# Patient Record
Sex: Female | Born: 1950
Health system: Southern US, Community
[De-identification: ages and names within clinical notes are randomized; demographics above are authoritative.]

## PROBLEM LIST (undated history)

## (undated) DIAGNOSIS — N952 Postmenopausal atrophic vaginitis: Secondary | ICD-10-CM

## (undated) DIAGNOSIS — F419 Anxiety disorder, unspecified: Secondary | ICD-10-CM

## (undated) DIAGNOSIS — C50411 Malignant neoplasm of upper-outer quadrant of right female breast: Secondary | ICD-10-CM

## (undated) DIAGNOSIS — Z803 Family history of malignant neoplasm of breast: Secondary | ICD-10-CM

## (undated) DIAGNOSIS — E7800 Pure hypercholesterolemia, unspecified: Secondary | ICD-10-CM

## (undated) DIAGNOSIS — M8580 Other specified disorders of bone density and structure, unspecified site: Secondary | ICD-10-CM

## (undated) DIAGNOSIS — M1990 Unspecified osteoarthritis, unspecified site: Secondary | ICD-10-CM

## (undated) HISTORY — PX: OTHER SURGICAL HISTORY: SHX169

## (undated) HISTORY — DX: Pure hypercholesterolemia, unspecified: E78.00

## (undated) HISTORY — PX: KNEE SURGERY: SHX244

## (undated) HISTORY — DX: Other specified disorders of bone density and structure, unspecified site: M85.80

## (undated) HISTORY — DX: Postmenopausal atrophic vaginitis: N95.2

## (undated) HISTORY — DX: Family history of malignant neoplasm of breast: Z80.3

## (undated) HISTORY — PX: TUBAL LIGATION: SHX77

## (undated) HISTORY — DX: Unspecified osteoarthritis, unspecified site: M19.90

## (undated) HISTORY — PX: BREAST SURGERY: SHX581

## (undated) HISTORY — DX: Malignant neoplasm of upper-outer quadrant of right female breast: C50.411

---

## 1999-03-30 ENCOUNTER — Other Ambulatory Visit: Admission: RE | Admit: 1999-03-30 | Discharge: 1999-03-30 | Payer: Self-pay | Admitting: Obstetrics and Gynecology

## 2000-03-29 ENCOUNTER — Other Ambulatory Visit: Admission: RE | Admit: 2000-03-29 | Discharge: 2000-03-29 | Payer: Self-pay | Admitting: Obstetrics and Gynecology

## 2001-04-11 ENCOUNTER — Other Ambulatory Visit: Admission: RE | Admit: 2001-04-11 | Discharge: 2001-04-11 | Payer: Self-pay | Admitting: Obstetrics and Gynecology

## 2002-04-11 ENCOUNTER — Other Ambulatory Visit: Admission: RE | Admit: 2002-04-11 | Discharge: 2002-04-11 | Payer: Self-pay | Admitting: Obstetrics and Gynecology

## 2002-10-17 ENCOUNTER — Encounter: Payer: Self-pay | Admitting: Family Medicine

## 2002-10-17 ENCOUNTER — Encounter: Admission: RE | Admit: 2002-10-17 | Discharge: 2002-10-17 | Payer: Self-pay | Admitting: Family Medicine

## 2003-01-19 ENCOUNTER — Encounter: Admission: RE | Admit: 2003-01-19 | Discharge: 2003-01-19 | Payer: Self-pay | Admitting: Family Medicine

## 2003-01-19 ENCOUNTER — Encounter: Payer: Self-pay | Admitting: Family Medicine

## 2003-04-21 ENCOUNTER — Other Ambulatory Visit: Admission: RE | Admit: 2003-04-21 | Discharge: 2003-04-21 | Payer: Self-pay | Admitting: Obstetrics and Gynecology

## 2004-05-03 ENCOUNTER — Other Ambulatory Visit: Admission: RE | Admit: 2004-05-03 | Discharge: 2004-05-03 | Payer: Self-pay | Admitting: Obstetrics and Gynecology

## 2005-05-11 ENCOUNTER — Other Ambulatory Visit: Admission: RE | Admit: 2005-05-11 | Discharge: 2005-05-11 | Payer: Self-pay | Admitting: Obstetrics and Gynecology

## 2006-06-04 ENCOUNTER — Other Ambulatory Visit: Admission: RE | Admit: 2006-06-04 | Discharge: 2006-06-04 | Payer: Self-pay | Admitting: Obstetrics and Gynecology

## 2007-06-06 ENCOUNTER — Other Ambulatory Visit: Admission: RE | Admit: 2007-06-06 | Discharge: 2007-06-06 | Payer: Self-pay | Admitting: Obstetrics and Gynecology

## 2008-06-23 ENCOUNTER — Ambulatory Visit: Payer: Self-pay | Admitting: Obstetrics and Gynecology

## 2008-06-23 ENCOUNTER — Encounter: Payer: Self-pay | Admitting: Obstetrics and Gynecology

## 2008-06-23 ENCOUNTER — Other Ambulatory Visit: Admission: RE | Admit: 2008-06-23 | Discharge: 2008-06-23 | Payer: Self-pay | Admitting: Obstetrics and Gynecology

## 2009-06-24 ENCOUNTER — Other Ambulatory Visit: Admission: RE | Admit: 2009-06-24 | Discharge: 2009-06-24 | Payer: Self-pay | Admitting: Obstetrics and Gynecology

## 2009-06-24 ENCOUNTER — Ambulatory Visit: Payer: Self-pay | Admitting: Obstetrics and Gynecology

## 2010-01-27 ENCOUNTER — Ambulatory Visit: Payer: Self-pay | Admitting: Obstetrics and Gynecology

## 2010-03-14 ENCOUNTER — Other Ambulatory Visit: Admission: RE | Admit: 2010-03-14 | Discharge: 2010-03-14 | Payer: Self-pay | Admitting: Obstetrics and Gynecology

## 2010-03-14 ENCOUNTER — Ambulatory Visit: Payer: Self-pay | Admitting: Obstetrics and Gynecology

## 2011-05-03 ENCOUNTER — Other Ambulatory Visit: Payer: Self-pay | Admitting: *Deleted

## 2011-05-03 NOTE — Telephone Encounter (Signed)
Patient called requesting a refill on two meds she had been on in 2009.  The meds are Lexapro 10mg  and Ativan 0.5mg .  Was taking both daily and had gotten off of them.  Now is very stressed at work, lots of anxiety, new boss, not sleeping.  Has upcoming Annual scheduled for November.  Would like to go back on these please.  She does have an appointment with a counselor in a couple weeks too. Please advise

## 2011-05-04 MED ORDER — LORAZEPAM 0.5 MG PO TABS: 0.5000 mg | ORAL_TABLET | Freq: Three times a day (TID) | ORAL | Status: AC | PRN

## 2011-05-04 MED ORDER — ESCITALOPRAM OXALATE 10 MG PO TABS: 10.0000 mg | ORAL_TABLET | Freq: Every day | ORAL | Status: DC

## 2011-05-04 NOTE — Telephone Encounter (Signed)
It is okay to give her both.

## 2011-05-04 NOTE — Telephone Encounter (Signed)
rx's called in patient informed.

## 2011-06-06 DIAGNOSIS — N952 Postmenopausal atrophic vaginitis: Secondary | ICD-10-CM | POA: Insufficient documentation

## 2011-06-06 DIAGNOSIS — M8580 Other specified disorders of bone density and structure, unspecified site: Secondary | ICD-10-CM | POA: Insufficient documentation

## 2011-06-14 ENCOUNTER — Ambulatory Visit (INDEPENDENT_AMBULATORY_CARE_PROVIDER_SITE_OTHER): Payer: BC Managed Care – PPO | Admitting: Obstetrics and Gynecology

## 2011-06-14 ENCOUNTER — Encounter: Payer: Self-pay | Admitting: Obstetrics and Gynecology

## 2011-06-14 VITALS — BP 120/74 | Ht 65.0 in | Wt 136.0 lb

## 2011-06-14 DIAGNOSIS — Z01419 Encounter for gynecological examination (general) (routine) without abnormal findings: Secondary | ICD-10-CM

## 2011-06-14 MED ORDER — ESCITALOPRAM OXALATE 10 MG PO TABS: 10.0000 mg | ORAL_TABLET | Freq: Every day | ORAL | Status: DC

## 2011-06-14 NOTE — Progress Notes (Signed)
Patient came to see me today for her annual GYN exam. She does have atrophic vaginitis but is fine without intervention. She is osteopenia without an elevated FRAX risk. She takes calcium and vitamin D. She exercises. She's had no fractures. She's been under a lot of stress both at work and with family health issues. As a result she has lost some weight. We have started her on Lexapro and she's been on a month now. She thinks to helping. She's been able to taper her Ativan. She is not having vaginal bleeding or pelvic pain. She is fine without HRT. She is scheduled for yearly mammogram.  Physical examination:  Tina Nielsen present. HEENT within normal limits. Neck: Thyroid not large. No masses. Supraclavicular nodes: not enlarged. Breasts: Examined in both sitting midline position. No skin changes and no masses. Abdomen: Soft no guarding rebound or masses or hernia. Pelvic: External: Within normal limits. BUS: Within normal limits. Vaginal:within normal limits. poor estrogen effect. No evidence of cystocele rectocele or enterocele. Cervix: clean. Uterus: Normal size and shape. Adnexa: No masses. Rectovaginal exam: Confirmatory and negative. Extremities: Within normal limits.  Assessment: #1. Atrophic vaginitis #2. Osteopenia  Plan: Continue calcium and vitamin D. Followup bone density next year. Continue yearly mammograms.

## 2011-07-13 ENCOUNTER — Encounter: Payer: Self-pay | Admitting: Obstetrics and Gynecology

## 2011-07-22 ENCOUNTER — Other Ambulatory Visit: Payer: Self-pay | Admitting: Obstetrics and Gynecology

## 2011-07-24 NOTE — Telephone Encounter (Signed)
rx called in

## 2011-09-04 ENCOUNTER — Other Ambulatory Visit: Payer: Self-pay | Admitting: Women's Health

## 2011-09-04 NOTE — Telephone Encounter (Signed)
rx called in

## 2011-10-04 ENCOUNTER — Other Ambulatory Visit: Payer: Self-pay | Admitting: Women's Health

## 2011-10-05 NOTE — Telephone Encounter (Signed)
Phoned in to pharmacy. 

## 2011-11-06 ENCOUNTER — Other Ambulatory Visit: Payer: Self-pay | Admitting: Women's Health

## 2011-11-06 NOTE — Telephone Encounter (Signed)
Last fill date 10/05/11 and original RX Nov 2012, Dr Reece Agar

## 2011-12-04 ENCOUNTER — Ambulatory Visit (INDEPENDENT_AMBULATORY_CARE_PROVIDER_SITE_OTHER): Payer: BC Managed Care – PPO | Admitting: Obstetrics and Gynecology

## 2011-12-04 DIAGNOSIS — N63 Unspecified lump in unspecified breast: Secondary | ICD-10-CM

## 2011-12-04 NOTE — Progress Notes (Signed)
Patient came to see me today with a lump in her left breast. For 2 months now she feels something when she rolls over on her left side. Self-exam however failed to reveal masses. However on Sunday she was aware of something on breast exam.  Exam: Tina Batten Nielsen  Present. Both breasts examined in the sitting and lying position. Her left breast is clearly more lumpy than the right especially under her nipple. I do not however feel the dominant lesion.  Assessment: Possible left breast mass  Plan: Diagnostic mammal an ultrasound.

## 2011-12-05 ENCOUNTER — Telehealth: Payer: Self-pay | Admitting: *Deleted

## 2011-12-05 DIAGNOSIS — N6320 Unspecified lump in the left breast, unspecified quadrant: Secondary | ICD-10-CM

## 2011-12-05 NOTE — Telephone Encounter (Signed)
Message copied by Mckinley Jewel Bernadette Armijo L on Tue Dec 05, 2011 12:48 PM ------      Message from: Trellis Paganini      Created: Mon Dec 04, 2011  4:35 PM       Patient has a lump in her left breast below the nipple. Schedule her for a diagnostic mammos and ultrasound at [facility]. She needs a late afternoon time.

## 2011-12-05 NOTE — Telephone Encounter (Signed)
Lm for patient with information

## 2011-12-05 NOTE — Telephone Encounter (Signed)
Lm for patient to call.  Appt set up at [facility] for 12/11/11 @ 3pm. Order faxed.

## 2011-12-06 ENCOUNTER — Other Ambulatory Visit: Payer: Self-pay | Admitting: Obstetrics and Gynecology

## 2011-12-07 NOTE — Telephone Encounter (Signed)
rx called in

## 2011-12-12 ENCOUNTER — Other Ambulatory Visit: Payer: Self-pay | Admitting: Obstetrics and Gynecology

## 2011-12-12 DIAGNOSIS — N6320 Unspecified lump in the left breast, unspecified quadrant: Secondary | ICD-10-CM

## 2012-03-18 ENCOUNTER — Other Ambulatory Visit: Payer: Self-pay | Admitting: Dermatology

## 2012-06-04 ENCOUNTER — Ambulatory Visit: Payer: Self-pay | Admitting: Sports Medicine

## 2012-06-04 ENCOUNTER — Ambulatory Visit (INDEPENDENT_AMBULATORY_CARE_PROVIDER_SITE_OTHER): Payer: BC Managed Care – PPO | Admitting: Sports Medicine

## 2012-06-04 ENCOUNTER — Encounter: Payer: Self-pay | Admitting: Sports Medicine

## 2012-06-04 VITALS — BP 124/79 | HR 66 | Ht 65.0 in | Wt 135.0 lb

## 2012-06-04 DIAGNOSIS — M7660 Achilles tendinitis, unspecified leg: Secondary | ICD-10-CM

## 2012-06-04 MED ORDER — NITROGLYCERIN 0.2 MG/HR TD PT24: MEDICATED_PATCH | TRANSDERMAL | Status: DC

## 2012-06-04 NOTE — Progress Notes (Signed)
  Subjective:    Patient ID: Tina Nielsen, female    DOB: 01-Dec-1950, 61 y.o.   MRN: [id number]  HPI 61 YO female presents for evaluation of bilateral heel pain.  R heel since May and L heel since Aug.  Now L > R.  Describes a "stiff ache" near her achilles.  Worse first thing in am and after activity.  Has tried ice and NSAIDs with little relief so far.  Given Voltaren gel by Dr. Cleophas Dunker (ortho) recently.   No recent change in activity level or shoe wear.  Came to todays appointment at the recommendation of a friend. She was also given a steroid Dosepak in May that alleviated some of the pain but only for short period of time  The Voltaren gel she received a few days ago did help with her pain  PMH: Hyperlipidema Arthritis Roseca NO Diabetes, Renal or Thyroid dz  PSH: No surgery over either foot  Social:   35 pack years.  Quit. Occasional ETOH HS teacher.   Review of Systems Gen: No F/C MSK: Per HPI Neuro: No paresthesias.     Objective:   Physical Exam Gen: Alert. NAD. MSK: Noted nodularity at the mid-substance of the achilles tendon bilaterally.  Mild TTP of achilles tendon.  Slight crepitance with motion.  Near full ROM.  5/5 strength.   Neurovascular: sensation intact to light touch.  2/4 DP and PT pulses.  US findings:  The right Achilles tendon has a nodule measuring 0.9 starting at 6 cm above the calcaneus. This nodule shows marked calcific change. There are multiple neo-vessels running throughout the nodule. On transverse scans there are hypoechoic changes suggesting partial intrasubstance tears  The left Achilles tendon shows a nodule measuring 1.2 cm starting 7 cm above the calcaneus There is marked neovascular activity. There are calcific changes noted. On transverse scan there is a large midsubstance tear and other hypoechoic changes.  Note both Achilles tendons look normal at the insertion  Assessment & Plan:  1.  Bilateral Achilles Tendonopathy  Recommended NTG patch.  R/B/A discussed including HAs.  Provided compression sleeve and small heel lift.  Given eccentric exercise program.  F/u in 1 month.      Riki Rusk L. Katherine Roan, DO PGY 3 FM Resident

## 2012-06-04 NOTE — Assessment & Plan Note (Signed)
This is severe and suggests that she is at higher risk of Achilles tendon rupture  Am not sure of the etiologic factor but I have some concern that maybe her use of atorvastatin contributed  Try compression sleeve on one ankle and see if this helps. If so add to the second ankle.  Heel lifts. Nitroglycerin protocol.  Gentle exercise program but do it twice daily.  Recheck in one month

## 2012-06-04 NOTE — Patient Instructions (Addendum)
Nitroglycerin Protocol   Apply 1/4 nitroglycerin patch to each achilles tendon daily.  Change position of patch within the affected area every 24 hours.  You may experience a headache during the first 1-2 weeks of using the patch, these should subside.  If you experience headaches after beginning nitroglycerin patch treatment, you may take your preferred over the counter pain reliever.  Another side effect of the nitroglycerin patch is skin irritation or rash related to patch adhesive.  Please notify our office if you develop more severe headaches or rash, and stop the patch.  Tendon healing with nitroglycerin patch may require 12 to 24 weeks depending on the extent of injury.  Men should not use if taking Viagra, Cialis, or Levitra.   Do not use if you have migraines or rosacea.   Please do suggested exercises daily  Please follow up in 1 month  Thank you for seeing [place] today!

## 2012-06-14 ENCOUNTER — Other Ambulatory Visit: Payer: Self-pay | Admitting: Obstetrics and Gynecology

## 2012-06-20 ENCOUNTER — Encounter: Payer: Self-pay | Admitting: Obstetrics and Gynecology

## 2012-06-20 ENCOUNTER — Ambulatory Visit (INDEPENDENT_AMBULATORY_CARE_PROVIDER_SITE_OTHER): Payer: BC Managed Care – PPO | Admitting: Obstetrics and Gynecology

## 2012-06-20 ENCOUNTER — Other Ambulatory Visit (HOSPITAL_COMMUNITY)
Admission: RE | Admit: 2012-06-20 | Discharge: 2012-06-20 | Disposition: A | Discharge status: Home / Self Care | Payer: BC Managed Care – PPO | Source: Ambulatory Visit | Attending: Obstetrics and Gynecology | Admitting: Obstetrics and Gynecology

## 2012-06-20 VITALS — BP 106/70 | Ht 65.0 in | Wt 136.0 lb

## 2012-06-20 DIAGNOSIS — Z01419 Encounter for gynecological examination (general) (routine) without abnormal findings: Secondary | ICD-10-CM

## 2012-06-20 NOTE — Patient Instructions (Addendum)
Call cancer center and inqjuire about BRAC-1 and BRAc-2 testing for mother. Get 3-D mammogram

## 2012-06-20 NOTE — Progress Notes (Signed)
Patient came to see me today for her annual GYN exam. She is doing well without HRT. She does have some anxiety but feels it is work related. She does her lab through her internist. She is due for followup bone density. She has osteopenia without  elevated FRAX risk. She has had no fractures.She is having no vaginal bleeding. She is having no pelvic pain. She does have vaginal dryness but is currently not sexually active and does not need treatment. She is due for her mammogram in December. She does have dense breast tissue. Her mother had breast cancer at age 26. She is still alive and older than 40. There is no other family history of breast or ovarian cancer.  Physical examination:Tina Nielsen present. HEENT within normal limits. Neck: Thyroid not large. No masses. Supraclavicular nodes: not enlarged. Breasts: Examined in both sitting and lying  position. No skin changes and no masses. Abdomen: Soft no guarding rebound or masses or hernia. Pelvic: External: Within normal limits. BUS: Within normal limits. Vaginal:within normal limits. Poor  estrogen effect. No evidence of cystocele  or enterocele. First-degree rectocele.Cervix: clean. Uterus: Normal size and shape. Adnexa: No masses. Rectovaginal exam: Confirmatory and negative. Extremities: Within normal limits.  Assessment: #1. Atrophic vaginitis #2. Small asymptomatic rectocele #3. Dense breast tissue. #4. Osteopenia  Plan: Mammogram-3D. Bone density. Patient to inquire at cancer Center regarding mother been tested for BRCA1 and BRCA2. Pap done.The new Pap smear guidelines were discussed with the patient.

## 2012-06-21 LAB — URINALYSIS W MICROSCOPIC + REFLEX CULTURE
Casts: NONE SEEN
Crystals: NONE SEEN
Glucose, UA: NEGATIVE mg/dL
Leukocytes, UA: NEGATIVE
Squamous Epithelial / LPF: NONE SEEN
pH: 6 (ref 5.0–8.0)

## 2012-07-03 ENCOUNTER — Ambulatory Visit (INDEPENDENT_AMBULATORY_CARE_PROVIDER_SITE_OTHER): Payer: BC Managed Care – PPO | Admitting: Sports Medicine

## 2012-07-03 VITALS — BP 106/64 | Ht 65.0 in | Wt 140.0 lb

## 2012-07-03 DIAGNOSIS — M7660 Achilles tendinitis, unspecified leg: Secondary | ICD-10-CM

## 2012-07-03 NOTE — Patient Instructions (Signed)
Suggested exercises 1. Heel walking back and forth for 12-15 steps 5x at least twice a day 2. Stair heel raises with leg straight and then bent 15 reps and 3 sets. Do not do anything that causes you pain.   Continue Nitroglycerin Protocol   Apply 1/4 nitroglycerin patch to each achilles tendon daily.  Change position of patch within the affected area every 24 hours.  You may experience a headache during the first 1-2 weeks of using the patch, these should subside.  If you experience headaches after beginning nitroglycerin patch treatment, you may take your preferred over the counter pain reliever.  Another side effect of the nitroglycerin patch is skin irritation or rash related to patch adhesive.  Please notify our office if you develop more severe headaches or rash, and stop the patch.  Tendon healing with nitroglycerin patch may require 12 to 24 weeks depending on the extent of injury.  Men should not use if taking Viagra, Cialis, or Levitra.   Do not use if you have migraines or rosacea.   Please follow up in 2 months or sooner if pain were to worsen or not continue to improve.   Thank you for seeing Tina Nielsen today!

## 2012-07-03 NOTE — Progress Notes (Signed)
  Subjective:    Patient ID: Tina Nielsen, female    DOB: October 26, 1950, 61 y.o.   MRN: [id number]  HPI 61 YO female presents for follow up of bilateral heel pain.  Patient diagnosed with achilles tendonopathy with partial tears on bilateral achilles tendons. Patient seen 4 weeks ago and started on NTG protocol with eccentric exercises. Could not tolerate NTG due to HA and reverted back to Voltaren which did not help pain. 5 days ago patient started NTG again in AM and states that she has only had some nausea but no HA and has been able to tolerate. In the last week since starting NTG, pain has improved by 75%. Patient excited about results. She also does intermittent aleve as well as ice baths occasionally. Patient admits only intermittently compliant with exercises.   PMH: 7 pack years.  Quit. Review of Systems-see HPI     Objective:   Physical Exam Gen: Alert. NAD. MSK: Noted nodularity at the mid-substance of the achilles tendon bilaterally.  Mild TTP of achilles tendon bilatearlly.  Slight crepitance with motion.  Full ROM.  5/5 strength.   Neurovascular: sensation intact to light touch.  2/4 DP and PT pulses.  US findings:  The right Achilles tendon has a nodule measuring 0.76 improved from 0.9 at last reading starting at 6 cm above the calcaneus. This nodule shows marked calcific change. Neo-vessels still present but minimal in comparison to last imaging.  On transverse scans there are hypoechoic changes suggesting partial intrasubstance tears-appear to be smaller/healing  The left Achilles tendon shows a nodule measuring 1.09 cm down from 1.2 cm starting 7 cm above the calcaneus. Continues to have neovascular activity but less in comparison to old study. There are calcific changes noted. On transverse scan there is a large midsubstance tear and other hypoechoic changes which appears smaller than last visit/healing.   Please note both Achilles tendons look normal at the insertion    Assessment & Plan:  1.  Bilateral Achilles Tendonopathy Healing with NTG therapy. Will continue therapy and eccentric exercises (heel lifts including intoeing and toe walking) . Patient to continue ankle sleeve. Gave patient 2 extra pair of heel lifts as these are helping. Patient will plan to follow up in 2 months as long as she continues to improve and does not worsen.

## 2012-07-25 ENCOUNTER — Encounter: Payer: Self-pay | Admitting: Obstetrics and Gynecology

## 2012-09-03 ENCOUNTER — Ambulatory Visit (INDEPENDENT_AMBULATORY_CARE_PROVIDER_SITE_OTHER): Payer: 59 | Admitting: Sports Medicine

## 2012-09-03 VITALS — BP 110/70 | Ht 65.0 in | Wt 140.0 lb

## 2012-09-03 DIAGNOSIS — M7660 Achilles tendinitis, unspecified leg: Secondary | ICD-10-CM

## 2012-09-03 NOTE — Progress Notes (Signed)
Patient ID: Tina Nielsen, female   DOB: 1950/11/11, 62 y.o.   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 She is able to do the exercises mostly on one leg without any significant pain She has not had side effects from the nitroglycerin  Physical examination Right Achilles tendon still shows a nodule but is much smaller than before and seems relatively nontender There is good motion of the ankle dorsi and plantar flexion  Left Achilles tendon reveals a substantial nodule starting about 3 cm above the calcaneus and extending to about 7 cm This is no longer painful to touch I do not see any reduction in size   Musculoskeletal ultrasound The right Achilles tendon continues to show progress in the nodule has now reduced to about 0.69 cm thick There are no tears seen on longitudinal or transverse scan There is only very mild increase in Doppler activity  The left Achilles tendon shows a very thick nodule This still measures 1.3 cm at its greatest thickness There is a marked increase in Doppler activity throughout the nodule On transverse and longitudinal scans there are areas of splitting within the tendon and some intratendinous tears

## 2012-09-03 NOTE — Assessment & Plan Note (Signed)
Clinically she continues to make very nice progress she has a long way to go to heal the left Achilles  I suggested continuing the exercises faithfully  Working to get these to her she is very comfortable on 1 leg  Continue on the nitroglycerin and I suspect she will need to use this for 90 months to-year-old left Achilles  We will recheck in 3 months and by that time may be able to stop this on the right Achilles  Return sooner if any problems

## 2012-10-07 ENCOUNTER — Other Ambulatory Visit: Payer: Self-pay | Admitting: *Deleted

## 2012-10-07 MED ORDER — NITROGLYCERIN 0.2 MG/HR TD PT24: MEDICATED_PATCH | TRANSDERMAL | Status: DC

## 2012-12-03 ENCOUNTER — Ambulatory Visit (INDEPENDENT_AMBULATORY_CARE_PROVIDER_SITE_OTHER): Payer: 59 | Admitting: Sports Medicine

## 2012-12-03 ENCOUNTER — Encounter: Payer: Self-pay | Admitting: Sports Medicine

## 2012-12-03 VITALS — BP 138/85 | HR 63 | Ht 65.0 in | Wt 140.0 lb

## 2012-12-03 DIAGNOSIS — M7660 Achilles tendinitis, unspecified leg: Secondary | ICD-10-CM

## 2012-12-03 NOTE — Assessment & Plan Note (Signed)
She seems to be gradually healing both Achilles tendons  The right Achilles she can probably stop nitroglycerin within another month  The left Achilles tendon she can increase to one half patch I suspect the left Achilles tendon will take a year or more to resolve  Keep up the exercises at least once a day  Recheck in 2 months and probable ultrasound at that time

## 2012-12-03 NOTE — Progress Notes (Signed)
  Subjective:    Patient ID: Maryann Conners, female    DOB: Jun 30, 1951, 62 y.o.   MRN: [id number]  HPI  Pt presents to clinic for f/u of bilat achilles tendinitis. Rt feels resolved, left much improved. Continues to use 1/4 NTG patch on both side. Does home exercises consistently. Only has slight pain with lt AT now.    Review of Systems     Objective:   Physical Exam No acute distress, thin white female  Lt AT  nodule slightly tender to squeeze 3-7 cm above calcaneus - thickened nodular AT- not red or warm, does not move Still about 1 cm thick Motion at ankle good Slight swelling around distal soleus  Rt AT Nodule down to almost normal- start 4 cm above calcaneus, moves easily Motion at ankle good  Able to do toe and heel walking without pain      Assessment & Plan:

## 2012-12-03 NOTE — Patient Instructions (Addendum)
Increase to 1/2 patch on left achilles nodule  Continue 1/4 patch on rt achilles   Continue home exercises   Please follow up in 2 months  Thank you for seeing [place] today!

## 2013-01-23 ENCOUNTER — Other Ambulatory Visit: Payer: Self-pay | Admitting: *Deleted

## 2013-01-23 MED ORDER — NITROGLYCERIN 0.2 MG/HR TD PT24: MEDICATED_PATCH | TRANSDERMAL | Status: DC

## 2013-04-01 ENCOUNTER — Ambulatory Visit (INDEPENDENT_AMBULATORY_CARE_PROVIDER_SITE_OTHER): Payer: 59 | Admitting: Sports Medicine

## 2013-04-01 ENCOUNTER — Encounter: Payer: Self-pay | Admitting: Sports Medicine

## 2013-04-01 VITALS — BP 105/67 | HR 61 | Ht 65.0 in | Wt 140.0 lb

## 2013-04-01 DIAGNOSIS — M7660 Achilles tendinitis, unspecified leg: Secondary | ICD-10-CM

## 2013-04-01 MED ORDER — NITROGLYCERIN 0.2 MG/HR TD PT24: MEDICATED_PATCH | TRANSDERMAL | Status: DC

## 2013-04-01 NOTE — Assessment & Plan Note (Signed)
Pt continues to make progress, almost nml appearing achilles tendon on the R side.  She can stop the nitro patch on this side.  On the left side, minimal TTP and has been making improvement as US shows 0.97 at thickest point which is a decrease from 1.3 on last scan.  Will continue the 1/2 patch of NTG and recheck in three months.  Continue with eccentric exercises and mild heel lift to unload the achilles.  The L side may well take over a yr to heal.

## 2013-04-01 NOTE — Progress Notes (Signed)
Tina Nielsen is a 62 y.o. female who presents today for f/u of her B/L achilles calcific tendonopathy.  Pt was started on the NTG protocol about 7-8 months ago and her last f/u was on 12/03/12.  She was continued on 1/2 patch B/L at that time to each achilles and at this point states that her pain is very minimal.  She has intermittent pain at the left achilles, close to the insertion, and intermittent pain with walking when pushing off.  She denies dull achy pain to the area, swelling, erythema, and she has been compliant with her achilles eccentric stretching exercises and the nitro patches.   ROS: Per HPI.  All other systems reviewed and are negative.   Physical Exam Filed Vitals:   04/01/13 1610  BP: 105/67  Pulse: 61    Physical Examination:  No acute distress  Lt AT  Mild nodule slightly above calcaneal insertion 3-7 cm, no erythematous, minimal TTP at this region, ROM nml, MS 5/5, Thompson test neg  Rt AT  Minimal nodule around 4 cm above calcaneus, moves easily. No TTP, ROM nml, MS 5/5, Thompson test neg  Able to do toe and heel walking without pain. Neurovascular intact    MSK [place]  Rt achilles Long - Calcifications seen throughout tendon, w/o hypoechoic areas noted, no discrete tears. 0.49 cm thick around insertion.  0.70 cm at thickest point Transverse - Calcifications as above There is only very mild increase in Doppler activity   Lt achilles Long - Large calcified area 2 cm proximal to insertion w/ other areas of calcification noted.  No discrete tears.  0.97 cm thickest point Transverse -  there are areas of splitting within the tendon and some intratendinous tears along with fusiform appearance, minimal increase in doppler activity

## 2013-05-01 ENCOUNTER — Encounter: Payer: Self-pay | Admitting: Internal Medicine

## 2013-05-20 ENCOUNTER — Encounter: Payer: Self-pay | Admitting: Internal Medicine

## 2013-07-02 ENCOUNTER — Encounter (INDEPENDENT_AMBULATORY_CARE_PROVIDER_SITE_OTHER): Payer: Self-pay

## 2013-07-02 ENCOUNTER — Ambulatory Visit (INDEPENDENT_AMBULATORY_CARE_PROVIDER_SITE_OTHER): Payer: 59 | Admitting: Sports Medicine

## 2013-07-02 ENCOUNTER — Encounter: Payer: Self-pay | Admitting: Sports Medicine

## 2013-07-02 VITALS — BP 111/75 | HR 65 | Ht 65.0 in | Wt 140.0 lb

## 2013-07-02 DIAGNOSIS — M7660 Achilles tendinitis, unspecified leg: Secondary | ICD-10-CM

## 2013-07-02 NOTE — Assessment & Plan Note (Signed)
Continued improvement with tendon issues now only on left See plan

## 2013-07-02 NOTE — Progress Notes (Signed)
Patient ID: Tina Nielsen, female   DOB: 11-20-1950, 62 y.o.   MRN: [id number]  SUBJECTIVE: Patient is a 62 yo female who presents today for follow up bilateral L>R achilles tendinitis What has been treating this condition with Nitroglycerin patch on both side for several months 9 month on the Right achilles and currently 13 months on the left. 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 She continue to wear a heel lift and has been continuing her home exercise program daily  The right-sided nodule had improved in appearance The left-sided nodule was still large but not as TTP She has not had side effects from the nitroglycerin   ROS: negative except what is present in HPI  Physical examination: BP 111/75, HR 65, wt  Neuro: A&O x3.  CN 2-12 grossly intact.  Sensation to light touch lower extremities equal and intact bilaterally.  Right foot dominate Normal gait Foot/Ankle Exam:  observation - no ecchymosis, no edema, or hematoma present over the anterior, lateral or posterior soft tissue surrounding the ankle and midfoot Palpation: No tenderness to palpation over the ATF, CF, or PTF ligaments.  No tenderness to palpation over the deltoid ligaments both anterior lateral and posterior portions.  No tenderness to palpation over the lateral or medial epicondyles.  No tenderness over the syndesmosis.  Right Achilles tendon still shows a nodule but is much smaller than before and seems relatively non-tender.  Left Achilles tendon reveals a thick nodule starting about 3 cm above the calcaneus  This is no longer painful to touch There is good motion of the ankle dorsi and plantar flexion. Strength 5/5 on plantar flexion, dorsi flexion inversion and eversion, bilateral  Musculoskeletal ultrasound The left Achilles tendon shows a very thick nodule Reduction from 0.97 to 0.76 cm at its greatest thickness There is less increase in  Doppler activity throughout the nodule Continue calcific changes within the tendon but less tear of the tendon fibers On transverse and longitudinal scans there are areas of splitting within the tendon and some intratendinous tears  ASSESSMENT/PLAN: 1. Bilateral Left great than Right Achilles tendinitis with left sided calcific changes seen on [place]  Continue Nitroglycerin protocol on the left achilles f/u in 3-4 months goal is for the thickness of the left tendon to reduce down to a normal upper range of 0.6 cm

## 2013-07-14 ENCOUNTER — Ambulatory Visit (AMBULATORY_SURGERY_CENTER): Payer: 59 | Admitting: *Deleted

## 2013-07-14 VITALS — Ht 65.0 in | Wt 144.0 lb

## 2013-07-14 DIAGNOSIS — Z1211 Encounter for screening for malignant neoplasm of colon: Secondary | ICD-10-CM

## 2013-07-14 MED ORDER — MOVIPREP 100 G PO SOLR: ORAL | Status: DC

## 2013-07-14 NOTE — Progress Notes (Signed)
Patient denies any allergies to eggs or soy. Patient denies any problems with anesthesia.  

## 2013-07-16 ENCOUNTER — Encounter: Payer: Self-pay | Admitting: Internal Medicine

## 2013-07-28 ENCOUNTER — Ambulatory Visit (AMBULATORY_SURGERY_CENTER): Payer: 59 | Admitting: Internal Medicine

## 2013-07-28 ENCOUNTER — Encounter: Payer: Self-pay | Admitting: Internal Medicine

## 2013-07-28 VITALS — BP 133/60 | HR 52 | Temp 98.0°F | Resp 22 | Ht 65.0 in | Wt 144.0 lb

## 2013-07-28 DIAGNOSIS — Z1211 Encounter for screening for malignant neoplasm of colon: Secondary | ICD-10-CM

## 2013-07-28 MED ORDER — SODIUM CHLORIDE 0.9 % IV SOLN: 500.0000 mL | INTRAVENOUS | Status: DC

## 2013-07-28 NOTE — Progress Notes (Signed)
No complaints noted in the recovery room. Maw   

## 2013-07-28 NOTE — Progress Notes (Signed)
Procedure ends, to recovery, report given and VSS. 

## 2013-07-28 NOTE — Patient Instructions (Signed)
YOU HAD AN ENDOSCOPIC PROCEDURE TODAY AT THE [facility]: Refer to the procedure report that was given to you for any specific questions about what was found during the examination.  If the procedure report does not answer your questions, please call your gastroenterologist to clarify.  If you requested that your care partner not be given the details of your procedure findings, then the procedure report has been included in a sealed envelope for you to review at your convenience later.  YOU SHOULD EXPECT: Some feelings of bloating in the abdomen. Passage of more gas than usual.  Walking can help get rid of the air that was put into your GI tract during the procedure and reduce the bloating. If you had a lower endoscopy (such as a colonoscopy or flexible sigmoidoscopy) you may notice spotting of blood in your stool or on the toilet paper. If you underwent a bowel prep for your procedure, then you may not have a normal bowel movement for a few days.  DIET: Your first meal following the procedure should be a light meal and then it is ok to progress to your normal diet.  A half-sandwich or bowl of soup is an example of a good first meal.  Heavy or fried foods are harder to digest and may make you feel nauseous or bloated.  Likewise meals heavy in dairy and vegetables can cause extra gas to form and this can also increase the bloating.  Drink plenty of fluids but you should avoid alcoholic beverages for 24 hours.  ACTIVITY: Your care partner should take you home directly after the procedure.  You should plan to take it easy, moving slowly for the rest of the day.  You can resume normal activity the day after the procedure however you should NOT DRIVE or use heavy machinery for 24 hours (because of the sedation medicines used during the test).    SYMPTOMS TO REPORT IMMEDIATELY: A gastroenterologist can be reached at any hour.  During normal business hours, 8:30 AM to 5:00 PM Monday through Friday,  call [phone].  After hours and on weekends, please call the GI answering service at [phone] who will take a message and have the physician on call contact you.   Following lower endoscopy (colonoscopy or flexible sigmoidoscopy):  Excessive amounts of blood in the stool  Significant tenderness or worsening of abdominal pains  Swelling of the abdomen that is new, acute  Fever of 100F or higher    FOLLOW UP: If any biopsies were taken you will be contacted by phone or by letter within the next 1-3 weeks.  Call your gastroenterologist if you have not heard about the biopsies in 3 weeks.  Our staff will call the home number listed on your records the next business day following your procedure to check on you and address any questions or concerns that you may have at that time regarding the information given to you following your procedure. This is a courtesy call and so if there is no answer at the home number and we have not heard from you through the emergency physician on call, we will assume that you have returned to your regular daily activities without incident.  SIGNATURES/CONFIDENTIALITY: You and/or your care partner have signed paperwork which will be entered into your electronic medical record.  These signatures attest to the fact that that the information above on your After Visit Summary has been reviewed and is understood.  Full responsibility of the confidentiality   of this discharge information lies with you and/or your care-partner.   Normal colonoscopy, repeat in 10 years-2024  High fiber diet.

## 2013-07-28 NOTE — Op Note (Signed)
[facility] [address] [place], [address]   COLONOSCOPY PROCEDURE REPORT  PATIENT: Tina Nielsen, Tina Nielsen  MR#: [id number] BIRTHDATE: Sep 26, 1950 , 62  yrs. old GENDER: Female ENDOSCOPIST: Hart Carwin, MD REFERRED XB:JYNWGN Hyman Hopes, M.D. ,Dr Clyde Canterbury PROCEDURE DATE:  07/28/2013 PROCEDURE:   Colonoscopy, screening First Screening Colonoscopy - Avg.  risk and is 50 yrs.  old or older - No.  Prior Negative Screening - Now for repeat screening. 10 or more years since last screening  History of Adenoma - Now for follow-up colonoscopy & has been > or = to 3 yrs.  N/A  Polyps Removed Today? No.  Recommend repeat exam, <10 yrs? No. ASA CLASS:   Class II INDICATIONS:Average risk patient for colon cancer and last colonoscopy December 2004 was normal. MEDICATIONS: [name] sedation, administered by CRNA and Propofol (Diprivan) 270 mg IV  DESCRIPTION OF PROCEDURE:   After the risks benefits and alternatives of the procedure were thoroughly explained, informed consent was obtained.  A digital rectal exam revealed no abnormalities of the rectum.   The LB [id number]-H190 [id number]  endoscope was introduced through the anus and advanced to the cecum, which was identified by both the appendix and ileocecal valve. No adverse events experienced.   The quality of the prep was good, using MoviPrep  The instrument was then slowly withdrawn as the colon was fully examined.      COLON FINDINGS: A normal appearing cecum, ileocecal valve, and appendiceal orifice were identified.  The ascending, hepatic flexure, transverse, splenic flexure, descending, sigmoid colon and rectum appeared unremarkable.  No polyps or cancers were seen. Retroflexed views revealed no abnormalities. The time to cecum=9 minutes 41 seconds.  Withdrawal time=7 minutes 04 seconds.  The scope was withdrawn and the procedure completed. COMPLICATIONS: There were no complications.  ENDOSCOPIC IMPRESSION: Normal  colon  RECOMMENDATIONS: high-fiber diet Recall colonoscopy in 10 years   eSigned:  Hart Carwin, MD 07/28/2013 12:24 PM   cc:   PATIENT NAME:  Egan, Sahlin MR#: [id number]

## 2013-07-29 ENCOUNTER — Telehealth: Payer: Self-pay | Admitting: *Deleted

## 2013-07-29 NOTE — Telephone Encounter (Signed)
No answer, left message to call if questions or concerns. 

## 2013-11-25 ENCOUNTER — Ambulatory Visit (INDEPENDENT_AMBULATORY_CARE_PROVIDER_SITE_OTHER): Payer: 59 | Admitting: Sports Medicine

## 2013-11-25 ENCOUNTER — Encounter: Payer: Self-pay | Admitting: Sports Medicine

## 2013-11-25 VITALS — BP 119/81 | Ht 65.0 in | Wt 140.0 lb

## 2013-11-25 DIAGNOSIS — M7660 Achilles tendinitis, unspecified leg: Secondary | ICD-10-CM

## 2013-11-25 MED ORDER — NITROGLYCERIN 0.2 MG/HR TD PT24: MEDICATED_PATCH | TRANSDERMAL | Status: DC

## 2013-11-25 NOTE — Assessment & Plan Note (Signed)
B. chronic Achilles problems are probably improved as much as they will with the current therapy  She is given a protocol to wean the nitroglycerin over the next 3 months  Since she has not done much rehabilitation we will try gentle walking uphill about 10 repeats on a daily basis  After 3 months we should rescan this to see how much residual scar tissue she may have in the left Achilles

## 2013-11-25 NOTE — Patient Instructions (Signed)
Start NTG wean over next 3 months  1/4 patch M to Sunday wks 1/2 Then M to Friday wks 3 and 4 Then M/ W / Th Sat wks 5 and 6 Then M/W/F weeks 7 and 8 Then M/ Th weeks 9 and 10 Then only if sore on weeks 11 and 12 Then Stop  For Home exercise Find a gentle slope and walk up 10 times at least 3 to 4 days per week  Recheck 3 months

## 2013-11-25 NOTE — Progress Notes (Signed)
Patient ID: Tina Nielsen, female   DOB: September 03, 1950, 63 y.o.   MRN: [id number]  Chronic Achilles tedinopathy past 18 mos This has improved on a nitroglycerin protocol She has had trouble doing the exercises because they hurt her forefoot and she has osteopenia  Fast walking Toe walking These both still trigger pain  Since last visit she's had no real painful days She still feels some thickness particularly on the left Achilles However, this is no longer tender to squeeze or when she gets to the end of the day  Physical examination Thin and in no acute distress BP 119/81  Ht 5\' 5"  (1.651 m)  Wt 140 lb (63.504 kg)  BMI 23.30 kg/m2  Examination reveals a moderately thick nodule 3-6 cm above the calcaneal insertion of the left Achilles No swelling No tenderness to squeeze or palpation  Ankle motion is normal bilaterally  Right Achilles shows a smaller nodular area 3-6 cm above the calcaneus that is nontender

## 2014-02-10 ENCOUNTER — Encounter: Payer: Self-pay | Admitting: Women's Health

## 2014-02-10 ENCOUNTER — Ambulatory Visit: Payer: 59 | Admitting: Sports Medicine

## 2014-02-10 ENCOUNTER — Other Ambulatory Visit (HOSPITAL_COMMUNITY)
Admission: RE | Admit: 2014-02-10 | Discharge: 2014-02-10 | Disposition: A | Discharge status: Home / Self Care | Payer: 59 | Source: Ambulatory Visit | Attending: Gynecology | Admitting: Gynecology

## 2014-02-10 ENCOUNTER — Ambulatory Visit (INDEPENDENT_AMBULATORY_CARE_PROVIDER_SITE_OTHER): Payer: 59 | Admitting: Women's Health

## 2014-02-10 VITALS — BP 120/70 | Ht 66.0 in | Wt 140.0 lb

## 2014-02-10 DIAGNOSIS — Z01419 Encounter for gynecological examination (general) (routine) without abnormal findings: Secondary | ICD-10-CM

## 2014-02-10 LAB — URINALYSIS W MICROSCOPIC + REFLEX CULTURE
BILIRUBIN URINE: NEGATIVE
Bacteria, UA: NONE SEEN
CASTS: NONE SEEN
Crystals: NONE SEEN
GLUCOSE, UA: NEGATIVE mg/dL
Hgb urine dipstick: NEGATIVE
KETONES UR: NEGATIVE mg/dL
Leukocytes, UA: NEGATIVE
Nitrite: NEGATIVE
PH: 5.5 (ref 5.0–8.0)
PROTEIN: NEGATIVE mg/dL
Specific Gravity, Urine: 1.022 (ref 1.005–1.030)
Squamous Epithelial / LPF: NONE SEEN
Urobilinogen, UA: 0.2 mg/dL (ref 0.0–1.0)

## 2014-02-10 NOTE — Progress Notes (Signed)
CATALINA SALASAR 03/03/1951 [id number]    History:    Presents for annual exam.  Postmenopausal/no HRT/no bleeding. Osteopenic 2011 DEXA T score -1.8 at the hip no elevated FRAX, normal dexa at health screening at work 07/2013. Labs at primary care, up-to-date on immunizations. Not sexually active. Normal mammogram and pap history. Mother breast cancer age 64 has had twice living age 22. Normal colonoscopy 07/2013.  Past medical history, past surgical history, family history and social history were all reviewed and documented in the EPIC chart. 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 [place]  ROS:  A  12 point ROS was performed and pertinent positives and negatives are included.  Exam:  Filed Vitals:   02/10/14 0836  BP: 120/70    General appearance:  Normal Thyroid:  Symmetrical, normal in size, without palpable masses or nodularity. Respiratory  Auscultation:  Clear without wheezing or rhonchi Cardiovascular  Auscultation:  Regular rate, without rubs, murmurs or gallops  Edema/varicosities:  Not grossly evident Abdominal  Soft,nontender, without masses, guarding or rebound.  Liver/spleen:  No organomegaly noted  Hernia:  None appreciated  Skin  Inspection:  Grossly normal   Breasts: Examined lying and sitting.     Right: Without masses, retractions, discharge or axillary adenopathy.     Left: Without masses, retractions, discharge or axillary adenopathy. Gentitourinary   Inguinal/mons:  Normal without inguinal adenopathy  External genitalia:  Normal  BUS/Urethra/Skene's glands:  Normal  Vagina:  +1 asymptomatic cystocele  Cervix:  Normal  Uterus:   normal in size, shape and contour.  Midline and mobile  Adnexa/parametria:     Rt: Without masses or tenderness.   Lt: Without masses or tenderness.  Anus and perineum: Normal  Digital rectal exam: Normal sphincter tone without palpated masses or tenderness  Assessment/Plan:  63 y.o.DWF G2P2  for annual exam  with no complaints.  Postmenopausal/no HRT/no bleeding Osteopenia with followup normal DEXA 2014 Labs-primary care  Plan: SBE's, annual mammogram, 3D tomography reviewed, encouraged history of dense breast. Continue regular exercise, active lifestyle, calcium rich diet, vitamin D 2000 daily. Reports normal vitamin D at primary care. Pap, Pap normal 2013, he screening guidelines reviewed. Home safety, fall prevention and importance of regular exercise discussed.   Note: This dictation was prepared with Dragon/digital dictation.  Any transcriptional errors that result are unintentional. [facility], 9:11 AM 02/10/2014

## 2014-02-10 NOTE — Patient Instructions (Signed)
Health Recommendations for Postmenopausal Women Respected and ongoing research has looked at the most common causes of death, disability, and poor quality of life in postmenopausal women. The causes include heart disease, diseases of blood vessels, diabetes, depression, cancer, and bone loss (osteoporosis). Many things can be done to help lower the chances of developing these and other common problems: CARDIOVASCULAR DISEASE Heart Disease: A heart attack is a medical emergency. Know the signs and symptoms of a heart attack. Below are things women can do to reduce their risk for heart disease.   Do not smoke. If you smoke, quit.  Aim for a healthy weight. Being overweight causes many preventable deaths. Eat a healthy and balanced diet and drink an adequate amount of liquids.  Get moving. Make a commitment to be more physically active. Aim for 30 minutes of activity on most, if not all days of the week.  Eat for heart health. Choose a diet that is low in saturated fat and cholesterol and eliminate trans fat. Include whole grains, vegetables, and fruits. Read and understand the labels on food containers before buying.  Know your numbers. Ask your caregiver to check your blood pressure, cholesterol (total, HDL, LDL, triglycerides) and blood glucose. Work with your caregiver on improving your entire clinical picture.  High blood pressure. Limit or stop your table salt intake (try salt substitute and food seasonings). Avoid salty foods and drinks. Read labels on food containers before buying. Eating well and exercising can help control high blood pressure. STROKE  Stroke is a medical emergency. Stroke may be the result of a blood clot in a blood vessel in the brain or by a brain hemorrhage (bleeding). Know the signs and symptoms of a stroke. To lower the risk of developing a stroke:  Avoid fatty foods.  Quit smoking.  Control your diabetes, blood pressure, and irregular heart rate. THROMBOPHLEBITIS  (BLOOD CLOT) OF THE LEG  Becoming overweight and leading a stationary lifestyle may also contribute to developing blood clots. Controlling your diet and exercising will help lower the risk of developing blood clots. CANCER SCREENING  Breast Cancer: Take steps to reduce your risk of breast cancer.  You should practice "breast self-awareness." This means understanding the normal appearance and feel of your breasts and should include breast self-examination. Any changes detected, no matter how small, should be reported to your caregiver.  After age 40, you should have a clinical breast exam (CBE) every year.  Starting at age 40, you should consider having a mammogram (breast X-ray) every year.  If you have a family history of breast cancer, talk to your caregiver about genetic screening.  If you are at high risk for breast cancer, talk to your caregiver about having an MRI and a mammogram every year.  Intestinal or Stomach Cancer: Tests to consider are a rectal exam, fecal occult blood, sigmoidoscopy, and colonoscopy. Women who are high risk may need to be screened at an earlier age and more often.  Cervical Cancer:  Beginning at age 30, you should have a Pap test every 3 years as long as the past 3 Pap tests have been normal.  If you have had past treatment for cervical cancer or a condition that could lead to cancer, you need Pap tests and screening for cancer for at least 20 years after your treatment.  If you had a hysterectomy for a problem that was not cancer or a condition that could lead to cancer, then you no longer need Pap tests.    If you are between ages 65 and 70, and you have had normal Pap tests going back 10 years, you no longer need Pap tests.  If Pap tests have been discontinued, risk factors (such as a new sexual partner) need to be reassessed to determine if screening should be resumed.  Some medical problems can increase the chance of getting cervical cancer. In these  cases, your caregiver may recommend more frequent screening and Pap tests.  Uterine Cancer: If you have vaginal bleeding after reaching menopause, you should notify your caregiver.  Ovarian cancer: Other than yearly pelvic exams, there are no reliable tests available to screen for ovarian cancer at this time except for yearly pelvic exams.  Lung Cancer: Yearly chest X-rays can detect lung cancer and should be done on high risk women, such as cigarette smokers and women with chronic lung disease (emphysema).  Skin Cancer: A complete body skin exam should be done at your yearly examination. Avoid overexposure to the sun and ultraviolet light lamps. Use a strong sun block cream when in the sun. All of these things are important in lowering the risk of skin cancer. MENOPAUSE Menopause Symptoms: Hormone therapy products are effective for treating symptoms associated with menopause:  Moderate to severe hot flashes.  Night sweats.  Mood swings.  Headaches.  Tiredness.  Loss of sex drive.  Insomnia.  Other symptoms. Hormone replacement carries certain risks, especially in older women. Women who use or are thinking about using estrogen or estrogen with progestin treatments should discuss that with their caregiver. Your caregiver will help you understand the benefits and risks. The ideal dose of hormone replacement therapy is not known. The Food and Drug Administration (FDA) has concluded that hormone therapy should be used only at the lowest doses and for the shortest amount of time to reach treatment goals.  OSTEOPOROSIS Protecting Against Bone Loss and Preventing Fracture: If you use hormone therapy for prevention of bone loss (osteoporosis), the risks for bone loss must outweigh the risk of the therapy. Ask your caregiver about other medications known to be safe and effective for preventing bone loss and fractures. To guard against bone loss or fractures, the following is recommended:  If  you are less than age 50, take 1000 mg of calcium and at least 600 mg of Vitamin D per day.  If you are greater than age 50 but less than age 70, take 1200 mg of calcium and at least 600 mg of Vitamin D per day.  If you are greater than age 70, take 1200 mg of calcium and at least 800 mg of Vitamin D per day. Smoking and excessive alcohol intake increases the risk of osteoporosis. Eat foods rich in calcium and vitamin D and do weight bearing exercises several times a week as your caregiver suggests. DIABETES Diabetes Melitus: If you have Type I or Type 2 diabetes, you should keep your blood sugar under control with diet, exercise and recommended medication. Avoid too many sweets, starchy and fatty foods. Being overweight can make control more difficult. COGNITION AND MEMORY Cognition and Memory: Menopausal hormone therapy is not recommended for the prevention of cognitive disorders such as Alzheimer's disease or memory loss.  DEPRESSION  Depression may occur at any age, but is common in elderly women. The reasons may be because of physical, medical, social (loneliness), or financial problems and needs. If you are experiencing depression because of medical problems and control of symptoms, talk to your caregiver about this. Physical activity and   exercise may help with mood and sleep. Community and volunteer involvement may help your sense of value and worth. If you have depression and you feel that the problem is getting worse or becoming severe, talk to your caregiver about treatment options that are best for you. ACCIDENTS  Accidents are common and can be serious in the elderly woman. Prepare your house to prevent accidents. Eliminate throw rugs, place hand bars in the bath, shower and toilet areas. Avoid wearing high heeled shoes or walking on wet, snowy, and icy areas. Limit or stop driving if you have vision or hearing problems, or you feel you are unsteady with you movements and  reflexes. HEPATITIS C Hepatitis C is a type of viral infection affecting the liver. It is spread mainly through contact with blood from an infected person. It can be treated, but if left untreated, it can lead to severe liver damage over years. Many people who are infected do not know that the virus is in their blood. If you are a "baby-boomer", it is recommended that you have one screening test for Hepatitis C. IMMUNIZATIONS  Several immunizations are important to consider having during your senior years, including:   Tetanus, diptheria, and pertussis booster shot.  Influenza every year before the flu season begins.  Pneumonia vaccine.  Shingles vaccine.  Others as indicated based on your specific needs. Talk to your caregiver about these. Document Released: 09/15/2005 Document Revised: 07/10/2012 Document Reviewed: 05/11/2008 [facility] Patient Information 2015 [place], [place]. This information is not intended to replace advice given to you by your health care provider. Make sure you discuss any questions you have with your health care provider.

## 2014-02-11 LAB — CYTOLOGY - PAP

## 2014-03-04 ENCOUNTER — Ambulatory Visit: Payer: 59 | Admitting: Sports Medicine

## 2014-03-24 ENCOUNTER — Encounter: Payer: Self-pay | Admitting: Sports Medicine

## 2014-03-24 ENCOUNTER — Ambulatory Visit (INDEPENDENT_AMBULATORY_CARE_PROVIDER_SITE_OTHER): Payer: 59 | Admitting: Sports Medicine

## 2014-03-24 VITALS — BP 135/69 | HR 61 | Ht 65.0 in | Wt 140.0 lb

## 2014-03-24 DIAGNOSIS — M7662 Achilles tendinitis, left leg: Secondary | ICD-10-CM

## 2014-03-24 DIAGNOSIS — M7660 Achilles tendinitis, unspecified leg: Secondary | ICD-10-CM

## 2014-03-24 NOTE — Assessment & Plan Note (Signed)
Patient clinically improved from her Achilles tendinitis from nitroglycerin protocol. Advised patient to continue exercising and stretching routine with eccentric heel raises on a regular three-day a week basis. Patient can repeat nitroglycerin protocol for proximally 3 weeks if pain and symptoms returned but to notify us if she does not notice any clinical improvement over that 3 week period. Patient will followup when necessary. She can also obtain over-the-counter insole with padding over the forefoot to help with her fat pad atrophy.

## 2014-03-24 NOTE — Progress Notes (Signed)
Patient ID: Tina Nielsen, female   DOB: 12-15-1950, 63 y.o.   MRN: [id number]  Tina Nielsen - 63 y.o. female MRN [id number]  Date of birth: 07-14-1951  SUBJECTIVE:  Including CC & ROS.  -Chronic Achilles tedinopathy, improved -This has improved on a nitroglycerin protocol was weaned off over the summer stopping in mid july -She has had trouble doing the exercises because they hurt her forefoot from thinning of the forefoot fat pad -Since discontinuing nitroglycerin patient reports resolution of her pain. She has not been stretching routinely as possible because of forefoot discomfort. She's been wearing comfortable shoes with any activity. On but asked about wearing an insulin help with her forefoot discomfort -She still feels some thickness particularly on the left Achilles -However, this is no longer tender to squeeze or when she gets to the end of the day  ROS: Review of systems otherwise negative except for information present in HPI  HISTORY: Past Medical, Surgical, Social, and Family History Reviewed & Updated per EMR. Pertinent Historical Findings include: osteopenia  DATA REVIEWED: Previous MSK [place] with calcific tendinitis changes throughout the left Achilles tendon  PHYSICAL EXAM:  VS: BP:135/69 mmHg  HR:61bpm  TEMP: ( )  RESP:   HT:5\' 5"  (165.1 cm)   WT:140 lb (63.504 kg)  BMI:23.3 PHYSICAL EXAM: FOOT/ ANKLE EXAM:  General: well nourished Skin of LE: warm; dry, no rashes, lesions, ecchymosis or erythema. Vascular: radial pulses 2+ bilaterally Neurologically: Sensation to light touch lower extremities equal and intact bilaterally.  Foot/Ankle Exam:  Observation - no ecchymosis, no edema, or hematoma present  Palpation: persistent moderately thick nodule 3-6 cm above the calcaneal insertion of the left Achilles No tenderness over the Achilles tendon insertion. No tenderness along the mid substance of the Achilles or gastroc or soleus. No tenderness over the plantar  aspect of the calcareous at the plantar fascia inserts ROM:  Normal ankle motion in plantar flexion, dorsi flexion, inversion and eversion rotation.  Muscle strength: Normal strength in dorsiflexion, plantar flexion, inversion, eversion and phalanx extension/flexion 5/5 strength bilaterally.   Normal weight bearing arch with no collapse  MSK [place]: Left Achilles tendon thickening with nodule remained at 0.76 cm X 2.02cm with small and thinner calcifications. Right Achilles tendon at +6 cm from calcaneus was approximately 0.59 cm x 1.63 cm with very small calcifications throughout.  ASSESSMENT & PLAN: See problem based charting & AVS for pt instructions.

## 2014-06-08 ENCOUNTER — Encounter: Payer: Self-pay | Admitting: Sports Medicine

## 2014-07-21 ENCOUNTER — Encounter: Payer: Self-pay | Admitting: Women's Health

## 2014-10-23 ENCOUNTER — Other Ambulatory Visit: Payer: Self-pay | Admitting: Dermatology

## 2015-09-01 ENCOUNTER — Other Ambulatory Visit: Payer: Self-pay | Admitting: Radiology

## 2015-09-02 ENCOUNTER — Other Ambulatory Visit: Payer: Self-pay | Admitting: Radiology

## 2015-09-02 DIAGNOSIS — C801 Malignant (primary) neoplasm, unspecified: Secondary | ICD-10-CM

## 2015-09-03 ENCOUNTER — Encounter: Payer: Self-pay | Admitting: *Deleted

## 2015-09-03 ENCOUNTER — Telehealth: Payer: Self-pay | Admitting: *Deleted

## 2015-09-03 DIAGNOSIS — Z170 Estrogen receptor positive status [ER+]: Secondary | ICD-10-CM

## 2015-09-03 DIAGNOSIS — C50411 Malignant neoplasm of upper-outer quadrant of right female breast: Secondary | ICD-10-CM

## 2015-09-03 HISTORY — DX: Malignant neoplasm of upper-outer quadrant of right female breast: C50.411

## 2015-09-03 NOTE — Telephone Encounter (Signed)
Confirmed [facility] for 09/08/15 at 1230 .  Instructions and contact information given.

## 2015-09-06 ENCOUNTER — Encounter: Payer: Self-pay | Admitting: Women's Health

## 2015-09-06 ENCOUNTER — Ambulatory Visit
Admission: RE | Admit: 2015-09-06 | Discharge: 2015-09-06 | Disposition: A | Discharge status: Home / Self Care | Payer: PRIVATE HEALTH INSURANCE | Source: Ambulatory Visit | Attending: Radiology | Admitting: Radiology

## 2015-09-06 DIAGNOSIS — C801 Malignant (primary) neoplasm, unspecified: Secondary | ICD-10-CM

## 2015-09-06 MED ORDER — GADOBENATE DIMEGLUMINE 529 MG/ML IV SOLN
13.0000 mL | Freq: Once | INTRAVENOUS | Status: AC | PRN
  Administered 2015-09-06: 13 mL via INTRAVENOUS

## 2015-09-08 ENCOUNTER — Ambulatory Visit
Admission: RE | Admit: 2015-09-08 | Payer: PRIVATE HEALTH INSURANCE | Source: Ambulatory Visit | Admitting: Radiation Oncology

## 2015-09-08 ENCOUNTER — Ambulatory Visit: Payer: Self-pay | Admitting: Hematology and Oncology

## 2015-09-08 ENCOUNTER — Other Ambulatory Visit: Payer: Self-pay

## 2015-09-08 ENCOUNTER — Ambulatory Visit: Payer: PRIVATE HEALTH INSURANCE | Admitting: Physical Therapy

## 2015-09-10 ENCOUNTER — Telehealth: Payer: Self-pay | Admitting: Oncology

## 2015-09-10 NOTE — Telephone Encounter (Signed)
Lt mess regarding new pt breast referral

## 2015-09-13 ENCOUNTER — Telehealth: Payer: Self-pay | Admitting: Oncology

## 2015-09-13 NOTE — Telephone Encounter (Signed)
Lt mess for pt regarding new pt referral  °

## 2015-09-13 NOTE — Progress Notes (Signed)
Location of Breast Cancer:Breast Cancer Upper Outer Quadrant Right Breast  Histology per Pathology Report: 09-01-15 Diagnosis Breast, right, needle core biopsy - INVASIVE DUCTAL CARCINOMA. - DUCTAL CARCINOMA IN SITU. - LOBULAR NEOPLASIA (ATYPICAL LOBULAR HYPERPLASIA) WITH CALCIFICATIONS. - VASCULAR CALCIFICATIONS. - SEE COMMENT. Microscopic Comment Receptor Status: ER(100%), PR (90%), Her2-neu (neg), Ki-67(10%)  Tina Nielsen presened with a abnormal mammography screening 09-08-15.  Past/Anticipated interventions by surgeon, if NOM:VEHMCNO not scheduled yet  Past/Anticipated interventions by medical oncology, if any: 09-01-15 Biopsy Right Breast  Lymphedema issues, if any:  No  Pain issues, if any:  0/10  Takes Extra  Strength Tylenol for pain control  SAFETY ISSUES:  Prior radiation? No  Pacemaker/ICD? No  Possible current pregnancy?No  Is the patient on methotrexate? No  Current Complaints / other details:  Here today for discussion of the radiation treatment plan.   Menarche age 46, G46, P2, Menopause age50, BC 10-15 years, HRT None,   BP 117/56 mmHg  Pulse 67  Temp(Src) 98.2 F (36.8 C)  Ht 5' 5" (1.651 m)  Wt 141 lb 3.2 oz (64.048 kg)  BMI 23.50 kg/m2  SpO2 100% Tina Spurling, RN 09/13/2015,5:09 PM

## 2015-09-13 NOTE — Telephone Encounter (Signed)
PT RETURN CALL CONFIRMED APPT FOR 09/16/15.

## 2015-09-15 ENCOUNTER — Encounter: Payer: Self-pay | Admitting: Radiation Oncology

## 2015-09-15 ENCOUNTER — Ambulatory Visit
Admission: RE | Admit: 2015-09-15 | Discharge: 2015-09-15 | Disposition: A | Discharge status: Home / Self Care | Payer: Managed Care, Other (non HMO) | Source: Ambulatory Visit | Attending: Radiation Oncology | Admitting: Radiation Oncology

## 2015-09-15 VITALS — BP 117/56 | HR 67 | Temp 98.2°F | Ht 65.0 in | Wt 141.2 lb

## 2015-09-15 DIAGNOSIS — Z170 Estrogen receptor positive status [ER+]: Secondary | ICD-10-CM | POA: Insufficient documentation

## 2015-09-15 DIAGNOSIS — Z510 Encounter for antineoplastic radiation therapy: Secondary | ICD-10-CM | POA: Diagnosis not present

## 2015-09-15 DIAGNOSIS — C50411 Malignant neoplasm of upper-outer quadrant of right female breast: Secondary | ICD-10-CM | POA: Diagnosis present

## 2015-09-16 ENCOUNTER — Other Ambulatory Visit (HOSPITAL_BASED_OUTPATIENT_CLINIC_OR_DEPARTMENT_OTHER): Payer: PRIVATE HEALTH INSURANCE

## 2015-09-16 ENCOUNTER — Ambulatory Visit (HOSPITAL_BASED_OUTPATIENT_CLINIC_OR_DEPARTMENT_OTHER): Payer: PRIVATE HEALTH INSURANCE | Admitting: Oncology

## 2015-09-16 VITALS — BP 130/64 | HR 71 | Temp 97.3°F | Resp 18 | Ht 65.0 in | Wt 140.0 lb

## 2015-09-16 DIAGNOSIS — C50411 Malignant neoplasm of upper-outer quadrant of right female breast: Secondary | ICD-10-CM

## 2015-09-16 DIAGNOSIS — Z170 Estrogen receptor positive status [ER+]: Secondary | ICD-10-CM | POA: Diagnosis not present

## 2015-09-16 LAB — CBC WITH DIFFERENTIAL/PLATELET
BASO%: 0.7 % (ref 0.0–2.0)
BASOS ABS: 0 10*3/uL (ref 0.0–0.1)
EOS%: 1.7 % (ref 0.0–7.0)
Eosinophils Absolute: 0.1 10*3/uL (ref 0.0–0.5)
HCT: 36 % (ref 34.8–46.6)
HEMOGLOBIN: 11.5 g/dL — AB (ref 11.6–15.9)
LYMPH#: 2 10*3/uL (ref 0.9–3.3)
LYMPH%: 32.6 % (ref 14.0–49.7)
MCH: 28.8 pg (ref 25.1–34.0)
MCHC: 32 g/dL (ref 31.5–36.0)
MCV: 90 fL (ref 79.5–101.0)
MONO#: 0.4 10*3/uL (ref 0.1–0.9)
MONO%: 6.5 % (ref 0.0–14.0)
NEUT%: 58.5 % (ref 38.4–76.8)
NEUTROS ABS: 3.5 10*3/uL (ref 1.5–6.5)
Platelets: 218 10*3/uL (ref 145–400)
RBC: 4 10*6/uL (ref 3.70–5.45)
RDW: 14.7 % — ABNORMAL HIGH (ref 11.2–14.5)
WBC: 6 10*3/uL (ref 3.9–10.3)

## 2015-09-16 LAB — COMPREHENSIVE METABOLIC PANEL
ALBUMIN: 3.8 g/dL (ref 3.5–5.0)
ALK PHOS: 65 U/L (ref 40–150)
ALT: 14 U/L (ref 0–55)
AST: 22 U/L (ref 5–34)
Anion Gap: 9 mEq/L (ref 3–11)
BILIRUBIN TOTAL: 0.77 mg/dL (ref 0.20–1.20)
BUN: 16.1 mg/dL (ref 7.0–26.0)
CO2: 29 mEq/L (ref 22–29)
Calcium: 9.1 mg/dL (ref 8.4–10.4)
Chloride: 103 mEq/L (ref 98–109)
Creatinine: 0.9 mg/dL (ref 0.6–1.1)
EGFR: 71 mL/min/{1.73_m2} — AB (ref >90)
GLUCOSE: 103 mg/dL (ref 70–140)
Potassium: 3.8 mEq/L (ref 3.5–5.1)
SODIUM: 141 meq/L (ref 136–145)
TOTAL PROTEIN: 6.9 g/dL (ref 6.4–8.3)

## 2015-09-16 NOTE — Progress Notes (Signed)
  Radiation Oncology         [phone]) [phone] ________________________________  Initial outpatient Consultation - Date: 09/15/2015   Name: Tina Nielsen MRN: [id number]   DOB: [birth year]  REFERRING PHYSICIAN: Erroll Luna, MD  DIAGNOSIS AND STAGE: T1 N0 Grade 1 Invasive Ductal Carcinoma of the Right Breast.    HISTORY OF PRESENT ILLNESS::Tina Nielsen is a 65 y.o. female with T1 N0 Grade 1 invasive ductal carcinoma of the right breast. She palpated a left breast mass and underwent a screening mammogram. Her left breast was negative for any worrisome findings. Her right breast showed a 4 mm area of calcifications in the upper-outer quadrant. She had a stereotactic biopsy of these calcifications that showed a grade 1 invasive ductal carcinoma with DCIS and ALH, ER positive, PR positive, and HER2 negative. The KI67 was 10 %. MRI showed a 5 cm hematoma in the area of her biopsy. No worrisome adenopathy was noted. She has met with Dr. Donne Hazel and is interested in breast conservation. Her surgery is tentatively scheduled for 2/23. Her mother was treated by Dr.  Dr. Valere Dross and is currently doing well. She is accompanied by her husband.   PREVIOUS RADIATION THERAPY: No  Past medical, social and family history were reviewed in the electronic chart. Review of symptoms was reviewed in the electronic chart. Medications were reviewed in the electronic chart.   PHYSICAL EXAM:  Filed Vitals:   09/15/15 1529  BP: 117/56  Pulse: 67  Temp: 98.2 F (36.8 C)  .141 lb 3.2 oz (64.048 kg). Pleasant female. No distress. Hematoma over the upper outer quadrant of the right breast with bruising. No palpable abnormalities of the left breast. No palpable cervical, supraclavicular or axillary adenopathy.   IMPRESSION: Tina Nielsen is a 65 y.o female with T1 N0 grade 1 invasive ductal carcinoma of the right breast  PLAN: I spoke to the patient today regarding her diagnosis and options for treatment. We  discussed the equivalence in terms of survival and local failure between mastectomy and breast conservation. We discussed the role of radiation in decreasing local failures in patients who undergo lumpectomy. We discussed the process of simulation and the placement tattoos. We discussed 4 weeks of treatment as an outpatient. We discussed the possibility of asymptomatic lung damage. We discussed the low likelihood of secondary malignancies. We discussed the possible side effects including but not limited to skin redness, fatigue, permanent skin darkening, and breast swelling.   I will see her back after her surgery (and oncotype if ordered).    I spent 40 minutes  face to face with the patient and more than 50% of that time was spent in counseling and/or coordination of care.   ------------------------------------------------  Thea Silversmith, MD  This document serves as a record of services personally performed by Thea Silversmith, MD. It was created on her behalf by Jenell Milliner, a trained medical scribe. The creation of this record is based on the scribe's personal observations and the provider's statements to them. This document has been checked and approved by the attending provider.

## 2015-09-16 NOTE — Progress Notes (Signed)
[facility]  [phone] [phone]) [phone]     ID: [name] DOB: 1951-02-28  MR#: [id number]  GGE#:[id number]  Patient Care Team: Maurice Small, MD as PCP - General (Family Medicine) Chauncey Cruel, MD as Consulting Physician (Oncology) Chauncey Cruel, MD as Consulting Physician (Oncology) Rolm Bookbinder, MD as Consulting Physician (General Surgery) Thea Silversmith, MD as Consulting Physician (Radiation Oncology) PCP: Jonathon Bellows, MD GYN: OTHER MD:  CHIEF COMPLAINT: Estrogen receptor positive breast cancer  CURRENT TREATMENT: Awaiting definitive surgery   BREAST CANCER HISTORY: "Tina Nielsen" noted a possible lump in her left breast and was referred for diagnostic mammography with tomosynthesis at [facility] 08/17/2015. The breast composition was category D. There was no finding in the left breast to correlate with the patient's lower inner quadrant concern. Ultrasound was obtained the same day and was likewise unremarkable.   However in the right breast upper outer quadrant posteriorly there was a 4 mm area of grouped calcifications. This was felt to warrant biopsy, which was performed stereotactically 09/01/2015. The final pathology ([id number] [id number]) showed an invasive ductal carcinoma, grade 1, estrogen receptor 100% positive, progesterone receptor 90% positive, both with strong staining intensity, with an MIB-1 of 10%, and no HER-2 amplification, the signals ratio being 1.17 and the number per cell 2.35.  Bilateral breast MRI was obtained 09/06/2015. This describes the breast density as category C. In the upper outer quadrant of the right breast there was a clip. There was no suspicious surrounding enhancement. More laterally there was a 5.1 cm fluid collection compatible with a hematoma. The left breast was normal and there was no abnormal appearing adenopathy.   Her subsequent history is as detailed below.  INTERVAL HISTORY: Tina Nielsen was evaluated in the  breast clinic 09/16/2015 accompanied by her husband Tina Nielsen.Her case was also presented in the multidisciplinary breast cancer conference 09/08/2015. At that time a preliminary plan was proposed: Breast conserving surgery with sentinel lymph node sampling; adjuvant radiation; antiestrogen therapy; referral to genetics  REVIEW OF SYSTEMS: There were no specific symptoms leading to the original mammogram, which was routinely scheduled. The patient denies unusual headaches, visual changes, nausea, vomiting, stiff neck, dizziness, or gait imbalance. There has been no cough, phlegm production, or pleurisy, no chest pain or pressure, and no change in bowel or bladder habits. The patient denies fever, rash, bleeding, unexplained fatigue or unexplained weight loss. She admits to mild arthritis symptoms which are not more intense or persistent than before. She has had some anxiety associated with the recent diagnosis and there has been a mild change in her stool habits related to this. She bruises easily. A detailed review of systems was otherwise entirely negative.   PAST MEDICAL HISTORY: Past Medical History  Diagnosis Date  . Osteopenia   . Atrophic vaginitis   . High cholesterol   . Arthritis     knees  . Breast cancer of upper-outer quadrant of right female breast ([place]) 09/03/2015    PAST SURGICAL HISTORY: Past Surgical History  Procedure Laterality Date  . Breast surgery      BIOPSY  . Tubal ligation    . Knee surgery      FAMILY HISTORY Family History  Problem Relation Age of Onset  . Hypertension Mother   . Breast cancer Mother 24  . Heart disease Mother   . Aneurysm Father     AORTIC  . Heart disease Maternal Grandfather   . Colon cancer Neg Hx    the patient's  father died from a ruptured aortic aneurysm the age of 82. The patient's mother was diagnosed with breast cancer at age 77. She had a second breast cancer diagnosed at age 65. She is currently 66 years old. The patient had one  brother, no sisters. As far as she is aware that there are no other breast or ovarian cancer instances in the family  GYNECOLOGIC HISTORY:  No LMP recorded. Patient is postmenopausal. Menarche age 84, first live birth age 70, which the patient knows increases the risk of breast cancer. She is GX P2. She stopped having periods around age 22. She did not take hormone replacement. 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 Her husband Tina Nielsen.history as well as coached basketball at the same school. He is now retired but is very active in the [employer]. He knows [name] well and has quite a few stories to tell regarding [employer] basketball. Their son Tina Nielsen lives in [place] and works in Designer, television/film set as a Environmental manager. Son Tina Nielsen lives in [place] working as a Designer, jewellery. The patient has no grandchildren.    ADVANCED DIRECTIVES: In place   HEALTH MAINTENANCE: Social History  Substance Use Topics  . Smoking status: Former Smoker    Quit date: 08/07/1976  . Smokeless tobacco: Never Used  . Alcohol Use: 4.2 oz/week    7 Glasses of wine per week     Colonoscopy: 2015/[facility]  PAP:  Bone density: October 2015, osteopenia  Lipid panel:  Allergies  Allergen Reactions  . Celebrex [Celecoxib]     Irritated stomach- caused burning  . Ibuprofen     "UPSET STOMACH"- burning    Current Outpatient Prescriptions  Medication Sig Dispense Refill  . acetaminophen (TYLENOL) 500 MG tablet Take 500 mg by mouth every 6 (six) hours as needed.    . Ascorbic Acid (VITAMIN C PO) Take by mouth.      . Calcium Carbonate-Vitamin D (CALCIUM + D PO) Take by mouth.      . escitalopram (LEXAPRO) 10 MG tablet take 1 tablet by mouth once daily 30 tablet 12  . Glucosamine-Chondroitin (GLUCOSAMINE CHONDR COMPLEX PO) Take by mouth.      . Multiple Vitamin  (MULTIVITAMIN) capsule Take 1 capsule by mouth daily.      . pravastatin (PRAVACHOL) 40 MG tablet   0  . VITAMIN E PO Take by mouth.       No current facility-administered medications for this visit.    OBJECTIVE: Middle-aged white woman in no acute distress Filed Vitals:   09/16/15 1625  BP: 130/64  Pulse: 71  Temp: 97.3 F (36.3 C)  Resp: 18     Body mass index is 23.3 kg/(m^2).    ECOG FS:0 - Asymptomatic  Ocular: Sclerae unicteric, pupils equal, round and reactive to light Ear-nose-throat: Oropharynx clear and moist Lymphatic: No cervical or supraclavicular adenopathy Lungs no rales or rhonchi, good excursion bilaterally Heart regular rate and rhythm, no murmur appreciated Abd soft, nontender, positive bowel sounds MSK no focal spinal tenderness, no joint edema Neuro: non-focal, well-oriented, appropriate affect Breasts: The right breast is status post recent biopsy. There is a palpable mass superiorly and laterally, associated with a significant ecchymosis. The patient has a known hematoma at that site. The right axilla is benign. The left breast is unremarkable. Note that both nipples are minimally inverted chronically   LAB  RESULTS:  CMP     Component Value Date/Time   NA 141 09/16/2015 1612   K 3.8 09/16/2015 1612   CO2 29 09/16/2015 1612   GLUCOSE 103 09/16/2015 1612   BUN 16.1 09/16/2015 1612   CREATININE 0.9 09/16/2015 1612   CALCIUM 9.1 09/16/2015 1612   PROT 6.9 09/16/2015 1612   ALBUMIN 3.8 09/16/2015 1612   AST 22 09/16/2015 1612   ALT 14 09/16/2015 1612   ALKPHOS 65 09/16/2015 1612   BILITOT 0.77 09/16/2015 1612    INo results found for: SPEP, UPEP  Lab Results  Component Value Date   WBC 6.0 09/16/2015   NEUTROABS 3.5 09/16/2015   HGB 11.5* 09/16/2015   HCT 36.0 09/16/2015   MCV 90.0 09/16/2015   PLT 218 09/16/2015      Chemistry      Component Value Date/Time   NA 141 09/16/2015 1612   K 3.8 09/16/2015 1612   CO2 29 09/16/2015 1612    BUN 16.1 09/16/2015 1612   CREATININE 0.9 09/16/2015 1612      Component Value Date/Time   CALCIUM 9.1 09/16/2015 1612   ALKPHOS 65 09/16/2015 1612   AST 22 09/16/2015 1612   ALT 14 09/16/2015 1612   BILITOT 0.77 09/16/2015 1612       No results found for: LABCA2  No components found for: LABCA125  No results for input(s): INR in the last 168 hours.  Urinalysis    Component Value Date/Time   COLORURINE YELLOW 02/10/2014 0846   APPEARANCEUR TURBID* 02/10/2014 0846   LABSPEC 1.022 02/10/2014 0846   PHURINE 5.5 02/10/2014 0846   GLUCOSEU NEG 02/10/2014 0846   HGBUR NEG 02/10/2014 0846   BILIRUBINUR NEG 02/10/2014 0846   KETONESUR NEG 02/10/2014 0846   PROTEINUR NEG 02/10/2014 0846   UROBILINOGEN 0.2 02/10/2014 0846   NITRITE NEG 02/10/2014 0846   LEUKOCYTESUR NEG 02/10/2014 0846      ELIGIBLE FOR AVAILABLE RESEARCH PROTOCOL: Alliance 11502 (ASA/placebo)   STUDIES: Mr Breast Bilateral W Wo Contrast  09/06/2015  CLINICAL DATA:  Patient with recent diagnosis right breast invasive ductal carcinoma and ductal carcinoma in situ. LABS:  Creatinine was obtained on site at [facility] at [address] Results: Creatinine 0.8 mg/dL.  GFR: 72 EXAM: BILATERAL BREAST MRI WITH AND WITHOUT CONTRAST TECHNIQUE: Multiplanar, multisequence MR images of both breasts were obtained prior to and following the intravenous administration of 13 ml of MultiHance. THREE-DIMENSIONAL MR IMAGE RENDERING ON INDEPENDENT WORKSTATION: Three-dimensional MR images were rendered by post-processing of the original MR data on an independent workstation. The three-dimensional MR images were interpreted, and findings are reported in the following complete MRI report for this study. Three dimensional images were evaluated at the independent DynaCad workstation COMPARISON:  Previous exam(s). FINDINGS: Breast composition: c.  Heterogeneous fibroglandular tissue. Background parenchymal enhancement: Moderate.  Right breast: Within the upper-outer right breast posterior depth there is susceptibility artifact compatible with biopsy marking clip. No suspicious surrounding enhancement is identified. Within the lateral right breast there is a 2.2 x 2.2 x 5.1 cm non-enhancing fluid collection most compatible with post biopsy hematoma/seroma. Left breast: No mass or abnormal enhancement. Lymph nodes: No abnormal appearing lymph nodes. Ancillary findings:  None. IMPRESSION: Susceptibility artifact within the upper-outer right breast compatible with biopsy marking clip. No significant surrounding enhancement is identified at the site of recently biopsied right breast malignancy. There is a 5 cm nonenhancing collection within the lateral right breast most compatible with post biopsy hematoma/seroma. RECOMMENDATION:  Treatment plan for known right breast malignancy. BI-RADS CATEGORY  6: Known biopsy-proven malignancy. Electronically Signed   By: Lovey Newcomer M.D.   On: 09/06/2015 15:58    ASSESSMENT: 65 y.o. [place] woman status post right breast upper outer quadrant biopsy 09/01/2015 for a clinical T1a N0, stage IA invasive ductal carcinoma, grade 1, estrogen and progesterone receptor positive, HER-2 not amplified, with an MIB-1 of 10%  (1) genetics testing pending  (2) breast conserving surgery with sentinel lymph node sampling pending  (3) adjuvant radiation to follow surgery  (4) anti-estrogens to follow completion of local treatment  PLAN: We spent the better part of today's hour-long appointment discussing the biology of breast cancer in general, and the specifics of the patient's tumor in particular. Tina Nielsen has a good understanding of the difference between local and systemic therapy for breast cancer. She understands there is no survival difference to mastectomy as compared to lumpectomy and radiation. This is true even if she carries a deleterious mutation.  We discussed the genetics issue at length. Because  the risk of another breast cancer developing in either breast can be between 40 and 80% in patients carrying for example a BRCA mutation many patients in that situation opted for bilateral mastectomies. Since Tina Nielsen has very dense breasts, that would also argue in favor of bilateral mastectomies. However it is still true that there is no survival advantage to bilateral mastectomies even in this case. What we do in patients with deleterious mutations to wish to keep her breasts is to intensify screening by adding yearly MRI to yearly mammography and breast exam. This is an entirely safe option.  At this time Tina Nielsen feels even if she carried a BRCA mutation she would still opt for lumpectomy and radiation followed by intensified screening. Accordingly there is no need to postpone her upcoming surgery, which is scheduled for later this month  We then discussed systemic treatment. She understands she is not a candidate for anti-HER-2 treatment. Chemotherapy can be useful in aggressive rapidly growing tumors but she has a very unaggressive slow-growing tumor. In fact her tumor is most consistent with a luminal A subtype. Those cancers generally do well and generally get practically no benefit from chemotherapy. In addition we do not use chemotherapy in tumors that are 5 mm or less as her appears to be  Accordingly at this point there is no plan for chemotherapy and she will proceed to antiestrogen therapy with either anastrozole or more likely tamoxifen when she completes her local treatment.  I am placing a referral for genetics testing and plan to see her in about 6-8 weeks, by which time we should have those results and she also may be completing her radiation therapy.  Tina Nielsen has a good understanding of the overall plan. She agrees with it. She knows the goal of treatment in her case is cure. She will call with any problems that may develop before her next visit here.  Chauncey Cruel, MD   09/16/2015 5:38  PM Medical Oncology and Hematology [facility] [address] [place], [place] [address] Tel. [phone]    Fax. [phone]

## 2015-09-20 ENCOUNTER — Telehealth: Payer: Self-pay | Admitting: Oncology

## 2015-09-20 ENCOUNTER — Other Ambulatory Visit: Payer: Self-pay | Admitting: General Surgery

## 2015-09-20 DIAGNOSIS — C50411 Malignant neoplasm of upper-outer quadrant of right female breast: Secondary | ICD-10-CM

## 2015-09-20 NOTE — Telephone Encounter (Signed)
Left message for patient in regards to Feb/April appointments date/time. Asked patient to call office if there is a conflict with these appointments

## 2015-09-21 ENCOUNTER — Telehealth: Payer: Self-pay | Admitting: Genetic Counselor

## 2015-09-21 NOTE — Telephone Encounter (Signed)
Returned pt call regarding making adjustment in her schedule. Lt mess regarding new time and same date. (3pm)

## 2015-09-22 ENCOUNTER — Encounter (HOSPITAL_BASED_OUTPATIENT_CLINIC_OR_DEPARTMENT_OTHER): Payer: Self-pay | Admitting: *Deleted

## 2015-09-27 ENCOUNTER — Encounter (HOSPITAL_BASED_OUTPATIENT_CLINIC_OR_DEPARTMENT_OTHER)
Admission: RE | Admit: 2015-09-27 | Discharge: 2015-09-27 | Disposition: A | Discharge status: Home / Self Care | Payer: PRIVATE HEALTH INSURANCE | Source: Ambulatory Visit | Attending: General Surgery | Admitting: General Surgery

## 2015-09-27 DIAGNOSIS — E7800 Pure hypercholesterolemia, unspecified: Secondary | ICD-10-CM | POA: Diagnosis not present

## 2015-09-27 DIAGNOSIS — C50911 Malignant neoplasm of unspecified site of right female breast: Secondary | ICD-10-CM | POA: Diagnosis present

## 2015-09-27 DIAGNOSIS — C50411 Malignant neoplasm of upper-outer quadrant of right female breast: Secondary | ICD-10-CM | POA: Diagnosis not present

## 2015-09-27 DIAGNOSIS — Z87891 Personal history of nicotine dependence: Secondary | ICD-10-CM | POA: Diagnosis not present

## 2015-09-27 LAB — BASIC METABOLIC PANEL
ANION GAP: 9 (ref 5–15)
BUN: 9 mg/dL (ref 6–20)
CALCIUM: 9.5 mg/dL (ref 8.9–10.3)
CO2: 29 mmol/L (ref 22–32)
CREATININE: 0.85 mg/dL (ref 0.44–1.00)
Chloride: 106 mmol/L (ref 101–111)
Glucose, Bld: 85 mg/dL (ref 65–99)
Potassium: 3.3 mmol/L — ABNORMAL LOW (ref 3.5–5.1)
SODIUM: 144 mmol/L (ref 135–145)

## 2015-09-27 LAB — CBC WITH DIFFERENTIAL/PLATELET
BASOS ABS: 0 10*3/uL (ref 0.0–0.1)
BASOS PCT: 1 %
EOS ABS: 0.1 10*3/uL (ref 0.0–0.7)
Eosinophils Relative: 1 %
HCT: 38.2 % (ref 36.0–46.0)
HEMOGLOBIN: 12.3 g/dL (ref 12.0–15.0)
Lymphocytes Relative: 17 %
Lymphs Abs: 1.3 10*3/uL (ref 0.7–4.0)
MCH: 29.6 pg (ref 26.0–34.0)
MCHC: 32.2 g/dL (ref 30.0–36.0)
MCV: 91.8 fL (ref 78.0–100.0)
Monocytes Absolute: 0.4 10*3/uL (ref 0.1–1.0)
Monocytes Relative: 6 %
NEUTROS ABS: 5.9 10*3/uL (ref 1.7–7.7)
NEUTROS PCT: 75 %
Platelets: 205 10*3/uL (ref 150–400)
RBC: 4.16 MIL/uL (ref 3.87–5.11)
RDW: 14.2 % (ref 11.5–15.5)
WBC: 7.8 10*3/uL (ref 4.0–10.5)

## 2015-09-28 ENCOUNTER — Encounter: Payer: Self-pay | Admitting: Oncology

## 2015-09-29 ENCOUNTER — Other Ambulatory Visit: Payer: PRIVATE HEALTH INSURANCE

## 2015-09-29 ENCOUNTER — Ambulatory Visit (HOSPITAL_BASED_OUTPATIENT_CLINIC_OR_DEPARTMENT_OTHER): Payer: PRIVATE HEALTH INSURANCE | Admitting: Genetic Counselor

## 2015-09-29 ENCOUNTER — Encounter: Payer: Self-pay | Admitting: Genetic Counselor

## 2015-09-29 DIAGNOSIS — Z315 Encounter for genetic counseling: Secondary | ICD-10-CM

## 2015-09-29 DIAGNOSIS — C50411 Malignant neoplasm of upper-outer quadrant of right female breast: Secondary | ICD-10-CM

## 2015-09-29 DIAGNOSIS — Z803 Family history of malignant neoplasm of breast: Secondary | ICD-10-CM

## 2015-09-29 NOTE — Progress Notes (Signed)
REFERRING PROVIDER: Carol Webb, MD [address] [place], [place] [address]   Gustav Magrinat, MD  PRIMARY PROVIDER:  WEBB, CAROL D, MD  PRIMARY REASON FOR VISIT:  1. Breast cancer of upper-outer quadrant of right female breast (HCC)   2. Family history of breast cancer      HISTORY OF PRESENT ILLNESS:   Tina Nielsen, a 65 y.o. female, was seen for a Deputy cancer genetics consultation at the request of Dr. Webb due to a personal and family history of cancer.  [name] presents to clinic today to discuss the possibility of a hereditary predisposition to cancer, genetic testing, and to further clarify her future cancer risks, as well as potential cancer risks for family members.   In 2017, at the age of 65, [name] was diagnosed with invasive ductal carcinoma of the right breast. This was treated with lumpectomy and radiation.    CANCER HISTORY:   No history exists.     HORMONAL RISK FACTORS:  Menarche was at age 12.  First live birth at age 29.  OCP use for approximately 10-15 years.  Ovaries intact: yes.  Hysterectomy: no.  Menopausal status: postmenopausal.  HRT use: 0 years. Colonoscopy: no; not examined. Mammogram within the last year: yes. Number of breast biopsies: 2. Up to date with pelvic exams:  yes. Any excessive radiation exposure in the past:  n/a  Past Medical History  Diagnosis Date  . Osteopenia   . Atrophic vaginitis   . High cholesterol   . Arthritis     knees  . Breast cancer of upper-outer quadrant of right female breast (HCC) 09/03/2015  . Anxiety   . Family history of breast cancer   . Breast cancer (HCC)     Past Surgical History  Procedure Laterality Date  . Breast surgery      BIOPSY  . Tubal ligation    . Knee surgery      Social History   Social History  . Marital Status: Married    Spouse Name: Walter  . Number of Children: 2  . Years of Education: N/A   Social History Main Topics  . Smoking  status: Former Smoker    Quit date: 08/07/1976  . Smokeless tobacco: Never Used  . Alcohol Use: 4.2 oz/week    7 Glasses of wine per week  . Drug Use: No  . Sexual Activity: No   Other Topics Concern  . None   Social History Narrative     FAMILY HISTORY:  We obtained a detailed, 4-generation family history.  Significant diagnoses are listed below: Family History  Problem Relation Age of Onset  . Hypertension Mother   . Breast cancer Mother 45    second cancer at 84  . Heart disease Mother   . Aneurysm Father     AORTIC  . Heart disease Maternal Grandfather   . Colon cancer Neg Hx   . Kidney cancer Maternal Aunt 40  . Prostate cancer Paternal Uncle 72  . Stroke Maternal Grandmother   . Breast cancer Other     MGMs sister  . Breast cancer Other     PGMs sister  . Breast cancer Other     PGF sister    The patient has two sons who are healthy.  She has one brother who is cancer free.  Her mother is alive at 94.  She was diagnosed with breast cancer at 45 and again at 88.  Her mother   had one brother and two sisters, one sister had kidney cancer and died at 63.  The patient's maternal grandparents died of a stroke and heart disease.  The patient's maternal grandmothers sister had breast cancer.  TEh patient's father died of an aortic aneurysm. He had two brothers and two sisters.  One brother died as a child and the other died from prostate cancer at 58.  Her paternal grandparents died natural causes.  Both paternal grandparents ahd one sister each who had breast cancer.  Patient's maternal ancestors are of [place] descent, and paternal ancestors are of [place] descent. There is no reported Ashkenazi Jewish ancestry. There is no known consanguinity.  GENETIC COUNSELING ASSESSMENT: MEDA DUDZINSKI is a 65 y.o. female with a personal and family history of cancer which is somewhat suggestive of a hereditary cancer syndrome and predisposition to cancer. We, therefore, discussed and  recommended the following at today's visit.   DISCUSSION: We discussed that about 5-10% of breast cancer is hereditary with most being caused by BRCA mutations.  Other hereditary genes that can involve mutations more commonly include CHEK2, PALB2 and ATM mutations.  We reviewed the characteristics, features and inheritance patterns of hereditary cancer syndromes. We also discussed genetic testing, including the appropriate family members to test, the process of testing, insurance coverage and turn-around-time for results. We discussed the implications of a negative, positive and/or variant of uncertain significant result. We recommended [name] pursue genetic testing for the Breast/Ovarian cancer gene panel. The Breast/Ovarian gene panel offered by [facility] includes sequencing and rearrangement analysis for the following 20 genes:  ATM, BARD1, BRCA1, BRCA2, BRIP1, CDH1, CHEK2, EPCAM, FANCC, MLH1, MSH2, MSH6, NBN, PALB2, PMS2, PTEN, RAD51C, RAD51D, TP53, and XRCC2.     Based on [name]'s personal and family history of cancer, she meets medical criteria for genetic testing. Despite that she meets criteria, she may still have an out of pocket cost. We discussed that if her out of pocket cost for testing is over $100, the laboratory will call and confirm whether she wants to proceed with testing.  If the out of pocket cost of testing is less than $100 she will be billed by the genetic testing laboratory.   PLAN: After considering the risks, benefits, and limitations, [name]  provided informed consent to pursue genetic testing and the blood sample was sent to [facility] for analysis of the Breast/Ovarian cancer panel. Results should be available within approximately 2-3 weeks' time, at which point they will be disclosed by telephone to [name], as will any additional recommendations warranted by these results. [name] will receive a summary of her genetic counseling visit and a copy of her  results once available. This information will also be available in Epic. We encouraged [name] to remain in contact with cancer genetics annually so that we can continuously update the family history and inform her of any changes in cancer genetics and testing that may be of benefit for her family. Ms. Salamone questions were answered to her satisfaction today. Our contact information was provided should additional questions or concerns arise.  Lastly, we encouraged Ms. Hamrick to remain in contact with cancer genetics annually so that we can continuously update the family history and inform her of any changes in cancer genetics and testing that may be of benefit for this family.   Ms.  Lordi questions were answered to her satisfaction today. Our contact information was provided should additional questions or concerns arise. Thank you  for the referral and allowing [place] to share in the care of your patient.   Diesel Lina P. Florene Glen, [place], [facility] Certified Genetic Counselor [email or web address] phone: [phone]  The patient was seen for a total of 60 minutes in face-to-face genetic counseling.  This patient was discussed with Drs. Magrinat, Lindi Adie and/or Burr Medico who agrees with the above.    _______________________________________________________________________ For Office Staff:  Number of people involved in session: 2 Was an Intern/ student involved with case: yes

## 2015-09-30 ENCOUNTER — Encounter (HOSPITAL_BASED_OUTPATIENT_CLINIC_OR_DEPARTMENT_OTHER): Payer: Self-pay | Admitting: Anesthesiology

## 2015-09-30 ENCOUNTER — Encounter (HOSPITAL_BASED_OUTPATIENT_CLINIC_OR_DEPARTMENT_OTHER)
Admission: RE | Disposition: A | Discharge status: Home / Self Care | Payer: Self-pay | Source: Ambulatory Visit | Attending: General Surgery

## 2015-09-30 ENCOUNTER — Ambulatory Visit (HOSPITAL_BASED_OUTPATIENT_CLINIC_OR_DEPARTMENT_OTHER): Payer: PRIVATE HEALTH INSURANCE | Admitting: Anesthesiology

## 2015-09-30 ENCOUNTER — Encounter (HOSPITAL_COMMUNITY): Payer: PRIVATE HEALTH INSURANCE

## 2015-09-30 ENCOUNTER — Ambulatory Visit (HOSPITAL_COMMUNITY)
Admission: RE | Admit: 2015-09-30 | Discharge: 2015-09-30 | Disposition: A | Discharge status: Home / Self Care | Payer: PRIVATE HEALTH INSURANCE | Source: Ambulatory Visit | Attending: General Surgery | Admitting: General Surgery

## 2015-09-30 ENCOUNTER — Ambulatory Visit (HOSPITAL_BASED_OUTPATIENT_CLINIC_OR_DEPARTMENT_OTHER)
Admission: RE | Admit: 2015-09-30 | Discharge: 2015-09-30 | Disposition: A | Discharge status: Home / Self Care | Payer: PRIVATE HEALTH INSURANCE | Source: Ambulatory Visit | Attending: General Surgery | Admitting: General Surgery

## 2015-09-30 DIAGNOSIS — C50411 Malignant neoplasm of upper-outer quadrant of right female breast: Secondary | ICD-10-CM

## 2015-09-30 DIAGNOSIS — Z87891 Personal history of nicotine dependence: Secondary | ICD-10-CM | POA: Insufficient documentation

## 2015-09-30 DIAGNOSIS — E7800 Pure hypercholesterolemia, unspecified: Secondary | ICD-10-CM | POA: Insufficient documentation

## 2015-09-30 HISTORY — PX: BREAST LUMPECTOMY WITH NEEDLE LOCALIZATION AND AXILLARY SENTINEL LYMPH NODE BX: SHX5760

## 2015-09-30 HISTORY — DX: Anxiety disorder, unspecified: F41.9

## 2015-09-30 SURGERY — BREAST LUMPECTOMY WITH NEEDLE LOCALIZATION AND AXILLARY SENTINEL LYMPH NODE BX
Anesthesia: General | Site: Breast | Laterality: Right

## 2015-09-30 MED ORDER — MIDAZOLAM HCL 2 MG/2ML IJ SOLN
1.0000 mg | INTRAMUSCULAR | Status: DC | PRN
  Administered 2015-09-30: 1 mg via INTRAVENOUS

## 2015-09-30 MED ORDER — SCOPOLAMINE 1 MG/3DAYS TD PT72: 1.0000 | MEDICATED_PATCH | Freq: Once | TRANSDERMAL | Status: DC | PRN

## 2015-09-30 MED ORDER — TECHNETIUM TC 99M SULFUR COLLOID FILTERED
1.0000 | Freq: Once | INTRAVENOUS | Status: AC | PRN
  Administered 2015-09-30: 1 via INTRADERMAL

## 2015-09-30 MED ORDER — ONDANSETRON HCL 4 MG/2ML IJ SOLN
INTRAMUSCULAR | Status: DC | PRN
  Administered 2015-09-30: 4 mg via INTRAVENOUS

## 2015-09-30 MED ORDER — FENTANYL CITRATE (PF) 100 MCG/2ML IJ SOLN
INTRAMUSCULAR | Status: AC
  Filled 2015-09-30: qty 2

## 2015-09-30 MED ORDER — MEPERIDINE HCL 25 MG/ML IJ SOLN: 6.2500 mg | INTRAMUSCULAR | Status: DC | PRN

## 2015-09-30 MED ORDER — PROMETHAZINE HCL 25 MG/ML IJ SOLN: 6.2500 mg | INTRAMUSCULAR | Status: DC | PRN

## 2015-09-30 MED ORDER — CEFAZOLIN SODIUM-DEXTROSE 2-3 GM-% IV SOLR
2.0000 g | INTRAVENOUS | Status: AC
  Administered 2015-09-30: 2 g via INTRAVENOUS

## 2015-09-30 MED ORDER — FENTANYL CITRATE (PF) 100 MCG/2ML IJ SOLN
50.0000 ug | INTRAMUSCULAR | Status: AC | PRN
  Administered 2015-09-30 (×3): 50 ug via INTRAVENOUS

## 2015-09-30 MED ORDER — HYDROCODONE-ACETAMINOPHEN 10-325 MG PO TABS: 1.0000 | ORAL_TABLET | Freq: Four times a day (QID) | ORAL | Status: DC | PRN

## 2015-09-30 MED ORDER — MIDAZOLAM HCL 2 MG/2ML IJ SOLN
INTRAMUSCULAR | Status: AC
  Filled 2015-09-30: qty 2

## 2015-09-30 MED ORDER — PROPOFOL 500 MG/50ML IV EMUL
INTRAVENOUS | Status: AC
  Filled 2015-09-30: qty 50

## 2015-09-30 MED ORDER — BUPIVACAINE HCL (PF) 0.25 % IJ SOLN
INTRAMUSCULAR | Status: DC | PRN
  Administered 2015-09-30: 6 mL

## 2015-09-30 MED ORDER — HYDROMORPHONE HCL 1 MG/ML IJ SOLN
0.2500 mg | INTRAMUSCULAR | Status: DC | PRN
  Administered 2015-09-30: 0.5 mg via INTRAVENOUS

## 2015-09-30 MED ORDER — GLYCOPYRROLATE 0.2 MG/ML IJ SOLN: 0.2000 mg | Freq: Once | INTRAMUSCULAR | Status: DC | PRN

## 2015-09-30 MED ORDER — LACTATED RINGERS IV SOLN: INTRAVENOUS | Status: DC

## 2015-09-30 MED ORDER — DEXAMETHASONE SODIUM PHOSPHATE 4 MG/ML IJ SOLN
INTRAMUSCULAR | Status: DC | PRN
  Administered 2015-09-30: 10 mg via INTRAVENOUS

## 2015-09-30 MED ORDER — DEXAMETHASONE SODIUM PHOSPHATE 10 MG/ML IJ SOLN
INTRAMUSCULAR | Status: AC
  Filled 2015-09-30: qty 1

## 2015-09-30 MED ORDER — CEFAZOLIN SODIUM-DEXTROSE 2-3 GM-% IV SOLR
INTRAVENOUS | Status: AC
  Filled 2015-09-30: qty 50

## 2015-09-30 MED ORDER — ONDANSETRON HCL 4 MG/2ML IJ SOLN
INTRAMUSCULAR | Status: AC
  Filled 2015-09-30: qty 2

## 2015-09-30 MED ORDER — LACTATED RINGERS IV SOLN
INTRAVENOUS | Status: DC
  Administered 2015-09-30: 08:00:00 via INTRAVENOUS

## 2015-09-30 MED ORDER — LIDOCAINE HCL (CARDIAC) 20 MG/ML IV SOLN
INTRAVENOUS | Status: DC | PRN
  Administered 2015-09-30: 80 mg via INTRAVENOUS

## 2015-09-30 MED ORDER — HYDROMORPHONE HCL 1 MG/ML IJ SOLN
INTRAMUSCULAR | Status: AC
  Filled 2015-09-30: qty 1

## 2015-09-30 MED ORDER — PROPOFOL 10 MG/ML IV BOLUS
INTRAVENOUS | Status: DC | PRN
  Administered 2015-09-30: 200 mg via INTRAVENOUS

## 2015-09-30 MED ORDER — BUPIVACAINE-EPINEPHRINE (PF) 0.5% -1:200000 IJ SOLN
INTRAMUSCULAR | Status: DC | PRN
  Administered 2015-09-30: 30 mL

## 2015-09-30 SURGICAL SUPPLY — 46 items
APPLIER CLIP 9.375 MED OPEN (MISCELLANEOUS) ×3
APR CLP MED 9.3 20 MLT OPN (MISCELLANEOUS) ×1
BINDER BREAST MEDIUM (GAUZE/BANDAGES/DRESSINGS) ×2 IMPLANT
BLADE SURG 15 STRL LF DISP TIS (BLADE) ×1 IMPLANT
BLADE SURG 15 STRL SS (BLADE) ×3
CANISTER SUCT 1200ML W/VALVE (MISCELLANEOUS) ×3 IMPLANT
CHLORAPREP W/TINT 26ML (MISCELLANEOUS) ×3 IMPLANT
CLIP APPLIE 9.375 MED OPEN (MISCELLANEOUS) ×1 IMPLANT
CLOSURE WOUND 1/2 X4 (GAUZE/BANDAGES/DRESSINGS) ×1
COVER BACK TABLE 60X90IN (DRAPES) ×3 IMPLANT
COVER MAYO STAND STRL (DRAPES) ×3 IMPLANT
COVER PROBE W GEL 5X96 (DRAPES) ×3 IMPLANT
DEVICE DUBIN W/COMP PLATE 8390 (MISCELLANEOUS) ×2 IMPLANT
DRAPE LAPAROSCOPIC ABDOMINAL (DRAPES) ×2 IMPLANT
ELECT COATED BLADE 2.86 ST (ELECTRODE) ×5 IMPLANT
ELECT REM PT RETURN 9FT ADLT (ELECTROSURGICAL) ×3
ELECTRODE REM PT RTRN 9FT ADLT (ELECTROSURGICAL) ×1 IMPLANT
GLOVE BIO SURGEON STRL SZ7 (GLOVE) ×5 IMPLANT
GLOVE BIOGEL M 7.0 STRL (GLOVE) ×2 IMPLANT
GLOVE BIOGEL PI IND STRL 7.5 (GLOVE) ×1 IMPLANT
GLOVE BIOGEL PI INDICATOR 7.5 (GLOVE) ×8
GLOVE SURG SS PI 7.0 STRL IVOR (GLOVE) ×4 IMPLANT
GOWN STRL REUS W/ TWL LRG LVL3 (GOWN DISPOSABLE) ×3 IMPLANT
GOWN STRL REUS W/TWL LRG LVL3 (GOWN DISPOSABLE) ×12
HEMOSTAT ARISTA ABSORB 3G PWDR (MISCELLANEOUS) IMPLANT
ILLUMINATOR WAVEGUIDE N/F (MISCELLANEOUS) ×2 IMPLANT
KIT MARKER MARGIN INK (KITS) ×3 IMPLANT
LIQUID BAND (GAUZE/BANDAGES/DRESSINGS) ×2 IMPLANT
NDL HYPO 25X1 1.5 SAFETY (NEEDLE) ×1 IMPLANT
NEEDLE HYPO 25X1 1.5 SAFETY (NEEDLE) ×3 IMPLANT
PACK BASIN DAY SURGERY FS (CUSTOM PROCEDURE TRAY) ×3 IMPLANT
PENCIL BUTTON HOLSTER BLD 10FT (ELECTRODE) ×5 IMPLANT
SLEEVE SCD COMPRESS KNEE MED (MISCELLANEOUS) ×3 IMPLANT
SPONGE LAP 4X18 X RAY DECT (DISPOSABLE) ×3 IMPLANT
STRIP CLOSURE SKIN 1/2X4 (GAUZE/BANDAGES/DRESSINGS) ×1 IMPLANT
SUT MNCRL AB 4-0 PS2 18 (SUTURE) ×3 IMPLANT
SUT VIC AB 2-0 SH 27 (SUTURE) ×6
SUT VIC AB 2-0 SH 27XBRD (SUTURE) ×1 IMPLANT
SUT VIC AB 3-0 SH 27 (SUTURE) ×6
SUT VIC AB 3-0 SH 27X BRD (SUTURE) ×1 IMPLANT
SYR CONTROL 10ML LL (SYRINGE) ×3 IMPLANT
TOWEL OR 17X24 6PK STRL BLUE (TOWEL DISPOSABLE) ×3 IMPLANT
TOWEL OR NON WOVEN STRL DISP B (DISPOSABLE) ×3 IMPLANT
TUBE CONNECTING 20'X1/4 (TUBING) ×1
TUBE CONNECTING 20X1/4 (TUBING) ×2 IMPLANT
YANKAUER SUCT BULB TIP NO VENT (SUCTIONS) ×3 IMPLANT

## 2015-09-30 NOTE — Anesthesia Preprocedure Evaluation (Addendum)
Anesthesia Evaluation  Patient identified by MRN, date of birth, ID band Patient awake    Reviewed: Allergy & Precautions, NPO status , Patient's Chart, lab work & pertinent test results  Airway Mallampati: III  TM Distance: >3 FB Neck ROM: Full    Dental  (+) Teeth Intact   Pulmonary former smoker,    breath sounds clear to auscultation       Cardiovascular negative cardio ROS   Rhythm:Regular Rate:Normal     Neuro/Psych PSYCHIATRIC DISORDERS Anxiety    GI/Hepatic negative GI ROS, Neg liver ROS,   Endo/Other  negative endocrine ROS  Renal/GU negative Renal ROS  negative genitourinary   Musculoskeletal  (+) Arthritis ,   Abdominal   Peds negative pediatric ROS (+)  Hematology negative hematology ROS (+)   Anesthesia Other Findings   Reproductive/Obstetrics negative OB ROS                            Lab Results  Component Value Date   WBC 7.8 09/27/2015   HGB 12.3 09/27/2015   HCT 38.2 09/27/2015   MCV 91.8 09/27/2015   PLT 205 09/27/2015   Lab Results  Component Value Date   CREATININE 0.85 09/27/2015   BUN 9 09/27/2015   NA 144 09/27/2015   K 3.3* 09/27/2015   CL 106 09/27/2015   CO2 29 09/27/2015   No results found for: INR, PROTIME   Anesthesia Physical Anesthesia Plan  ASA: II  Anesthesia Plan: General   Post-op Pain Management: GA combined w/ Regional for post-op pain   Induction: Intravenous  Airway Management Planned: LMA  Additional Equipment:   Intra-op Plan:   Post-operative Plan: Extubation in OR  Informed Consent: I have reviewed the patients History and Physical, chart, labs and discussed the procedure including the risks, benefits and alternatives for the proposed anesthesia with the patient or authorized representative who has indicated his/her understanding and acceptance.   Dental advisory given  Plan Discussed with: CRNA  Anesthesia Plan  Comments:         Anesthesia Quick Evaluation

## 2015-09-30 NOTE — H&P (Signed)
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 her density is D. she has family history in her mother. she has prior history of open right biopsy many years ago. her mm showed 4 mm area of calcs in ruoq. Left side ends up being nothing. underwent stereo biopsy of right with clip placement. also has had mri that shows right breast with 5 cm hematoma but no other abnormality. the left breast was negative. nodes all normal. core shows grade I idc with dcis and alh, her 2 negative. er pos at 40, pr pos at 56, Ki is 10%. she has no complaints referable to either breast. she is here today to discuss options.   Other Problems Illene Regulus, CMA; 09/09/2015 9:27 AM) Arthritis Hypercholesterolemia Lump In Breast  Past Surgical History Lars Mage Spillers, CMA; 09/09/2015 9:27 AM) Breast Biopsy Right. Knee Surgery Right. Oral Surgery Tonsillectomy  Diagnostic Studies History Illene Regulus, CMA; 09/09/2015 9:27 AM) Colonoscopy 1-5 years ago Mammogram within last year Pap Smear 1-5 years ago  Allergies Illene Regulus, CMA; 09/09/2015 9:28 AM) CeleBREX *ANALGESICS - ANTI-INFLAMMATORY*  Medication History (Alisha Spillers, CMA; 09/09/2015 9:29 AM) Lexapro (5MG  Tablet, Oral) Active. Atorvastatin Calcium (10MG  Tablet, Oral) Active. Medications Reconciled  Social History Illene Regulus, CMA; 09/09/2015 9:27 AM) Alcohol use Moderate alcohol use. Caffeine use Coffee. No drug use Tobacco use Former smoker.  Family History Illene Regulus, [place]; 09/09/2015 9:27 AM) Arthritis Mother. Breast Cancer Mother. Heart Disease Mother.  Pregnancy / Birth History Illene Regulus, CMA; 09/09/2015 9:27 AM) Age at menarche 89 years. Age of menopause 51-55 Contraceptive History Oral contraceptives. Gravida 2 Maternal age 75-30 Para 2    Review of Systems Lars Mage  Spillers CMA; 09/09/2015 9:27 AM) General Not Present- Appetite Loss, Chills, Fatigue, Fever, Night Sweats, Weight Gain and Weight Loss. Skin Not Present- Change in Wart/Mole, Dryness, Hives, Jaundice, New Lesions, Non-Healing Wounds, Rash and Ulcer. HEENT Present- Hearing Loss and Seasonal Allergies. Not Present- Earache, Hoarseness, Nose Bleed, Oral Ulcers, Ringing in the Ears, Sinus Pain, Sore Throat, Visual Disturbances, Wears glasses/contact lenses and Yellow Eyes. Respiratory Not Present- Bloody sputum, Chronic Cough, Difficulty Breathing, Snoring and Wheezing. Breast Not Present- Breast Mass, Breast Pain, Nipple Discharge and Skin Changes. Cardiovascular Not Present- Chest Pain, Difficulty Breathing Lying Down, Leg Cramps, Palpitations, Rapid Heart Rate, Shortness of Breath and Swelling of Extremities. Gastrointestinal Present- Hemorrhoids. Not Present- Abdominal Pain, Bloating, Bloody Stool, Change in Bowel Habits, Chronic diarrhea, Constipation, Difficulty Swallowing, Excessive gas, Gets full quickly at meals, Indigestion, Nausea, Rectal Pain and Vomiting. Female Genitourinary Not Present- Frequency, Nocturia, Painful Urination, Pelvic Pain and Urgency. Musculoskeletal Present- Joint Stiffness. Not Present- Back Pain, Joint Pain, Muscle Pain, Muscle Weakness and Swelling of Extremities. Neurological Not Present- Decreased Memory, Fainting, Headaches, Numbness, Seizures, Tingling, Tremor, Trouble walking and Weakness. Psychiatric Not Present- Anxiety, Bipolar, Change in Sleep Pattern, Depression, Fearful and Frequent crying. Endocrine Not Present- Cold Intolerance, Excessive Hunger, Hair Changes, Heat Intolerance, Hot flashes and New Diabetes. Hematology Not Present- Easy Bruising, Excessive bleeding, Gland problems, HIV and Persistent Infections.  Vitals (Alisha Spillers CMA; 09/09/2015 9:28 AM) 09/09/2015 9:27 AM Weight: 137.6 lb Height: 65.5in Body Surface Area: 1.7 m Body Mass Index:  22.55 kg/m  Pulse: 68 (Regular)  BP: 116/78 (Sitting, Left Arm, Standard)       Physical Exam Rolm Bookbinder MD; 09/09/2015 10:33 AM) General Mental Status-Alert. Orientation-Oriented X3.  Chest and Lung  Exam Chest and lung exam reveals -on auscultation, normal breath sounds, no adventitious sounds and normal vocal resonance.  Breast Nipples-No Discharge. Breast Lump-No Palpable Breast Mass. Note: large right breast hematoma   Cardiovascular Cardiovascular examination reveals -normal heart sounds, regular rate and rhythm with no murmurs.  Lymphatic Head & Neck  General Head & Neck Lymphatics: Bilateral - Description - Normal. Axillary  General Axillary Region: Bilateral - Description - Normal. Note: no [place] adenopathy     Assessment & Plan Rolm Bookbinder MD; 09/09/2015 10:43 AM) BREAST CANCER OF UPPER-OUTER QUADRANT OF RIGHT FEMALE BREAST (C50.411) Story: Right breast wire guided lumpectomy, right axillary sn biopsy  We discussed the staging and pathophysiology of breast cancer. We discussed all of the different options for treatment for breast cancer including surgery, chemotherapy, radiation therapy, Herceptin, and antiestrogen therapy. We discussed oncotype. We discussed a sentinel lymph node biopsy as she does not appear to having lymph node involvement right now. We discussed the performance of that with injection of radioactive tracer. We discussed that she would have an incision underneath her axillary hairline. We discussed that there is a chance of having a positive node with a sentinel lymph node biopsy and we will await the permanent pathology to make any other first further decisions in terms of her treatment.We discussed up to a 5% risk lifetime of chronic shoulder pain as well as lymphedema associated with a sentinel lymph node biopsy. We discussed the options for treatment of the breast cancer which included lumpectomy versus a mastectomy.  We discussed the performance of the lumpectomy with radioactive seed placement. We discussed a 5-10% chance of a positive margin requiring reexcision in the operating room. We also discussed that she will need radiation therapy (this is usually 5-7 weeks) if she undergoes lumpectomy. We discussed the mastectomy (removal of whole breast) and the postoperative care for that as well. Mastectomy can be followed by reconstruction. This is a more extensive surgery and requires more recovery. The decision for lumpectomy vs mastectomy has no impact on decision for chemotherapy. Most mastectomy patients will not need radiation therapy. We discussed that there is no difference in her survival whether she undergoes lumpectomy with radiation therapy or antiestrogen therapy versus a mastectomy. There is also no real difference in local recurrence She understands further therapy based on results of surgery

## 2015-09-30 NOTE — Progress Notes (Signed)
Assisted Dr. Smith Robert with right, ultrasound guided, pectoralis block. Side rails up, monitors on throughout procedure. See vital signs in flow sheet. Tolerated Procedure well.

## 2015-09-30 NOTE — Anesthesia Procedure Notes (Addendum)
Anesthesia Regional Block:  Pectoralis block  Pre-Anesthetic Checklist: ,, timeout performed, Correct Patient, Correct Site, Correct Laterality, Correct Procedure, Correct Position, site marked, Risks and benefits discussed,  Surgical consent,  Pre-op evaluation,  At surgeon's request and post-op pain management  Laterality: Right  Prep: chloraprep       Needles:  Injection technique: Single-shot  Needle Type: Echogenic Needle     Needle Length: 9cm 9 cm Needle Gauge: 21 and 21 G    Additional Needles:  Procedures: ultrasound guided (picture in chart) Pectoralis block Narrative:  Start time: 09/30/2015 8:35 AM End time: 09/30/2015 8:37 AM Injection made incrementally with aspirations every 5 mL.  Performed by: Personally  Anesthesiologist: Suella Broad D  Additional Notes: No immediate complications noted.    Procedure Name: LMA Insertion Date/Time: 09/30/2015 9:07 AM Performed by: Lieutenant Diego Pre-anesthesia Checklist: Patient identified, Emergency Drugs available, Suction available and Patient being monitored Patient Re-evaluated:Patient Re-evaluated prior to inductionOxygen Delivery Method: Circle System Utilized Preoxygenation: Pre-oxygenation with 100% oxygen Intubation Type: IV induction Ventilation: Mask ventilation without difficulty LMA: LMA inserted LMA Size: 4.0 Number of attempts: 1 Airway Equipment and Method: Bite block Placement Confirmation: positive ETCO2 and breath sounds checked- equal and bilateral Tube secured with: Tape Dental Injury: Teeth and Oropharynx as per pre-operative assessment

## 2015-09-30 NOTE — Op Note (Signed)
Preoperative diagnosis: clinical stage I right breast cancer Postoperative diagnosis: same as above Procedure: 1. Right breast wire guided lumpectomy 2. Right axillary sn biopsy Surgeon Dr Serita Grammes Anes general with pectoral block EBL: minimal Comps none Specimen:  1. Right breast marked with paint 2. Sentinel nodes with highest count of 243 Sponge count correct at completion dispo to recovery stable  Indications: This is a 84 yof who has a clinical stage I right breast cancer and was seen in our multidisciplinary clinic.  Discussed proceeding with lumpectomy/sn biopsy. She had wire placed prior to beginning due to presence of large hematoma. I had these mm in the OR.   Procedure: After informed consent was obtained the patient was taken to the OR. She was injected with technetium in the standard periareolar fashion. She underwent a pectoral block. She was given ancef. SCDs were in place. She was prepped and draped in the standard sterile surgical fashion. A timeout was performed. I then made an axillary incision in the right breast.  I then removed the wire with an attempt to get a clear margin.  I did confirm removal of the wire with mammography.The posterior margin is the muscle and the anterior margin is the skin.  I placed clips in the cavity. I closed the deep tissue with 2-0 vicryl. I placed arista in the cavity.  I used the same incision for the sentinel node biopsy.  I went through the axillary fascia. I was able to locate what appeared to be several sentinel nodes with highest count listed above. There were no more palpable or radioactive nodes present. I obtained hemostasis.I did place arista in the wound. I then closed the fascia with 2-0 vicryl The skin was closed with 3-0 vicryl and 4-0 monocryl. Glue and steristrips were placed A breast binder was placed. She was extubated and transferred to recovery stable

## 2015-09-30 NOTE — Interval H&P Note (Signed)
History and Physical Interval Note:  09/30/2015 8:52 AM  Tina Nielsen  has presented today for surgery, with the diagnosis of RIGHT BREAST CANCER  The various methods of treatment have been discussed with the patient and family. After consideration of risks, benefits and other options for treatment, the patient has consented to  Procedure(s): RIGHT BREAST LUMPECTOMY WITH NEEDLE LOCALIZATION AND AXILLARY SENTINEL LYMPH NODE BX (Right) as a surgical intervention .  The patient's history has been reviewed, patient examined, no change in status, stable for surgery.  I have reviewed the patient's chart and labs.  Questions were answered to the patient's satisfaction.     Shayla Heming

## 2015-09-30 NOTE — Discharge Instructions (Signed)
[facility] °Office Phone Number [phone] ° °POST OP INSTRUCTIONS ° °Always review your discharge instruction sheet given to you by the facility where your surgery was performed. ° °IF YOU HAVE DISABILITY OR FAMILY LEAVE FORMS, YOU MUST BRING THEM TO THE OFFICE FOR PROCESSING.  DO NOT GIVE THEM TO YOUR DOCTOR. ° °1. A prescription for pain medication may be given to you upon discharge.  Take your pain medication as prescribed, if needed.  If narcotic pain medicine is not needed, then you may take acetaminophen (Tylenol), naprosyn (Alleve) or ibuprofen (Advil) as needed. °2. Take your usually prescribed medications unless otherwise directed °3. If you need a refill on your pain medication, please contact your pharmacy.  They will contact our office to request authorization.  Prescriptions will not be filled after 5pm or on week-ends. °4. You should eat very light the first 24 hours after surgery, such as soup, crackers, pudding, etc.  Resume your normal diet the day after surgery. °5. Most patients will experience some swelling and bruising in the breast.  Ice packs and a good support bra will help.  Wear the breast binder provided or a sports bra for 72 hours day and night.  After that wear a sports bra during the day until you return to the office. Swelling and bruising can take several days to resolve.  °6. It is common to experience some constipation if taking pain medication after surgery.  Increasing fluid intake and taking a stool softener will usually help or prevent this problem from occurring.  A mild laxative (Milk of Magnesia or Miralax) should be taken according to package directions if there are no bowel movements after 48 hours. °7. Unless discharge instructions indicate otherwise, you may remove your bandages 48 hours after surgery and you may shower at that time.  You may have steri-strips (small skin tapes) in place directly over the incision.  These strips should be left on the  skin for 7-10 days and will come off on their own.  If your surgeon used skin glue on the incision, you may shower in 24 hours.  The glue will flake off over the next 2-3 weeks.  Any sutures or staples will be removed at the office during your follow-up visit. °8. ACTIVITIES:  You may resume regular daily activities (gradually increasing) beginning the next day.  Wearing a good support bra or sports bra minimizes pain and swelling.  You may have sexual intercourse when it is comfortable. °a. You may drive when you no longer are taking prescription pain medication, you can comfortably wear a seatbelt, and you can safely maneuver your car and apply brakes. °b. RETURN TO WORK:  ______________________________________________________________________________________ °9. You should see your doctor in the office for a follow-up appointment approximately two weeks after your surgery.  Your doctor’s nurse will typically make your follow-up appointment when she calls you with your pathology report.  Expect your pathology report 3-4 business days after your surgery.  You may call to check if you do not hear from us after three days. °10. OTHER INSTRUCTIONS: _______________________________________________________________________________________________ _____________________________________________________________________________________________________________________________________ °_____________________________________________________________________________________________________________________________________ °_____________________________________________________________________________________________________________________________________ ° °WHEN TO CALL DR WAKEFIELD: °1. Fever over 101.0 °2. Nausea and/or vomiting. °3. Extreme swelling or bruising. °4. Continued bleeding from incision. °5. Increased pain, redness, or drainage from the incision. ° °The clinic staff is available to answer your questions during regular  business hours.  Please don’t hesitate to call and ask to speak to one of the nurses for clinical concerns.  If   you have a medical emergency, go to the nearest emergency room or call 911.  A surgeon from [facility] is always on call at the hospital. ° °For further questions, please visit [email or web address] ° ° ° °Post Anesthesia Home Care Instructions ° °Activity: °Get plenty of rest for the remainder of the day. A responsible adult should stay with you for 24 hours following the procedure.  °For the next 24 hours, DO NOT: °-Drive a car °-Operate machinery °-Drink alcoholic beverages °-Take any medication unless instructed by your physician °-Make any legal decisions or sign important papers. ° °Meals: °Start with liquid foods such as gelatin or soup. Progress to regular foods as tolerated. Avoid greasy, spicy, heavy foods. If nausea and/or vomiting occur, drink only clear liquids until the nausea and/or vomiting subsides. Call your physician if vomiting continues. ° °Special Instructions/Symptoms: °Your throat may feel dry or sore from the anesthesia or the breathing tube placed in your throat during surgery. If this causes discomfort, gargle with warm salt water. The discomfort should disappear within 24 hours. ° °If you had a scopolamine patch placed behind your ear for the management of post- operative nausea and/or vomiting: ° °1. The medication in the patch is effective for 72 hours, after which it should be removed.  Wrap patch in a tissue and discard in the trash. Wash hands thoroughly with soap and water. °2. You may remove the patch earlier than 72 hours if you experience unpleasant side effects which may include dry mouth, dizziness or visual disturbances. °3. Avoid touching the patch. Wash your hands with soap and water after contact with the patch. °  ° °

## 2015-09-30 NOTE — Progress Notes (Signed)
nuc med staff performed injection. Pt tol well. VSS. Additional sedation given. Will call husband to bedside and update/provide emotional support

## 2015-09-30 NOTE — Transfer of Care (Signed)
Immediate Anesthesia Transfer of Care Note  Patient: Tina Nielsen  Procedure(s) Performed: Procedure(s): RIGHT BREAST LUMPECTOMY WITH NEEDLE LOCALIZATION AND AXILLARY SENTINEL LYMPH NODE BX (Right)  Patient Location: PACU  Anesthesia Type:General  Level of Consciousness: sedated  Airway & Oxygen Therapy: Patient Spontanous Breathing and Patient connected to face mask oxygen  Post-op Assessment: Report given to RN and Post -op Vital signs reviewed and stable  Post vital signs: Reviewed and stable  Last Vitals:  Filed Vitals:   09/30/15 0840 09/30/15 0845  BP: 125/67 128/68  Pulse: 77 80  Temp:    Resp: 14 23    Complications: No apparent anesthesia complications

## 2015-10-01 ENCOUNTER — Encounter (HOSPITAL_BASED_OUTPATIENT_CLINIC_OR_DEPARTMENT_OTHER): Payer: Self-pay | Admitting: General Surgery

## 2015-10-01 NOTE — Anesthesia Postprocedure Evaluation (Signed)
Anesthesia Post Note  Patient: Tina Nielsen  Procedure(s) Performed: Procedure(s) (LRB): RIGHT BREAST LUMPECTOMY WITH NEEDLE LOCALIZATION AND AXILLARY SENTINEL LYMPH NODE BX (Right)  Patient location during evaluation: PACU Anesthesia Type: General Level of consciousness: awake and alert Pain management: pain level controlled Vital Signs Assessment: post-procedure vital signs reviewed and stable Respiratory status: spontaneous breathing, nonlabored ventilation, respiratory function stable and patient connected to nasal cannula oxygen Cardiovascular status: blood pressure returned to baseline and stable Postop Assessment: no signs of nausea or vomiting Anesthetic complications: no    Last Vitals:  Filed Vitals:   09/30/15 1145 09/30/15 1215  BP:  137/68  Pulse: 70 70  Temp: 36.9 C 36.9 C  Resp: 18 18    Last Pain:  Filed Vitals:   09/30/15 1221  PainSc: 2                  Effie Berkshire

## 2015-10-04 ENCOUNTER — Encounter: Payer: Self-pay | Admitting: Women's Health

## 2015-10-11 ENCOUNTER — Encounter: Payer: Self-pay | Admitting: General Surgery

## 2015-10-14 NOTE — Progress Notes (Signed)
Location of Breast Cancer:Breast cancer of upper-outer quadrant of right female breast   Histology per Pathology Report:  Diagnosis  09-30-15 1. Breast, lumpectomy, right - DUCTAL CARCINOMA IN SITU WITH CALCIFICATIONS, INTERMEDIATE GRADE. - NO DEFINITIVE RESIDUAL INVASIVE DUCTAL CARCINOMA. - BIOPSY SITE CHANGE AND HEMATOMA. - RESECTION MARGINS ARE NEGATIVE FOR CARCINOMA. - SEE ONCOLOGY TABLE. 2. Lymph node, sentinel, biopsy, right axillary - ONE OF ONE LYMPH NODES NEGATIVE FOR CARCINOMA (0/1). 3. Lymph node, sentinel, biopsy, right axillary - ONE OF ONE LYMPH NODES NEGATIVE FOR CARCINOMA (0/1). 4. Lymph node, sentinel, biopsy, right axillary - ONE OF ONE LYMPH NODES NEGATIVE FOR CARCINOMA (0/1). Microscopic Comment 1. Primary invasive tumor information is from biopsy [phone]. BREAST, INVASIVE TUMOR, WITH LYMPH NODES PRESENT Specimen, including laterality and lymph node sampling (sentinel, non-sentinel): Right Receptor Status: ER(100%), PR (90%), Her2-neu (-),Ki-67(10%)  [name] presenedt with a lump left breast in October 2016 had a abnormal mammogram.  Past/Anticipated interventions by surgeon, if any:Dr. Serita Grammes   1.Right breast wire guided lumpectomy                                                                                                                 2. Right axillary sn biopsy Past/Anticipated interventions by medical oncology, if any: adjuvant radiation to follow surgery,anti-estrogens to follow completion of local treatment Mammogram 09-30-15 [facility] Lymphedema issues, if any:  No  Pain issues, if any:  No Skin to right breast incision area healing without signs of infection, has tenderness and numbness to the right breast.  SAFETY ISSUES:  Prior radiation? No  Pacemaker/ICD? No  Possible current pregnancy?{:No  Is the patient on methotrexate? No  Current Complaints / other details: Here for post surgery and genetic testing discussion and any  questions from previous radiation discussion.  Menarche age 40, G20, P20, Menopause age 68, BC 10-15 years, HRT None  BP 127/63 mmHg  Pulse 65  Temp(Src) 97.8 F (36.6 C) (Oral)  Resp 18  Ht _0  (1.651 m)  Wt 141 lb 8 oz (64.184 kg)  BMI 23.55 kg/m2  SpO2 100% Georgena Spurling, RN 10/14/2015,8:11 AM

## 2015-10-20 ENCOUNTER — Ambulatory Visit: Payer: Self-pay | Admitting: Genetic Counselor

## 2015-10-20 ENCOUNTER — Encounter: Payer: Self-pay | Admitting: Genetic Counselor

## 2015-10-20 ENCOUNTER — Ambulatory Visit
Admission: RE | Admit: 2015-10-20 | Discharge: 2015-10-20 | Disposition: A | Discharge status: Home / Self Care | Payer: Managed Care, Other (non HMO) | Source: Ambulatory Visit | Attending: Radiation Oncology | Admitting: Radiation Oncology

## 2015-10-20 ENCOUNTER — Telehealth: Payer: Self-pay | Admitting: Genetic Counselor

## 2015-10-20 ENCOUNTER — Encounter: Payer: Self-pay | Admitting: Radiation Oncology

## 2015-10-20 VITALS — BP 127/63 | HR 65 | Temp 97.8°F | Resp 18 | Ht 65.0 in | Wt 141.5 lb

## 2015-10-20 DIAGNOSIS — Z1379 Encounter for other screening for genetic and chromosomal anomalies: Secondary | ICD-10-CM | POA: Insufficient documentation

## 2015-10-20 DIAGNOSIS — Z803 Family history of malignant neoplasm of breast: Secondary | ICD-10-CM

## 2015-10-20 DIAGNOSIS — C50411 Malignant neoplasm of upper-outer quadrant of right female breast: Secondary | ICD-10-CM

## 2015-10-20 DIAGNOSIS — C50919 Malignant neoplasm of unspecified site of unspecified female breast: Secondary | ICD-10-CM

## 2015-10-20 DIAGNOSIS — Z510 Encounter for antineoplastic radiation therapy: Secondary | ICD-10-CM | POA: Diagnosis not present

## 2015-10-20 NOTE — Progress Notes (Signed)
   Department of Radiation Oncology  Phone:  [phone] Fax:        [phone]   Name: Tina Nielsen MRN: [id number]  DOB: 18-Feb-1951  Date: 10/20/2015  Follow Up Visit Note  Diagnosis: Breast cancer of upper-outer quadrant of right female breast [facility])   Staging form: Breast, AJCC 7th Edition     Clinical: Stage IA (T1a, N0, M0) - Signed by Chauncey Cruel, MD on 09/16/2015  Interval History: Hope presents today for a follow up prior to radiation treatment. I initially met with her on 09/15/2015. She underwent a lumpectomy on 09/30/2015. This showed intermediate grade DCIS with negative margins. The DCIS spanned about 2 mm. Three sentinel lymph nodes were negative. She has recovered well from surgery. She met with Santiago Glad today to discuss genetic testing results.   Today, she reports that she was previously experiencing swelling and numbness in the right arm; however this has improved significantly. Skin to the right breast incision area is healing without signs of infection. She reports tenderness and numbness to the right breast.  Physical Exam:  Filed Vitals:   10/20/15 1633  BP: 127/63  Pulse: 65  Temp: 97.8 F (36.6 C)  TempSrc: Oral  Resp: 18  Height: '5\' 5"'$  (1.651 m)  Weight: 141 lb 8 oz (64.184 kg)  SpO2: 100%    Her incision is healing well with steristrips in place.  IMPRESSION: [name] is a 65 y.o. female T1 N0 right breast cancer.  PLAN: I spoke to the patient today regarding her diagnosis and options for treatment. We discussed the equivalence in terms of survival and local failure between mastectomy and breast conservation. We discussed the role of radiation in decreasing local failures in patients who undergo lumpectomy. We discussed the process of simulation and the placement tattoos. We discussed 16 treatments as an outpatient. We discussed the possibility of asymptomatic lung damage. We discussed the low likelihood of secondary malignancies. We  discussed the possible side effects including but not limited to skin redness, fatigue, permanent skin darkening, and breast swelling. CT simulation has been scheduled for 10/26/2015 at 1 pm. The patient has signed informed consent and is ready to proceed with radiation treatment. Due to Ms. Boback' work schedule, I have given permission for her radiation treatment start date to be delayed until 4:00 pm time slot is available.  ------------------------------------------------  Thea Silversmith, MD  This document serves as a record of services personally performed by Thea Silversmith, MD. It was created on her behalf by Jenell Milliner, a trained medical scribe. The creation of this record is based on the scribe's personal observations and the provider's statements to them. This document has been checked and approved by the attending provider.

## 2015-10-20 NOTE — Telephone Encounter (Signed)
LM on VM that results are back and to please call back to discuss.

## 2015-10-20 NOTE — Progress Notes (Signed)
HPI: Ms. Chandra was previously seen in the [place] clinic due to a personal and family history of breast cancer and concerns regarding a hereditary predisposition to cancer. Please refer to our prior cancer genetics clinic note for more information regarding Ms. Kosanke's medical, social and family histories, and our assessment and recommendations, at the time. Ms. Mcclane recent genetic test results were disclosed to her, as were recommendations warranted by these results. These results and recommendations are discussed in more detail below.  FAMILY HISTORY:  We obtained a detailed, 4-generation family history.  Significant diagnoses are listed below: Family History  Problem Relation Age of Onset  . Hypertension Mother   . Breast cancer Mother 32    second cancer at 23  . Heart disease Mother   . Aneurysm Father     AORTIC  . Heart disease Maternal Grandfather   . Colon cancer Neg Hx   . Kidney cancer Maternal Aunt 46  . Prostate cancer Paternal Uncle 23  . Stroke Maternal Grandmother   . Breast cancer Other     MGMs sister  . Breast cancer Other     PGMs sister  . Breast cancer Other     PGF sister    The patient has two sons who are healthy. She has one brother who is cancer free. Her mother is alive at 58. She was diagnosed with breast cancer at 79 and again at 54. Her mother had one brother and two sisters, one sister had kidney cancer and died at 58. The patient's maternal grandparents died of a stroke and heart disease. The patient's maternal grandmothers sister had breast cancer. TEh patient's father died of an aortic aneurysm. He had two brothers and two sisters. One brother died as a child and the other died from prostate cancer at 30. Her paternal grandparents died natural causes. Both paternal grandparents ahd one sister each who had breast cancer. Patient's maternal ancestors are of [place] descent, and paternal ancestors are of [place] descent.  There is no reported Ashkenazi Jewish ancestry. There is no known consanguinity.  GENETIC TEST RESULTS: At the time of Ms. Byland's visit, we recommended she pursue genetic testing of the Breast/Ovarian cancer gene panel. The Breast/Ovarian gene panel offered by [facility] includes sequencing and rearrangement analysis for the following 20 genes:  ATM, BARD1, BRCA1, BRCA2, BRIP1, CDH1, CHEK2, EPCAM, FANCC, MLH1, MSH2, MSH6, NBN, PALB2, PMS2, PTEN, RAD51C, RAD51D, TP53, and XRCC2.   The report date is October 19, 2015.  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 The test report has been scanned into EPIC and is located under the Molecular Pathology section of the Results Review tab.   Genetic testing also detected a Variant of Unknown Significance in the BRCA2 gene called [id number]. At this time, it is unknown if this variant is associated with increased cancer risk or if this is a normal finding, but most variants such as this get reclassified to being inconsequential. It should not be used to make medical management decisions. With time, we suspect the lab will determine the significance of this variant, if any. If we do learn more about it, we will try to contact Ms. Trentham to discuss it further. However, it is important to stay in touch with [place] periodically and keep the address and phone number up to date.   DISCUSSION: CHEK2 mutations have been found to be associated with an increased risk of breast and other cancers. The estimated cancer  risks vary widely and may be influenced by family history. Women with a CHEK2 deleterious mutation have approximately a 24% (no family history of breast cancer) to 48% (strong family history of breast cancer) lifetime risk of breast cancer and up to a 25% risk of a second breast cancer. Men may have an increased risk for female breast cancer of about 1%. Men and women may have an increased risk of colon cancer (~10% lifetime  risk). According to the NCCN guidelines, individuals with CHEK2 mutations should consider breast MRI's as a part of regular breast cancer screening, and depending on family history could consider a risk-reducing mastectomy.  Breast cancer screening should begin, for women, at age 67 or 39 years younger than the earliest age of onset.  Colon cancer screening should begin at age 42 and continue every 5 years or based on polyp number.    CANCER SCREENING: Below are the NCCN Practice guidelines for women and men.  However, because the breast cancer risks for women and prostate cancer risks for men may be similar, it is appropriate to consider these high risk management recommendations.  Breast Management Options We reviewed the NCCN practice guidelines (v1.2017) for breast management for women at an increased risk of breast cancer because of CHEK2 mutations:   1. Breast awareness (which may include periodic, consistent breast self exam) starting at age 94.  2. Breast screening:  . Starting at age 50, annual mammogram and breast MRI screening, or starting 10 years younger than the earliest age of onset.   Female Breast and Prostate Management Options We reviewed the NCCN practice guidelines (v.1.2017) for female breast and prostate management for men at high risk of female breast and prostate cancer because of BRCA1 or BRCA2 mutations:  1. Breast self-exam training and education starting at age 44.  2. Clinical breast exam, every 6-12 months, starting at age 75 years  63. Consider baseline screening mammogram at age 34 with annual mammograms if gynecomastia or parenchymal/glandular breast density on baseline study.  4. Consider prostate cancer screening starting at age 16 years.   Colon Cancer Management:  Men and women with a deleterious CHEK2 mutation may have up to a 10% lifetime risk for colon cancer. The following is recommended for individuals with a CHEK2 mutation:  Personal history of colon  cancer  Follow instructions provided by your physician based on your personal history.  Do not have a personal history of colon cancer but have a parent/sibling/child with colon cancer: Colonoscopy every 5 years starting at age 7 or 25 years younger than the earliest age of onset, whichever is younger.  Do not have a personal history of colon cancer but do not have a parent/sibling/child with colon cancer: Colonoscopy every 5 years starting at age 41.   FAMILY MEMBERS: It is important that all of Ms. Libman's relatives (both men and women) know of the presence of this gene mutation.  Women need to know that they may be at increased risk for breast and colon cancers.  Men are at slightly increased risk for breast, prostate and colon cancers.  Genetic testing can sort out who in your family is at risk and who is not.  We would be happy to help meet with and coordinate genetic testing for any relative that is interested.  Ms. Dona children and siblings are at 50% risk to have inherited the mutation found in her. We recommend they have genetic testing for this same mutation, as identifying the presence of  this mutation would allow them to also take advantage of risk-reducing measures.   Our knowledge of cancer risks related to CHEK2 mutations will continue to evolve. We recommended that Ms. Doubleday follow up with the genetics clinic annually so we can provide her with the most current information about CHEK2 and cancer risk, as well as with any changes to her family history (new cancer diagnoses, genetic test results).  SUPPORT AND RESOURCES:  We provided information about a support group for hereditary cancer syndrome information and support, Facing Our Risk (www.facingourrisk.com) which some people have found useful.  They provide opportunities to speak with other individuals from high-risk families.     FOLLOW-UP: Lastly, we discussed with Ms. Cerny that cancer genetics is a rapidly advancing  field and it is possible that new genetic tests will be appropriate for her and/or her family members in the future. We encouraged her to remain in contact with cancer genetics on an annual basis so we can update her personal and family histories and let her know of advances in cancer genetics that may benefit this family.   Our contact number was provided. Ms. Mccaughey questions were answered to her satisfaction, and she knows she is welcome to call us at anytime with additional questions or concerns.   Roma Kayser, MS, [facility] Certified Genetic Counselor [email or web address]

## 2015-10-21 NOTE — Addendum Note (Signed)
Encounter addended by: Malena Edman, RN on: 10/21/2015  3:11 PM<BR>     Documentation filed: Charges VN

## 2015-10-26 ENCOUNTER — Ambulatory Visit
Admission: RE | Admit: 2015-10-26 | Discharge: 2015-10-26 | Disposition: A | Discharge status: Home / Self Care | Payer: Managed Care, Other (non HMO) | Source: Ambulatory Visit | Attending: Radiation Oncology | Admitting: Radiation Oncology

## 2015-10-26 DIAGNOSIS — Z510 Encounter for antineoplastic radiation therapy: Secondary | ICD-10-CM | POA: Diagnosis not present

## 2015-10-26 DIAGNOSIS — C50411 Malignant neoplasm of upper-outer quadrant of right female breast: Secondary | ICD-10-CM

## 2015-10-26 NOTE — Progress Notes (Signed)
Name: Tina Nielsen   MRN: [id number]  Date:  10/26/2015  DOB: [birth year]  Status:outpatient    DIAGNOSIS: Breast cancer.  CONSENT VERIFIED: yes   SET UP: Patient is setup supine   IMMOBILIZATION:  The following immobilization was used:Custom Moldable Pillow, breast board.   NARRATIVE: [name] was brought to the [facility].  Identity was confirmed.  All relevant records and images related to the planned course of therapy were reviewed.  Then, the patient was positioned in a stable reproducible clinical set-up for radiation therapy.  Wires were placed to delineate the clinical extent of breast tissue. A wire was placed on the scar as well.  CT images were obtained.  An isocenter was placed. Skin markings were placed.  The CT images were loaded into the planning software where the target and avoidance structures were contoured.  The radiation prescription was entered and confirmed. The patient was discharged in stable condition and tolerated simulation well.    TREATMENT PLANNING NOTE:  Treatment planning then occurred. I have requested : MLC's, isodose plan, basic dose calculation  I personally designed and supervised the construction of 3 medically necessary complex treatment devices for the protection of critical normal structures including the lungs and contralateral breast as well as the immobilization device which is necessary for set up certainty.   3D simulation occurred. I requested and analyzed a dose volume histogram of the heart, lungs and lumpectomy cavity.

## 2015-10-26 NOTE — Progress Notes (Signed)
Radiation Oncology         [phone] ________________________________  Name: Tina Nielsen      MRN: [id number]          Date: 10/26/2015              DOB: May 24, 1951  Optical Surface Tracking Plan:  Since intensity modulated radiotherapy (IMRT) and 3D conformal radiation treatment methods are predicated on accurate and precise positioning for treatment, intrafraction motion monitoring is medically necessary to ensure accurate and safe treatment delivery.  The ability to quantify intrafraction motion without excessive ionizing radiation dose can only be performed with optical surface tracking. Accordingly, surface imaging offers the opportunity to obtain 3D measurements of patient position throughout IMRT and 3D treatments without excessive radiation exposure.  I am ordering optical surface tracking for this patient's upcoming course of radiotherapy. ________________________________ Signature   Reference:   Ursula Alert, J, et al. Surface imaging-based analysis of intrafraction motion for breast radiotherapy patients.Journal of [place], n. 6, nov. 2014. ISSN [id number].   Available at: <http://www.jacmp.org/index.php/jacmp/article/view/4957>.

## 2015-11-02 DIAGNOSIS — Z510 Encounter for antineoplastic radiation therapy: Secondary | ICD-10-CM | POA: Diagnosis not present

## 2015-11-03 ENCOUNTER — Ambulatory Visit
Admission: RE | Admit: 2015-11-03 | Discharge: 2015-11-03 | Disposition: A | Discharge status: Home / Self Care | Payer: Managed Care, Other (non HMO) | Source: Ambulatory Visit | Attending: Radiation Oncology | Admitting: Radiation Oncology

## 2015-11-03 DIAGNOSIS — Z510 Encounter for antineoplastic radiation therapy: Secondary | ICD-10-CM | POA: Diagnosis not present

## 2015-11-04 ENCOUNTER — Ambulatory Visit
Admission: RE | Admit: 2015-11-04 | Discharge: 2015-11-04 | Disposition: A | Discharge status: Home / Self Care | Payer: Managed Care, Other (non HMO) | Source: Ambulatory Visit | Attending: Radiation Oncology | Admitting: Radiation Oncology

## 2015-11-04 DIAGNOSIS — Z510 Encounter for antineoplastic radiation therapy: Secondary | ICD-10-CM | POA: Diagnosis not present

## 2015-11-05 ENCOUNTER — Ambulatory Visit
Admission: RE | Admit: 2015-11-05 | Discharge: 2015-11-05 | Disposition: A | Discharge status: Home / Self Care | Payer: Managed Care, Other (non HMO) | Source: Ambulatory Visit | Attending: Radiation Oncology | Admitting: Radiation Oncology

## 2015-11-05 DIAGNOSIS — Z510 Encounter for antineoplastic radiation therapy: Secondary | ICD-10-CM | POA: Diagnosis not present

## 2015-11-08 ENCOUNTER — Ambulatory Visit
Admission: RE | Admit: 2015-11-08 | Discharge: 2015-11-08 | Disposition: A | Discharge status: Home / Self Care | Payer: Managed Care, Other (non HMO) | Source: Ambulatory Visit | Attending: Radiation Oncology | Admitting: Radiation Oncology

## 2015-11-08 DIAGNOSIS — Z510 Encounter for antineoplastic radiation therapy: Secondary | ICD-10-CM | POA: Diagnosis not present

## 2015-11-09 ENCOUNTER — Encounter: Payer: Self-pay | Admitting: Radiation Oncology

## 2015-11-09 ENCOUNTER — Ambulatory Visit
Admission: RE | Admit: 2015-11-09 | Discharge: 2015-11-09 | Disposition: A | Discharge status: Home / Self Care | Payer: Managed Care, Other (non HMO) | Source: Ambulatory Visit | Attending: Radiation Oncology | Admitting: Radiation Oncology

## 2015-11-09 VITALS — BP 118/64 | HR 61 | Temp 98.1°F | Ht 65.0 in | Wt 138.8 lb

## 2015-11-09 DIAGNOSIS — Z923 Personal history of irradiation: Secondary | ICD-10-CM | POA: Insufficient documentation

## 2015-11-09 DIAGNOSIS — C50411 Malignant neoplasm of upper-outer quadrant of right female breast: Secondary | ICD-10-CM

## 2015-11-09 DIAGNOSIS — C50911 Malignant neoplasm of unspecified site of right female breast: Secondary | ICD-10-CM | POA: Diagnosis present

## 2015-11-09 DIAGNOSIS — Z510 Encounter for antineoplastic radiation therapy: Secondary | ICD-10-CM | POA: Diagnosis not present

## 2015-11-09 MED ORDER — ALRA NON-METALLIC DEODORANT (RAD-ONC)
1.0000 "application " | Freq: Once | TOPICAL | Status: AC
  Administered 2015-11-09: 1 via TOPICAL

## 2015-11-09 MED ORDER — RADIAPLEXRX EX GEL
Freq: Once | CUTANEOUS | Status: AC
  Administered 2015-11-09: 17:00:00 via TOPICAL

## 2015-11-09 NOTE — Progress Notes (Signed)
Weekly Management Note Current Dose: 13.35  Gy  Projected Dose: 42.72 Gy   Narrative:  The patient presents for routine under treatment assessment.  CBCT/MVCT images/Port film x-rays were reviewed.  The chart was checked. No problems. RN education performed.   Physical Findings: Weight: 138 lb 12.8 oz (62.959 kg). Unchanged  Impression:  The patient is tolerating radiation.  Plan:  Continue treatment as planned. Continue radiaplex.

## 2015-11-09 NOTE — Progress Notes (Signed)
Tina Nielsen has received 5 fractions to her right breast.  Skin to right breast with normal color pink.  Radiaplex gel and Alra given today with instructions.  Appetite is okay.  Having fatigue after treatment, usally fine after dinner.  Denies pain this afternoon. Pt here for patient teaching.  Pt given Radiation Therapy and You.. Pt reports they have  seen the Radiation Therapy Education video on 11-09-15.  Reviewed areas of pertinence such as fatigue, hair loss, skin changes, breast tenderness and breast swelling . Pt able to give teach back of to pat skin, use unscented/gentle soap, have Imodium on hand and drink plenty of water,apply Radiaplex bid, avoid applying anything to skin within 4 hours of treatment, avoid wearing an under wire bra and to use an electric razor if they must shave. Pt demonstrated understanding of information given and will contact nursing with any questions or concerns.   BP 118/64 mmHg  Pulse 61  Temp(Src) 98.1 F (36.7 C)  Ht 5\' 5"  (1.651 m)  Wt 138 lb 12.8 oz (62.959 kg)  BMI 23.10 kg/m2  SpO2 100%  Http://rtanswers.org/treatmentinformation/whattoexpect/index

## 2015-11-10 ENCOUNTER — Ambulatory Visit
Admission: RE | Admit: 2015-11-10 | Discharge: 2015-11-10 | Disposition: A | Discharge status: Home / Self Care | Payer: Managed Care, Other (non HMO) | Source: Ambulatory Visit | Attending: Radiation Oncology | Admitting: Radiation Oncology

## 2015-11-10 DIAGNOSIS — Z510 Encounter for antineoplastic radiation therapy: Secondary | ICD-10-CM | POA: Diagnosis not present

## 2015-11-11 ENCOUNTER — Telehealth: Payer: Self-pay | Admitting: *Deleted

## 2015-11-11 ENCOUNTER — Ambulatory Visit
Admission: RE | Admit: 2015-11-11 | Discharge: 2015-11-11 | Disposition: A | Discharge status: Home / Self Care | Payer: Managed Care, Other (non HMO) | Source: Ambulatory Visit | Attending: Radiation Oncology | Admitting: Radiation Oncology

## 2015-11-11 DIAGNOSIS — Z510 Encounter for antineoplastic radiation therapy: Secondary | ICD-10-CM | POA: Diagnosis not present

## 2015-11-11 NOTE — Telephone Encounter (Signed)
Left message for a return phone call to follow up after start of radiation.   

## 2015-11-12 ENCOUNTER — Ambulatory Visit
Admission: RE | Admit: 2015-11-12 | Discharge: 2015-11-12 | Disposition: A | Discharge status: Home / Self Care | Payer: Managed Care, Other (non HMO) | Source: Ambulatory Visit | Attending: Radiation Oncology | Admitting: Radiation Oncology

## 2015-11-12 DIAGNOSIS — Z510 Encounter for antineoplastic radiation therapy: Secondary | ICD-10-CM | POA: Diagnosis not present

## 2015-11-15 ENCOUNTER — Ambulatory Visit
Admission: RE | Admit: 2015-11-15 | Discharge: 2015-11-15 | Disposition: A | Discharge status: Home / Self Care | Payer: Managed Care, Other (non HMO) | Source: Ambulatory Visit | Attending: Radiation Oncology | Admitting: Radiation Oncology

## 2015-11-15 DIAGNOSIS — Z510 Encounter for antineoplastic radiation therapy: Secondary | ICD-10-CM | POA: Diagnosis not present

## 2015-11-16 ENCOUNTER — Ambulatory Visit
Admission: RE | Admit: 2015-11-16 | Discharge: 2015-11-16 | Disposition: A | Discharge status: Home / Self Care | Payer: Managed Care, Other (non HMO) | Source: Ambulatory Visit | Attending: Radiation Oncology | Admitting: Radiation Oncology

## 2015-11-16 ENCOUNTER — Encounter: Payer: Self-pay | Admitting: Radiation Oncology

## 2015-11-16 DIAGNOSIS — Z510 Encounter for antineoplastic radiation therapy: Secondary | ICD-10-CM | POA: Diagnosis not present

## 2015-11-16 NOTE — Progress Notes (Signed)
Tina Nielsen has received 10 fractions to her right breast.  Skin to right breast with slight tanning.  Using Radiaplex gel as instructed.  Appetite is fine.  Fatigue is getting better. BP 114/64 mmHg  Pulse 72  Temp(Src) 98.1 F (36.7 C)  Resp 16  Ht 5\' 5"  (1.651 m)  Wt 139 lb 3.2 oz (63.141 kg)  BMI 23.16 kg/m2  SpO2 100%

## 2015-11-17 ENCOUNTER — Ambulatory Visit
Admission: RE | Admit: 2015-11-17 | Discharge: 2015-11-17 | Disposition: A | Discharge status: Home / Self Care | Payer: Managed Care, Other (non HMO) | Source: Ambulatory Visit | Attending: Radiation Oncology | Admitting: Radiation Oncology

## 2015-11-17 DIAGNOSIS — Z510 Encounter for antineoplastic radiation therapy: Secondary | ICD-10-CM | POA: Diagnosis not present

## 2015-11-18 ENCOUNTER — Ambulatory Visit
Admission: RE | Admit: 2015-11-18 | Discharge: 2015-11-18 | Disposition: A | Discharge status: Home / Self Care | Payer: Managed Care, Other (non HMO) | Source: Ambulatory Visit | Attending: Radiation Oncology | Admitting: Radiation Oncology

## 2015-11-18 ENCOUNTER — Telehealth: Payer: Self-pay | Admitting: Oncology

## 2015-11-18 ENCOUNTER — Ambulatory Visit (HOSPITAL_BASED_OUTPATIENT_CLINIC_OR_DEPARTMENT_OTHER): Payer: Managed Care, Other (non HMO) | Admitting: Oncology

## 2015-11-18 VITALS — BP 137/67 | HR 65 | Temp 98.0°F | Resp 18 | Ht 65.0 in | Wt 137.1 lb

## 2015-11-18 DIAGNOSIS — C50411 Malignant neoplasm of upper-outer quadrant of right female breast: Secondary | ICD-10-CM | POA: Diagnosis not present

## 2015-11-18 DIAGNOSIS — D0511 Intraductal carcinoma in situ of right breast: Secondary | ICD-10-CM

## 2015-11-18 DIAGNOSIS — Z510 Encounter for antineoplastic radiation therapy: Secondary | ICD-10-CM | POA: Diagnosis not present

## 2015-11-18 MED ORDER — TAMOXIFEN CITRATE 20 MG PO TABS: 20.0000 mg | ORAL_TABLET | Freq: Every day | ORAL | Status: DC

## 2015-11-18 NOTE — Progress Notes (Signed)
[facility]  [phone] [phone]) [phone]     ID: Tina Nielsen DOB: 1951/01/14  MR#: [id number]  ZDG#:[id number]  Patient Care Team: Maurice Small, MD as PCP - General (Family Medicine) Chauncey Cruel, MD as Consulting Physician (Oncology) Rolm Bookbinder, MD as Consulting Physician (General Surgery) Thea Silversmith, MD as Consulting Physician (Radiation Oncology) PCP: Jonathon Bellows, MD GYN: OTHER MD:  CHIEF COMPLAINT: Estrogen receptor positive breast cancer  CURRENT TREATMENT: Awaiting definitive surgery   BREAST CANCER HISTORY: "Tina Nielsen" noted a possible lump in her left breast and was referred for diagnostic mammography with tomosynthesis at [facility] 08/17/2015. The breast composition was category D. There was no finding in the left breast to correlate with the patient's lower inner quadrant concern. Ultrasound was obtained the same day and was likewise unremarkable.   However in the right breast upper outer quadrant posteriorly there was a 4 mm area of grouped calcifications. This was felt to warrant biopsy, which was performed stereotactically 09/01/2015. The final pathology (SAA [id number]) showed an invasive ductal carcinoma, grade 1, estrogen receptor 100% positive, progesterone receptor 90% positive, both with strong staining intensity, with an MIB-1 of 10%, and no HER-2 amplification, the signals ratio being 1.17 and the number per cell 2.35.  Bilateral breast MRI was obtained 09/06/2015. This describes the breast density as category C. In the upper outer quadrant of the right breast there was a clip. There was no suspicious surrounding enhancement. More laterally there was a 5.1 cm fluid collection compatible with a hematoma. The left breast was normal and there was no abnormal appearing adenopathy.   Her subsequent history is as detailed below.  INTERVAL HISTORY: Tina Nielsen returns today for follow-up of her estrogen receptor positive breast cancer.  Since her last visit here she underwent right lumpectomy and sentinel lymph node sampling.This showed ductal carcinoma in situ, intermediate grade, with negative and ample margins. All 3 sentinel lymph nodes were clear. She then started adjuvant radiation which she will complete next week.  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  REVIEW OF SYSTEMS: Tina Nielsen is tolerating the radiation generally well. Her skin is itchy but not peeling. She is fatigued but still working full-time. A detailed review of systems today was otherwise noncontributory   PAST MEDICAL HISTORY: Past Medical History  Diagnosis Date  . Osteopenia   . Atrophic vaginitis   . High cholesterol   . Arthritis     knees  . Breast cancer of upper-outer quadrant of right female breast ([place]) 09/03/2015  . Anxiety   . Family history of breast cancer   . Breast cancer ([place])     PAST SURGICAL HISTORY: Past Surgical History  Procedure Laterality Date  . Breast surgery      BIOPSY  . Tubal ligation    . Knee surgery    . Breast lumpectomy with needle localization and axillary sentinel lymph node bx Right 09/30/2015    Procedure: RIGHT BREAST LUMPECTOMY WITH NEEDLE LOCALIZATION AND AXILLARY SENTINEL LYMPH NODE BX;  Surgeon: Rolm Bookbinder, MD;  Location: [facility];  Service: General;  Laterality: Right;    FAMILY HISTORY Family History  Problem Relation Age of Onset  . Hypertension Mother   . Breast cancer Mother 66    second cancer at 3  . Heart disease Mother   . Aneurysm Father     AORTIC  . Heart disease Maternal Grandfather   .  Colon cancer Neg Hx   . Kidney cancer Maternal Aunt 26  . Prostate cancer Paternal Uncle 9  . Stroke Maternal Grandmother   . Breast cancer Other     MGMs sister  . Breast cancer Other     PGMs sister  . Breast cancer Other     PGF sister   the patient's father died from a ruptured aortic  aneurysm the age of 86. The patient's mother was diagnosed with breast cancer at age 66. She had a second breast cancer diagnosed at age 91. She is currently 66 years old. The patient had one brother, no sisters. As far as she is aware that there are no other breast or ovarian cancer instances in the family  GYNECOLOGIC HISTORY:  No LMP recorded. Patient is postmenopausal. Menarche age 41, first live birth age 77, which the patient knows increases the risk of breast cancer. She is GX P2. She stopped having periods around age 60. She did not take hormone replacement. 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 Her husband [name].history as well as coached basketball at the same school. He is now retired but is very active in the [employer]. He knows Tina Nielsen well and has quite a few stories to tell regarding [facility] basketball. Their son Tina Nielsen lives in [place] and works in Designer, television/film set as a Environmental manager. Son Tina Nielsen lives in [place] working as a Designer, jewellery. The patient has no grandchildren.    ADVANCED DIRECTIVES: In place   HEALTH MAINTENANCE: Social History  Substance Use Topics  . Smoking status: Former Smoker    Quit date: 08/07/1976  . Smokeless tobacco: Never Used  . Alcohol Use: 4.2 oz/week    7 Glasses of wine per week     Colonoscopy: 2015/[facility]  PAP:  Bone density: October 2015, osteopenia  Lipid panel:  Allergies  Allergen Reactions  . Aleve [Naproxen] Other (See Comments)    "stomach irritation"  . Celebrex [Celecoxib]     Irritated stomach- caused burning  . Ibuprofen     "UPSET STOMACH"- burning    Current Outpatient Prescriptions  Medication Sig Dispense Refill  . acetaminophen (TYLENOL) 500 MG tablet Take 500 mg by mouth every 6 (six) hours as needed.    . Calcium Carbonate-Vitamin D (CALCIUM + D PO) Take by  mouth.      . escitalopram (LEXAPRO) 10 MG tablet take 1 tablet by mouth once daily 30 tablet 12  . Glucosamine-Chondroitin (GLUCOSAMINE CHONDR COMPLEX PO) Take by mouth.      . hyaluronate sodium (RADIAPLEXRX) GEL Apply 1 application topically 2 (two) times daily.    . Multiple Vitamin (MULTIVITAMIN) capsule Take 1 capsule by mouth daily.      . non-metallic deodorant Tina Nielsen) MISC Apply 1 application topically daily.     . pravastatin (PRAVACHOL) 40 MG tablet   0  . VITAMIN E PO Take by mouth.       No current facility-administered medications for this visit.    OBJECTIVE: Middle-aged white woman Who appears stated age 79 Vitals:   11/18/15 1408  BP: 137/67  Pulse: 65  Temp: 98 F (36.7 C)  Resp: 18     Body mass index is 22.81 kg/(m^2).    ECOG FS:1 - Symptomatic but completely ambulatory  Sclerae unicteric, pupils round and equal Oropharynx clear and moist-- no thrush or other lesions  No cervical or supraclavicular adenopathy Lungs no rales or rhonchi Heart regular rate and rhythm Abd soft, nontender, positive bowel sounds MSK no focal spinal tenderness, no upper extremity lymphedema Neuro: nonfocal, well oriented, appropriate affect Breasts: The right breast is status post lumpectomy and is currently undergoing radiation. There is no dehiscence, or swelling. Minimal erythema. There is no evidence of local recurrence. The right axilla is benign. The left breast is unremarkable.  LAB RESULTS:  CMP     Component Value Date/Time   NA 144 09/27/2015 0850   NA 141 09/16/2015 1612   K 3.3* 09/27/2015 0850   K 3.8 09/16/2015 1612   CL 106 09/27/2015 0850   CO2 29 09/27/2015 0850   CO2 29 09/16/2015 1612   GLUCOSE 85 09/27/2015 0850   GLUCOSE 103 09/16/2015 1612   BUN 9 09/27/2015 0850   BUN 16.1 09/16/2015 1612   CREATININE 0.85 09/27/2015 0850   CREATININE 0.9 09/16/2015 1612   CALCIUM 9.5 09/27/2015 0850   CALCIUM 9.1 09/16/2015 1612   PROT 6.9 09/16/2015 1612    ALBUMIN 3.8 09/16/2015 1612   AST 22 09/16/2015 1612   ALT 14 09/16/2015 1612   ALKPHOS 65 09/16/2015 1612   BILITOT 0.77 09/16/2015 1612   GFRNONAA >60 09/27/2015 0850   GFRAA >60 09/27/2015 0850    INo results found for: SPEP, UPEP  Lab Results  Component Value Date   WBC 7.8 09/27/2015   NEUTROABS 5.9 09/27/2015   HGB 12.3 09/27/2015   HCT 38.2 09/27/2015   MCV 91.8 09/27/2015   PLT 205 09/27/2015      Chemistry      Component Value Date/Time   NA 144 09/27/2015 0850   NA 141 09/16/2015 1612   K 3.3* 09/27/2015 0850   K 3.8 09/16/2015 1612   CL 106 09/27/2015 0850   CO2 29 09/27/2015 0850   CO2 29 09/16/2015 1612   BUN 9 09/27/2015 0850   BUN 16.1 09/16/2015 1612   CREATININE 0.85 09/27/2015 0850   CREATININE 0.9 09/16/2015 1612      Component Value Date/Time   CALCIUM 9.5 09/27/2015 0850   CALCIUM 9.1 09/16/2015 1612   ALKPHOS 65 09/16/2015 1612   AST 22 09/16/2015 1612   ALT 14 09/16/2015 1612   BILITOT 0.77 09/16/2015 1612       No results found for: LABCA2  No components found for: LABCA125  No results for input(s): INR in the last 168 hours.  Urinalysis    Component Value Date/Time   COLORURINE YELLOW 02/10/2014 0846   APPEARANCEUR TURBID* 02/10/2014 0846   LABSPEC 1.022 02/10/2014 0846   PHURINE 5.5 02/10/2014 0846   GLUCOSEU NEG 02/10/2014 0846   HGBUR NEG 02/10/2014 0846   BILIRUBINUR NEG 02/10/2014 0846   KETONESUR NEG 02/10/2014 0846   PROTEINUR NEG 02/10/2014 0846   UROBILINOGEN 0.2 02/10/2014 0846   NITRITE NEG 02/10/2014 0846   LEUKOCYTESUR NEG 02/10/2014 0846      ELIGIBLE FOR AVAILABLE RESEARCH PROTOCOL: no  STUDIES: No results found.  ASSESSMENT: 65 y.o. [place] woman status post right breast upper outer quadrant biopsy 09/01/2015 for a clinical T1a N0, stage IA invasive ductal carcinoma, grade 1, estrogen and progesterone receptor positive, HER-2 not amplified, with an MIB-1 of 10%  (1) genetics testing 10/19/2015  through the Breast/Ovarian gene panel offered by [facility] found no deleterious mutations in ATM, BARD1, BRCA1, BRCA2, BRIP1, CDH1, EPCAM, FANCC, MLH1, MSH2, MSH6, NBN, PALB2, PMS2, PTEN, RAD51C, RAD51D, TP53, and XRCC2.  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   (b) Genetic testing also detected a Variant of Unknown Significance in the BRCA2 gene called [id number].  (2) status post right lumpectomy with sentinel lymph node sampling 09/30/2015 for a pTis pN0, stage 0 ductal carcinoma in situ, grade 2, with negative margins  (3) adjuvant radiation in progress  (4) to start tamoxifen 01/06/2016  PLAN: Today we discussed the implications of Tina Nielsen's CHEK2 mutation. Given her family history of breast cancer, this significantly increases her risk of her developing another breast cancer in the future. We discussed bilateral mastectomies prophylactically versus a strategy of intensified screening with yearly mammography/tomography and yearly breast MRI.  She very much prefers to screening strategy and that is what we will implement.  We discussed the fact that the variant in the BRCA2 gene that was found marrow may not have any significance. There is no history of ovarian cancer in her family however we cannot screening for ovarian cancer. In addition bilateral salpingo-oophorectomy is a relatively straightforward procedure in a postmenopausal women. After much discussion she decided she is comfortable keeping her ovaries and that is a reasonable decision.  We then discussed anti-estrogens. These will decrease the already low risk of local recurrence but more importantly they will cut in almost half her risk of developing another breast cancer in the future. Given the possible toxicities, side effects and complications of tamoxifen as compared to anastrozole, she prefers to start tamoxifen.  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 This is totally reasonable.  Accordingly she will return to see me in September. She knows to call for any problems that may develop before then. At this point the plan is to continue tamoxifen for a total of 5 years.  Chauncey Cruel, MD   11/18/2015 2:22 PM Medical Oncology and Hematology [facility] [address] [place], [place] [address] Tel. [phone]    Fax. [phone]

## 2015-11-18 NOTE — Telephone Encounter (Signed)
lvm to inform patient of Sept appt date/time per GM 4/13 pof

## 2015-11-19 ENCOUNTER — Ambulatory Visit
Admission: RE | Admit: 2015-11-19 | Discharge: 2015-11-19 | Disposition: A | Discharge status: Home / Self Care | Payer: Managed Care, Other (non HMO) | Source: Ambulatory Visit | Attending: Radiation Oncology | Admitting: Radiation Oncology

## 2015-11-19 DIAGNOSIS — Z510 Encounter for antineoplastic radiation therapy: Secondary | ICD-10-CM | POA: Diagnosis not present

## 2015-11-22 ENCOUNTER — Ambulatory Visit
Admission: RE | Admit: 2015-11-22 | Discharge: 2015-11-22 | Disposition: A | Discharge status: Home / Self Care | Payer: Managed Care, Other (non HMO) | Source: Ambulatory Visit | Attending: Radiation Oncology | Admitting: Radiation Oncology

## 2015-11-22 DIAGNOSIS — Z510 Encounter for antineoplastic radiation therapy: Secondary | ICD-10-CM | POA: Diagnosis not present

## 2015-11-23 ENCOUNTER — Encounter: Payer: Self-pay | Admitting: Radiation Oncology

## 2015-11-23 ENCOUNTER — Ambulatory Visit
Admission: RE | Admit: 2015-11-23 | Discharge: 2015-11-23 | Disposition: A | Discharge status: Home / Self Care | Payer: Managed Care, Other (non HMO) | Source: Ambulatory Visit | Attending: Radiation Oncology | Admitting: Radiation Oncology

## 2015-11-23 VITALS — BP 124/58 | HR 59 | Temp 97.9°F | Ht 65.0 in | Wt 140.2 lb

## 2015-11-23 DIAGNOSIS — C50911 Malignant neoplasm of unspecified site of right female breast: Secondary | ICD-10-CM | POA: Diagnosis present

## 2015-11-23 DIAGNOSIS — D0511 Intraductal carcinoma in situ of right breast: Secondary | ICD-10-CM

## 2015-11-23 DIAGNOSIS — Z923 Personal history of irradiation: Secondary | ICD-10-CM | POA: Diagnosis not present

## 2015-11-23 DIAGNOSIS — Z510 Encounter for antineoplastic radiation therapy: Secondary | ICD-10-CM | POA: Diagnosis not present

## 2015-11-23 MED ORDER — SONAFINE EX EMUL
1.0000 "application " | Freq: Two times a day (BID) | CUTANEOUS | Status: DC
  Administered 2015-11-23: 1 via TOPICAL

## 2015-11-23 NOTE — Progress Notes (Signed)
Tina Nielsen has received 15 fractions to her right breast.  She reports rash with itching in the upper inner portion of her right breast.  Note rash.  Given Sonafine.  Skin intact.  She reports that her energy level is "a littite lower".

## 2015-11-23 NOTE — Progress Notes (Signed)
Weekly Management Note Current Dose: 40.5 Gy  Projected Dose: 42.72 Gy   Narrative:  The patient presents for routine under treatment assessment.  CBCT/MVCT images/Port film x-rays were reviewed.  The chart was checked.   Physical Findings: Weight: 140 lb 3.2 oz (63.594 kg). Unchanged  Impression:  The patient is tolerating radiation.  Plan:  Continue treatment as planned. Continue radiaplex. Discussed postRT skin care. Follow up in 1 month.

## 2015-11-24 ENCOUNTER — Encounter: Payer: Self-pay | Admitting: Radiation Oncology

## 2015-11-24 ENCOUNTER — Ambulatory Visit
Admission: RE | Admit: 2015-11-24 | Discharge: 2015-11-24 | Disposition: A | Discharge status: Home / Self Care | Payer: Managed Care, Other (non HMO) | Source: Ambulatory Visit | Attending: Radiation Oncology | Admitting: Radiation Oncology

## 2015-11-24 DIAGNOSIS — Z510 Encounter for antineoplastic radiation therapy: Secondary | ICD-10-CM | POA: Diagnosis not present

## 2015-11-25 ENCOUNTER — Telehealth: Payer: Self-pay | Admitting: *Deleted

## 2015-11-25 NOTE — Telephone Encounter (Signed)
Left message for a return phone call to follow up after radiation complete.

## 2015-12-03 ENCOUNTER — Telehealth: Payer: Self-pay | Admitting: *Deleted

## 2015-12-03 ENCOUNTER — Other Ambulatory Visit: Payer: Self-pay | Admitting: Adult Health

## 2015-12-03 ENCOUNTER — Telehealth: Payer: Self-pay | Admitting: Nurse Practitioner

## 2015-12-03 DIAGNOSIS — C50411 Malignant neoplasm of upper-outer quadrant of right female breast: Secondary | ICD-10-CM

## 2015-12-03 NOTE — Telephone Encounter (Signed)
Called patient to inform of fu appt. On 12-30-15, lvm for a return call

## 2015-12-03 NOTE — Telephone Encounter (Signed)
lvm for pt regarding to June appt... °

## 2015-12-07 ENCOUNTER — Encounter: Payer: Self-pay | Admitting: Women's Health

## 2015-12-07 ENCOUNTER — Ambulatory Visit (INDEPENDENT_AMBULATORY_CARE_PROVIDER_SITE_OTHER): Payer: Managed Care, Other (non HMO) | Admitting: Women's Health

## 2015-12-07 VITALS — BP 118/78 | Ht 65.0 in | Wt 140.0 lb

## 2015-12-07 DIAGNOSIS — Z8739 Personal history of other diseases of the musculoskeletal system and connective tissue: Secondary | ICD-10-CM | POA: Diagnosis not present

## 2015-12-07 DIAGNOSIS — Z01419 Encounter for gynecological examination (general) (routine) without abnormal findings: Secondary | ICD-10-CM

## 2015-12-07 DIAGNOSIS — Z13820 Encounter for screening for osteoporosis: Secondary | ICD-10-CM | POA: Diagnosis not present

## 2015-12-07 NOTE — Patient Instructions (Signed)
Basic Carbohydrate Counting for Diabetes Mellitus Carbohydrate counting is a method for keeping track of the amount of carbohydrates you eat. Eating carbohydrates naturally increases the level of sugar (glucose) in your blood, so it is important for you to know the amount that is okay for you to have in every meal. Carbohydrate counting helps keep the level of glucose in your blood within normal limits. The amount of carbohydrates allowed is different for every person. A dietitian can help you calculate the amount that is right for you. Once you know the amount of carbohydrates you can have, you can count the carbohydrates in the foods you want to eat. Carbohydrates are found in the following foods:  Grains, such as breads and cereals.  Dried beans and soy products.  Starchy vegetables, such as potatoes, peas, and corn.  Fruit and fruit juices.  Milk and yogurt.  Sweets and snack foods, such as cake, cookies, candy, chips, soft drinks, and fruit drinks. CARBOHYDRATE COUNTING There are two ways to count the carbohydrates in your food. You can use either of the methods or a combination of both. Reading the "Nutrition Facts" on [place] The "Nutrition Facts" is an area that is included on the labels of almost all packaged food and beverages in the [place]. It includes the serving size of that food or beverage and information about the nutrients in each serving of the food, including the grams (g) of carbohydrate per serving.  Decide the number of servings of this food or beverage that you will be able to eat or drink. Multiply that number of servings by the number of grams of carbohydrate that is listed on the label for that serving. The total will be the amount of carbohydrates you will be having when you eat or drink this food or beverage. Learning Standard Serving Sizes of Food When you eat food that is not packaged or does not include "Nutrition Facts" on the label, you need to  measure the servings in order to count the amount of carbohydrates.A serving of most carbohydrate-rich foods contains about 15 g of carbohydrates. The following list includes serving sizes of carbohydrate-rich foods that provide 15 g ofcarbohydrate per serving:   1 slice of bread (1 oz) or 1 six-inch tortilla.    of a hamburger bun or English muffin.  4-6 crackers.   cup unsweetened dry cereal.    cup hot cereal.   cup rice or pasta.    cup mashed potatoes or  of a large baked potato.  1 cup fresh fruit or one small piece of fruit.    cup canned or frozen fruit or fruit juice.  1 cup milk.   cup plain fat-free yogurt or yogurt sweetened with artificial sweeteners.   cup cooked dried beans or starchy vegetable, such as peas, corn, or potatoes.  Decide the number of standard-size servings that you will eat. Multiply that number of servings by 15 (the grams of carbohydrates in that serving). For example, if you eat 2 cups of strawberries, you will have eaten 2 servings and 30 g of carbohydrates (2 servings x 15 g = 30 g). For foods such as soups and casseroles, in which more than one food is mixed in, you will need to count the carbohydrates in each food that is included. EXAMPLE OF CARBOHYDRATE COUNTING Sample Dinner  3 oz chicken breast.   cup of brown rice.   cup of corn.  1 cup milk.   1 cup strawberries with  sugar-free whipped topping.  Carbohydrate Calculation Step 1: Identify the foods that contain carbohydrates:   Rice.   Corn.   Milk.   Strawberries. Step 2:Calculate the number of servings eaten of each:   2 servings of rice.   1 serving of corn.   1 serving of milk.   1 serving of strawberries. Step 3: Multiply each of those number of servings by 15 g: Menopause is a normal process in which your reproductive ability comes to an end. This process happens gradually over a span of months to years, usually between the ages of 47  and 51. Menopause is complete when you have missed 12 consecutive menstrual periods. It is important to talk with your health care provider about some of the most common conditions that affect postmenopausal women, such as heart disease, cancer, and bone loss (osteoporosis). Adopting a healthy lifestyle and getting preventive care can help to promote your health and wellness. Those actions can also lower your chances of developing some of these common conditions. WHAT SHOULD I KNOW ABOUT MENOPAUSE? During menopause, you may experience a number of symptoms, such as:  Moderate-to-severe hot flashes.  Night sweats.  Decrease in sex drive.  Mood swings.  Headaches.  Tiredness.  Irritability.  Memory problems.  Insomnia. Choosing to treat or not to treat menopausal changes is an individual decision that you make with your health care provider. WHAT SHOULD I KNOW ABOUT HORMONE REPLACEMENT THERAPY AND SUPPLEMENTS? Hormone therapy products are effective for treating symptoms that are associated with menopause, such as hot flashes and night sweats. Hormone replacement carries certain risks, especially as you become older. If you are thinking about using estrogen or estrogen with progestin treatments, discuss the benefits and risks with your health care provider. WHAT SHOULD I KNOW ABOUT HEART DISEASE AND STROKE? Heart disease, heart attack, and stroke become more likely as you age. This may be due, in part, to the hormonal changes that your body experiences during menopause. These can affect how your body processes dietary fats, triglycerides, and cholesterol. Heart attack and stroke are both medical emergencies. There are many things that you can do to help prevent heart disease and stroke:  Have your blood pressure checked at least every 1-2 years. High blood pressure causes heart disease and increases the risk of stroke.  If you are 17-42 years old, ask your health care provider if you  should take aspirin to prevent a heart attack or a stroke.  Do not use any tobacco products, including cigarettes, chewing tobacco, or electronic cigarettes. If you need help quitting, ask your health care provider.  It is important to eat a healthy diet and maintain a healthy weight.  Be sure to include plenty of vegetables, fruits, low-fat dairy products, and lean protein.  Avoid eating foods that are high in solid fats, added sugars, or salt (sodium).  Get regular exercise. This is one of the most important things that you can do for your health.  Try to exercise for at least 150 minutes each week. The type of exercise that you do should increase your heart rate and make you sweat. This is known as moderate-intensity exercise.  Try to do strengthening exercises at least twice each week. Do these in addition to the moderate-intensity exercise.  Know your numbers.Ask your health care provider to check your cholesterol and your blood glucose. Continue to have your blood tested as directed by your health care provider. WHAT SHOULD I KNOW ABOUT CANCER SCREENING? There are  several types of cancer. Take the following steps to reduce your risk and to catch any cancer development as early as possible. Breast Cancer  Practice breast self-awareness.  This means understanding how your breasts normally appear and feel.  It also means doing regular breast self-exams. Let your health care provider know about any changes, no matter how small.  If you are 72 or older, have a clinician do a breast exam (clinical breast exam or CBE) every year. Depending on your age, family history, and medical history, it may be recommended that you also have a yearly breast X-ray (mammogram).  If you have a family history of breast cancer, talk with your health care provider about genetic screening.  If you are at high risk for breast cancer, talk with your health care provider about having an MRI and a mammogram  every year.  Breast cancer (BRCA) gene test is recommended for women who have family members with BRCA-related cancers. Results of the assessment will determine the need for genetic counseling and BRCA1 and for BRCA2 testing. BRCA-related cancers include these types:  Breast. This occurs in males or females.  Ovarian.  Tubal. This may also be called fallopian tube cancer.  Cancer of the abdominal or pelvic lining (peritoneal cancer).  Prostate.  Pancreatic. Cervical, Uterine, and Ovarian Cancer Your health care provider may recommend that you be screened regularly for cancer of the pelvic organs. These include your ovaries, uterus, and vagina. This screening involves a pelvic exam, which includes checking for microscopic changes to the surface of your cervix (Pap test).  For women ages 21-65, health care providers may recommend a pelvic exam and a Pap test every three years. For women ages 13-65, they may recommend the Pap test and pelvic exam, combined with testing for human papilloma virus (HPV), every five years. Some types of HPV increase your risk of cervical cancer. Testing for HPV may also be done on women of any age who have unclear Pap test results.  Other health care providers may not recommend any screening for nonpregnant women who are considered low risk for pelvic cancer and have no symptoms. Ask your health care provider if a screening pelvic exam is right for you.  If you have had past treatment for cervical cancer or a condition that could lead to cancer, you need Pap tests and screening for cancer for at least 20 years after your treatment. If Pap tests have been discontinued for you, your risk factors (such as having a new sexual partner) need to be reassessed to determine if you should start having screenings again. Some women have medical problems that increase the chance of getting cervical cancer. In these cases, your health care provider may recommend that you have  screening and Pap tests more often.  If you have a family history of uterine cancer or ovarian cancer, talk with your health care provider about genetic screening.  If you have vaginal bleeding after reaching menopause, tell your health care provider.  There are currently no reliable tests available to screen for ovarian cancer. Lung Cancer Lung cancer screening is recommended for adults 2-17 years old who are at high risk for lung cancer because of a history of smoking. A yearly low-dose CT scan of the lungs is recommended if you:  Currently smoke.  Have a history of at least 30 pack-years of smoking and you currently smoke or have quit within the past 15 years. A pack-year is smoking an average of one pack  of cigarettes per day for one year. Yearly screening should:  Continue until it has been 15 years since you quit.  Stop if you develop a health problem that would prevent you from having lung cancer treatment. Colorectal Cancer  This type of cancer can be detected and can often be prevented.  Routine colorectal cancer screening usually begins at age 17 and continues through age 41.  If you have risk factors for colon cancer, your health care provider may recommend that you be screened at an earlier age.  If you have a family history of colorectal cancer, talk with your health care provider about genetic screening.  Your health care provider may also recommend using home test kits to check for hidden blood in your stool.  A small camera at the end of a tube can be used to examine your colon directly (sigmoidoscopy or colonoscopy). This is done to check for the earliest forms of colorectal cancer.  Direct examination of the colon should be repeated every 5-10 years until age 57. However, if early forms of precancerous polyps or small growths are found or if you have a family history or genetic risk for colorectal cancer, you may need to be screened more often. Skin Cancer  Check  your skin from head to toe regularly.  Monitor any moles. Be sure to tell your health care provider:  About any new moles or changes in moles, especially if there is a change in a mole's shape or color.  If you have a mole that is larger than the size of a pencil eraser.  If any of your family members has a history of skin cancer, especially at a Stellar Gensel age, talk with your health care provider about genetic screening.  Always use sunscreen. Apply sunscreen liberally and repeatedly throughout the day.  Whenever you are outside, protect yourself by wearing long sleeves, pants, a wide-brimmed hat, and sunglasses. WHAT SHOULD I KNOW ABOUT OSTEOPOROSIS? Osteoporosis is a condition in which bone destruction happens more quickly than new bone creation. After menopause, you may be at an increased risk for osteoporosis. To help prevent osteoporosis or the bone fractures that can happen because of osteoporosis, the following is recommended:  If you are 87-31 years old, get at least 1,000 mg of calcium and at least 600 mg of vitamin D per day.  If you are older than age 74 but younger than age 66, get at least 1,200 mg of calcium and at least 600 mg of vitamin D per day.  If you are older than age 70, get at least 1,200 mg of calcium and at least 800 mg of vitamin D per day. Smoking and excessive alcohol intake increase the risk of osteoporosis. Eat foods that are rich in calcium and vitamin D, and do weight-bearing exercises several times each week as directed by your health care provider. WHAT SHOULD I KNOW ABOUT HOW MENOPAUSE AFFECTS [place]? Depression may occur at any age, but it is more common as you become older. Common symptoms of depression include:  Low or sad mood.  Changes in sleep patterns.  Changes in appetite or eating patterns.  Feeling an overall lack of motivation or enjoyment of activities that you previously enjoyed.  Frequent crying spells. Talk with your health  care provider if you think that you are experiencing depression. WHAT SHOULD I KNOW ABOUT IMMUNIZATIONS? It is important that you get and maintain your immunizations. These include:  Tetanus, diphtheria, and pertussis (Tdap) booster vaccine.  Influenza every year before the flu season begins.  Pneumonia vaccine.  Shingles vaccine. Your health care provider may also recommend other immunizations.   This information is not intended to replace advice given to you by your health care provider. Make sure you discuss any questions you have with your health care provider.   Document Released: 09/15/2005 Document Revised: 08/14/2014 Document Reviewed: 03/26/2014 Elsevier Interactive Patient Education 2016 Elsevier Inc.   2 servings of rice x 15 g = 30 g.   1 serving of corn x 15 g = 15 g.   1 serving of milk x 15 g = 15 g.   1 serving of strawberries x 15 g = 15 g. Step 4: Add together all of the amounts to find the total grams of carbohydrates eaten: 30 g + 15 g + 15 g + 15 g = 75 g.   This information is not intended to replace advice given to you by your health care provider. Make sure you discuss any questions you have with your health care provider.   Document Released: 07/24/2005 Document Revised: 08/14/2014 Document Reviewed: 06/20/2013 Elsevier Interactive Patient Education [employer].

## 2015-12-07 NOTE — Progress Notes (Incomplete)
°  Radiation Oncology         [phone] ________________________________  Name: Tina Nielsen MRN: [id number]  Date: 11/24/2015  DOB: [birth year]  End of Treatment Note  Diagnosis:   Breast cancer of upper-outer quadrant of right female breast [facility])   Staging form: Breast, AJCC 7th Edition     Clinical: Stage IA (T1a, N0, M0) - Signed by Chauncey Cruel, MD on 09/16/2015      Indication for treatment:  Curative       Radiation treatment dates:  11/03/2015 to 11/24/2015  Site/dose:  The Right breast was treated to 42.720 Gy in 16 fractions at 2.670 Gy per fraction.   Beams/energy:   3D-Conformal / 6X  Narrative: The patient tolerated radiation treatment relatively well.  She reported rash with itching in the upper inner portion of her right breast towards the end of treatment. She was given Sonafine. She also experienced decreased energy level with treatment.   Plan: The patient has completed radiation treatment. The patient will return to radiation oncology clinic for routine followup in one month. I advised them to call or return sooner if they have any questions or concerns related to their recovery or treatment.  ------------------------------------------------  Thea Silversmith, MD  This document serves as a record of services personally performed by Thea Silversmith, MD. It was created on her behalf by Arlyce Harman, a trained medical scribe. The creation of this record is based on the scribe's personal observations and the provider's statements to them. This document has been checked and approved by the attending provider.

## 2015-12-07 NOTE — Progress Notes (Addendum)
Temple A Gidley 07/11/1951 [id number]    History:    Presents for annual exam.  Postmenopausal on no HRT with no bleeding. Normal Pap history. 09/2015 right breast ductal carcinoma lymph nodes negative lumpectomy margins negative. HR, PR positive HER-2 negative completed radiation therapy planning to start tamoxifen this summer per Dr. Magrinat. Mother breast cancer age 66 had recurrences currently living at age 92. 2011 T score -1.8, 2014 normal DEXA. Primary care manages labs. 2014 negative colonoscopy.  Past medical history, past surgical history, family history and social history were all reviewed and documented in the EPIC chart. 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 2 children both live away.  ROS:  A ROS was performed and pertinent positives and negatives are included.  Exam:  Filed Vitals:   12/07/15 1531  BP: 118/78    General appearance:  Normal Thyroid:  Symmetrical, normal in size, without palpable masses or nodularity. Respiratory  Auscultation:  Clear without wheezing or rhonchi Cardiovascular  Auscultation:  Regular rate, without rubs, murmurs or gallops  Edema/varicosities:  Not grossly evident Abdominal  Soft,nontender, without masses, guarding or rebound.  Liver/spleen:  No organomegaly noted  Hernia:  None appreciated  Skin  Inspection:  Grossly normal   Breasts: Examined lying and sitting.     Right: Upper outer quadrant incision healing well/moderateerythema upper breast discharge or axillary adenopathy.     Left: Without masses, retractions, discharge or axillary adenopathy. Gentitourinary   Inguinal/mons:  Normal without inguinal adenopathy  External genitalia:  Normal  BUS/Urethra/Skene's glands:  Normal  Vagina:  Normal  Cervix:  Normal  Uterus:   normal in size, shape and contour.  Midline and mobile  Adnexa/parametria:     Rt: Without masses or tenderness.   Lt: Without masses or tenderness.  Anus and perineum: Normal  Digital rectal exam: Normal sphincter tone  without palpated masses or tenderness  Assessment/Plan:  65 y.o. MWF G2 P2 for annual exam with no complaints.  Postmenopausal/no HRT/no bleeding History of osteopenia 09/2015 right breast ductal carcinoma-Dr. Magrinat managing Hypercholesterolemia-primary care manages labs and meds  Plan: Schedule DEXA, instructed to have vitamin D level checked at next lab draw. Home safety, fall prevention and importance of weightbearing exercise reviewed, yoga encouraged. SBE's, continue mammograms as recommended, calcium rich diet, vitamin D 1000 daily encouraged. Pap with HR HPV typing, new screening guidelines reviewed if normal will l no longer need additional Paps.  YOUNG,NANCY J WHNP, 4:56 PM 12/07/2015  

## 2015-12-08 LAB — URINALYSIS W MICROSCOPIC + REFLEX CULTURE
BACTERIA UA: NONE SEEN [HPF]
Bilirubin Urine: NEGATIVE
CASTS: NONE SEEN [LPF]
CRYSTALS: NONE SEEN [HPF]
Glucose, UA: NEGATIVE
HGB URINE DIPSTICK: NEGATIVE
Ketones, ur: NEGATIVE
Leukocytes, UA: NEGATIVE
Nitrite: NEGATIVE
Protein, ur: NEGATIVE
RBC / HPF: NONE SEEN RBC/HPF (ref <=2)
SQUAMOUS EPITHELIAL / LPF: NONE SEEN [HPF] (ref <=5)
Specific Gravity, Urine: 1.012 (ref 1.001–1.035)
WBC, UA: NONE SEEN WBC/HPF (ref <=5)
YEAST: NONE SEEN [HPF]
pH: 5.5 (ref 5.0–8.0)

## 2015-12-27 NOTE — Progress Notes (Signed)
Mrs. Tina Nielsen  is here for a one month for follow up visit for breast cancer of upper-outer quadrant of right female breast  Skin status:Still has slight tanning Lotion being used: Stilll using Radiaplex gel explained that she should be using lotion with vitamin E Have you seen your medical oncologist? Date If not ,when is appointment [name] 04-18-16 Have you been scheduled for a mammogram ? : 09-30-15 ER+,have started AI or Tamoxifen? If not, why?   Tamoxifen startiting 01-06-16 Discuss survivorship appointment:02-03-16 [facility] Offer referral reading material for Survivorship, [employer] and [facility] given 12-29-15   Appetite:Good Pain:None Arm mobility: Fatigue:Having fatigue mostly in the afternoon. BP 111/52 mmHg  Pulse 65  Temp(Src) 97.6 F (36.4 C) (Oral)  Resp 16  Ht 5\' 5"  (1.651 m)  Wt 138 lb 12.8 oz (62.959 kg)  BMI 23.10 kg/m2  SpO2 100%

## 2015-12-30 ENCOUNTER — Encounter: Payer: Self-pay | Admitting: Radiation Oncology

## 2015-12-30 ENCOUNTER — Ambulatory Visit
Admission: RE | Admit: 2015-12-30 | Discharge: 2015-12-30 | Disposition: A | Discharge status: Home / Self Care | Payer: Managed Care, Other (non HMO) | Source: Ambulatory Visit | Attending: Radiation Oncology | Admitting: Radiation Oncology

## 2015-12-30 VITALS — BP 111/52 | HR 65 | Temp 97.6°F | Resp 16 | Ht 65.0 in | Wt 138.8 lb

## 2015-12-30 DIAGNOSIS — C50411 Malignant neoplasm of upper-outer quadrant of right female breast: Secondary | ICD-10-CM | POA: Insufficient documentation

## 2015-12-30 DIAGNOSIS — L818 Other specified disorders of pigmentation: Secondary | ICD-10-CM | POA: Insufficient documentation

## 2015-12-30 DIAGNOSIS — Z923 Personal history of irradiation: Secondary | ICD-10-CM | POA: Insufficient documentation

## 2015-12-30 NOTE — Progress Notes (Signed)
   Department of Radiation Oncology  Phone:  [phone] Fax:        [phone]   Name: [name] MRN: [id number]  DOB: 04/13/1951  Date: 12/30/2015  Follow Up Visit Note  Diagnosis: Breast cancer of upper-outer quadrant of right female breast [facility])   Staging form: Breast, AJCC 7th Edition     Clinical: Stage IA (T1a, N0, M0) - Signed by Chauncey Cruel, MD on 09/16/2015   Summary and Interval since last radiation: 1 month  Interval History: Tina Nielsen presents today for routine followup. She is still using Radiaplex gel. She reports a good appetite and denies any pain. She reports having fatigue mostly in the afternoon.   Physical Exam:  Filed Vitals:   12/30/15 0812  BP: 111/52  Pulse: 65  Temp: 97.6 F (36.4 C)  TempSrc: Oral  Resp: 16  Height: 5\' 5"  (1.651 m)  Weight: 138 lb 12.8 oz (62.959 kg)  SpO2: 100%     This is a well appearing female in no acute distress. Mild hyperpigmentation in the treatment field  IMPRESSION: Tina Nielsen is a 65 y.o. female with T1a N0, stage IA invasive ductal carcinoma.  PLAN:  She is doing well. We discussed the need for follow up every 4-6 months which she has scheduled.  We discussed the need for yearly mammograms which she can schedule with her OBGYN or with medical oncology. We discussed the need for sun protection in the treated area. We discussed switching to lotion with Vitamin E fShe will begin tamoxifen on 01/06/2016. She will meet with [facility] in survivorship on 02/03/2016. She can always call me with questions.  I will follow up with her on an as needed basis.     ------------------------------------------------  Thea Silversmith, MD  This document serves as a record of services personally performed by Thea Silversmith, MD. It was created on her behalf by Jenell Milliner, a trained medical scribe. The creation of this record is based on the scribe's personal observations and the provider's statements to them. This  document has been checked and approved by the attending provider.

## 2015-12-30 NOTE — Addendum Note (Signed)
Encounter addended by: Malena Edman, RN on: 12/30/2015  9:02 AM<BR>     Documentation filed: Charges VN, Chief Complaint Section

## 2016-01-06 NOTE — Addendum Note (Signed)
Encounter addended by: Malena Edman, RN on: 01/06/2016 11:51 AM<BR>     Documentation filed: Charges VN

## 2016-02-03 ENCOUNTER — Ambulatory Visit (HOSPITAL_BASED_OUTPATIENT_CLINIC_OR_DEPARTMENT_OTHER): Payer: Managed Care, Other (non HMO) | Admitting: Nurse Practitioner

## 2016-02-03 ENCOUNTER — Encounter: Payer: Self-pay | Admitting: Nurse Practitioner

## 2016-02-03 VITALS — BP 123/44 | HR 61 | Temp 98.4°F | Resp 18 | Ht 65.0 in | Wt 140.4 lb

## 2016-02-03 DIAGNOSIS — Z170 Estrogen receptor positive status [ER+]: Secondary | ICD-10-CM | POA: Diagnosis not present

## 2016-02-03 DIAGNOSIS — D0511 Intraductal carcinoma in situ of right breast: Secondary | ICD-10-CM | POA: Diagnosis not present

## 2016-02-03 DIAGNOSIS — C50411 Malignant neoplasm of upper-outer quadrant of right female breast: Secondary | ICD-10-CM | POA: Diagnosis not present

## 2016-02-03 NOTE — Progress Notes (Signed)
CLINIC:  Cancer Survivorship   REASON FOR VISIT:  Routine follow-up post-treatment for a recent history of breast cancer.  BRIEF ONCOLOGIC HISTORY:    Breast cancer of upper-outer quadrant of right female breast ([place])   08/17/2015 Mammogram Dx imaging to evaluate palpable mass - no correlate; however, right breast upper outer quadrant posteriorly there was a 4 mm area of grouped calcifications.   09/01/2015 Initial Biopsy Right breast bx ([facility]): invasive ductal carcinoma, grade 1, ER/PR+, HER2/neu negative, Ki67 10%   09/06/2015 Breast MRI Susceptibility artifact within the upper-outer right breast compatible with biopsy marking clip. No significant surrounding enhancement is identified at the site of recently biopsied right breast malignancy.   09/06/2015 Clinical Stage Stage IA: T1a N0   09/30/2015 Definitive Surgery Right lumpectomy/SLNB: DCIS, grade 2, negative margins, ER/PR+, no residual invasive dz. 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  Otherwise neg at ATM, BARD1, BRCA1, BRCA2, BRIP1, CDH1, EPCAM, FANCC, MLH1, MSH2, MSH6, NBN, PALB2, PMS2, PTEN, RAD51C, RAD51D, TP53, XRCC2   11/03/2015 - 11/24/2015 Radiation Therapy Adjuvant RT to right breast: 42.720 Gy in 16 fractions   01/06/2016 -  Anti-estrogen oral therapy Tamoxifen 20 mg daily    INTERVAL HISTORY:  Tina Nielsen presents to the [facility] today for our initial meeting to review her survivorship care plan detailing her treatment course for breast cancer, as well as monitoring long-term side effects of that treatment, education regarding health maintenance, screening, and overall wellness and health promotion.     Overall, Tina Nielsen reports feeling quite well since completing her radiation approximately two months ago. She denies fatigue and reports that the skin changes overlying her  right breast have improved.  She denies headache, cough, shortness of breath or bone pain.  She is tolerating the tamoxifen well and denies hot flashes, vaginal bleeding, calf pain / swelling or dyspnea.  She has a good appetite and denies weight loss.    REVIEW OF SYSTEMS:  General: Denies fever, chills, unintentional weight loss, or generalized fatigue.  HEENT: Denies visual changes, hearing loss, mouth sores or difficulty swallowing. Cardiac: Denies palpitations, chest pain, and lower extremity edema.  Respiratory: As above. Breast: As above. GI: Denies abdominal pain, constipation, diarrhea, nausea, or vomiting.  GU: Denies dysuria, hematuria, vaginal bleeding, vaginal discharge, or vaginal dryness.  Musculoskeletal: As above. Neuro: Denies recent fall or numbness / tingling in her extremities. Skin: Denies rash, pruritis, or open wounds.  Psych: Denies depression, anxiety, insomnia, or memory loss.   A 14-point review of systems was completed and was negative, except as noted above.   ONCOLOGY TREATMENT TEAM:  1. Surgeon:  Dr. Donne Hazel at [facility]  2. Medical Oncologist: [name] 3. Radiation Oncologist: Dr. Pablo Ledger    PAST MEDICAL/SURGICAL HISTORY:  Past Medical History  Diagnosis Date  . Osteopenia   . Atrophic vaginitis   . High cholesterol   . Arthritis     knees  . Breast cancer of upper-outer quadrant of right female breast ([place]) 09/03/2015  . Anxiety   . Family history of breast cancer    Past Surgical History  Procedure Laterality Date  . Breast surgery      BIOPSY  . Tubal ligation    . Knee surgery    . Breast lumpectomy with needle localization and axillary sentinel lymph node bx Right 09/30/2015    Procedure:  RIGHT BREAST LUMPECTOMY WITH NEEDLE LOCALIZATION AND AXILLARY SENTINEL LYMPH NODE BX;  Surgeon: Rolm Bookbinder, MD;  Location: [place];  Service: General;  Laterality: Right;     ALLERGIES:  Allergies    Allergen Reactions  . Aleve [Naproxen] Other (See Comments)    "stomach irritation"  . Celebrex [Celecoxib]     Irritated stomach- caused burning  . Ibuprofen     "UPSET STOMACH"- burning     CURRENT MEDICATIONS:  Current Outpatient Prescriptions on File Prior to Visit  Medication Sig Dispense Refill  . acetaminophen (TYLENOL) 500 MG tablet Take 500 mg by mouth every 6 (six) hours as needed.    . Calcium Carbonate-Vitamin D (CALCIUM + D PO) Take by mouth.      . escitalopram (LEXAPRO) 10 MG tablet take 1 tablet by mouth once daily 30 tablet 12  . Glucosamine-Chondroitin (GLUCOSAMINE CHONDR COMPLEX PO) Take by mouth.      . Multiple Vitamin (MULTIVITAMIN) capsule Take 1 capsule by mouth daily.      . pravastatin (PRAVACHOL) 40 MG tablet   0  . tamoxifen (NOLVADEX) 20 MG tablet Take 20 mg by mouth daily. Reported on 12/30/2015  1  . VITAMIN E PO Take by mouth.      . hyaluronate sodium (RADIAPLEXRX) GEL Apply 1 application topically 2 (two) times daily. Reported on 02/03/2016     No current facility-administered medications on file prior to visit.     ONCOLOGIC FAMILY HISTORY:  Family History  Problem Relation Age of Onset  . Hypertension Mother   . Breast cancer Mother 80    second cancer at 27  . Heart disease Mother   . Aneurysm Father     AORTIC  . Heart disease Maternal Grandfather   . Colon cancer Neg Hx   . Kidney cancer Maternal Aunt 4  . Prostate cancer Paternal Uncle 56  . Stroke Maternal Grandmother   . Breast cancer Other     MGMs sister  . Breast cancer Other     PGMs sister  . Breast cancer Other     PGF sister     GENETIC COUNSELING/TESTING: No    SOCIAL HISTORY:  Tina Nielsen is married and lives with her spouse in [place], [place].  She has 2 children. 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  She is a former smoker and drinks wine daily.  She denies any current or history of illicit drug use.      PHYSICAL EXAMINATION:  Vital Signs: Filed Vitals:   02/03/16 1425 02/03/16 1426  BP: 123/44   Pulse: 61   Temp: 98.4 F (36.9 C)   Resp:  18   ECOG Performance Status: 0  General: Well-nourished, well-appearing female in no acute distress.  She is unaccompanied in clinic today.   HEENT: Head is atraumatic and normocephalic.  Pupils equal and reactive to light and accomodation. Conjunctivae clear without exudate.  Sclerae anicteric. Oral mucosa is pink, moist, and intact without lesions.  Oropharynx is pink without lesions or erythema.  Lymph: No cervical, supraclavicular, infraclavicular, or axillary lymphadenopathy noted on palpation.  Cardiovascular: Regular rate and rhythm without murmurs, rubs, or gallops. Respiratory: Clear to auscultation bilaterally. Chest expansion symmetric without accessory muscle use on inspiration or expiration.  GI: Abdomen soft and round. No tenderness to palpation. Bowel sounds normoactive in 4 quadrants.  GU: Deferred.   Neuro: No focal deficits. Steady gait.  Psych: Mood and affect  normal and appropriate for situation.  Extremities: No edema, cyanosis, or clubbing.  Skin: Warm and dry. No open lesions noted.   LABORATORY DATA:  None for this visit.  DIAGNOSTIC IMAGING:  None for this visit.     ASSESSMENT AND PLAN:   1. Breast cancer: Stage 0 ductal carcinoma in situ and Stage IA (at time of biopsy) invasive ductal carinoma of the right breast grade, ER positive, PR positive, HER2/neu negative, S/P right lumpectomy/SLNB (09/2015) with no residual invasive disease at time of surgery, S/P adjuvant radiation therapy to the right breast (completed 11/2015) now on adjuvant endocrine therapy with tamoxifen (begun 01/2016).  Ms. Meador is doing well without clinical symptoms worrisome for disease recurrence. She will follow-up with her medical oncologist,  [name], in September 2017 with history and physical examination per surveillance protocol.   She will continue her anti-estrogen therapy with tamoxifen at this time.  She was instructed to make [place] aware if she begins to experience any new or increased side effects of the medication. Though the incidence is low, there is an associated risk of endometrial cancer with anti-estrogen therapies like Tamoxifen.  Ms. Glatfelter was encouraged to contact [name] or myself with any vaginal bleeding while taking Tamoxifen. Other side effects of Tamoxifen were again reviewed with her as well. A comprehensive survivorship care plan and treatment summary was reviewed with the patient today detailing her breast cancer diagnosis, treatment course, potential late/long-term effects of treatment, appropriate follow-up care with recommendations for the future, and patient education resources.  A copy of this summary, along with a letter will be sent to the patient's primary care provider via in basket message after today's visit.  Ms. Abshier is welcome to return to the Survivorship Clinic in the future, as needed; no follow-up will be scheduled at this time.    3. Cancer screening:  Due to Ms. Bady's history and her age, she should receive screening for skin cancers, colon cancer, and gynecologic cancers.  The information and recommendations are listed on the patient's comprehensive care plan/treatment summary and were reviewed in detail with the patient.    4. Health maintenance and wellness promotion: Ms. Rickett was encouraged to consume 5-7 servings of fruits and vegetables per day. We reviewed the "Nutrition Rainbow" handout, as well as discussed recommendations to maximize nutrition and minimize recurrence, such as increased intake of fruits, vegetables, lean proteins, and minimizing the intake of red meats and processed foods.  She was also encouraged to engage in moderate to vigorous exercise for 30 minutes per day most days of the week. We discussed the [employer] fitness program, which is designed for  cancer survivors to help them become more physically fit after cancer treatments.  She was instructed to limit her alcohol consumption and continue to abstain from tobacco use.  A copy of the "Take Control of Your Health" brochure was given to her reinforcing these recommendations.   5. Support services/counseling: It is not uncommon for this period of the patient's cancer care trajectory to be one of many emotions and stressors.  We discussed an opportunity for her to participate in the next session of [facility] ("Finding Your New Normal") support group series designed for patients after they have completed treatment.   Ms. Agostinelli was encouraged to take advantage of our many other support services programs, support groups, and/or counseling in coping with her new life as a cancer survivor after completing anti-cancer treatment.  She was offered support today through active  listening and expressive supportive counseling.  She was given information regarding our available services and encouraged to contact me with any questions or for help enrolling in any of our support group/programs.    A total of 50 minutes of face-to-face time was spent with this patient with greater than 50% of that time in counseling and care-coordination.   Sylvan Cheese, NP  Survivorship Program [facility] [phone]   Note: PRIMARY CARE PROVIDER Jonathon Bellows, [place] [phone]

## 2016-04-18 ENCOUNTER — Ambulatory Visit (HOSPITAL_BASED_OUTPATIENT_CLINIC_OR_DEPARTMENT_OTHER): Payer: Managed Care, Other (non HMO) | Admitting: Oncology

## 2016-04-18 VITALS — BP 120/47 | HR 60 | Temp 98.4°F | Resp 18 | Ht 65.0 in | Wt 142.3 lb

## 2016-04-18 DIAGNOSIS — C50411 Malignant neoplasm of upper-outer quadrant of right female breast: Secondary | ICD-10-CM | POA: Diagnosis not present

## 2016-04-18 DIAGNOSIS — Z1509 Genetic susceptibility to other malignant neoplasm: Secondary | ICD-10-CM

## 2016-04-18 DIAGNOSIS — Z1589 Genetic susceptibility to other disease: Secondary | ICD-10-CM | POA: Diagnosis not present

## 2016-04-18 DIAGNOSIS — Z1502 Genetic susceptibility to malignant neoplasm of ovary: Secondary | ICD-10-CM

## 2016-04-18 DIAGNOSIS — C50919 Malignant neoplasm of unspecified site of unspecified female breast: Secondary | ICD-10-CM

## 2016-04-18 MED ORDER — TAMOXIFEN CITRATE 20 MG PO TABS: 20.0000 mg | ORAL_TABLET | Freq: Every day | ORAL | 4 refills | Status: DC

## 2016-04-18 NOTE — Progress Notes (Signed)
[place]  [phone] Fax:([phone]) [phone]     ID: ZYLPHA POYNOR DOB: 10/04/1950  MR#: [id number]  GXQ#:[id number]  Patient Care Team: Maurice Small, MD as PCP - General (Family Medicine) Chauncey Cruel, MD as Consulting Physician (Oncology) Rolm Bookbinder, MD as Consulting Physician (General Surgery) Thea Silversmith, MD as Consulting Physician (Radiation Oncology) OTHER MD:  CHIEF COMPLAINT: Estrogen receptor positive breast cancer  CURRENT TREATMENT: Awaiting definitive surgery   BREAST CANCER HISTORY:  From the original intake note:  "Tina Nielsen" noted a possible lump in her left breast and was referred for diagnostic mammography with tomosynthesis at [facility] 08/17/2015. The breast composition was category D. There was no finding in the left breast to correlate with the patient's lower inner quadrant concern. Ultrasound was obtained the same day and was likewise unremarkable.   However in the right breast upper outer quadrant posteriorly there was a 4 mm area of grouped calcifications. This was felt to warrant biopsy, which was performed stereotactically 09/01/2015. The final pathology ([facility] [id number]) showed an invasive ductal carcinoma, grade 1, estrogen receptor 100% positive, progesterone receptor 90% positive, both with strong staining intensity, with an MIB-1 of 10%, and no HER-2 amplification, the signals ratio being 1.17 and the number per cell 2.35.  Bilateral breast MRI was obtained 09/06/2015. This describes the breast density as category C. In the upper outer quadrant of the right breast there was a clip. There was no suspicious surrounding enhancement. More laterally there was a 5.1 cm fluid collection compatible with a hematoma. The left breast was normal and there was no abnormal appearing adenopathy.   Her subsequent history is as detailed below.  INTERVAL HISTORY: Tina Nielsen returns today for follow-up of her breast cancer. Since her last visit  here she completed her radiation treatments and started tamoxifen in early. She has some initial hot flashes but these have "worn off". She does have vaginal wetness issues and she describes it as "sticky and achy", but she wears a pad and that generally takes care of the problem. 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 She is not exercising regularly. She is helping her 41 year old mother move. Overall it detailed review of systems today was noncontributory  PAST MEDICAL HISTORY: Past Medical History:  Diagnosis Date  . Anxiety   . Arthritis    knees  . Atrophic vaginitis   . Breast cancer of upper-outer quadrant of right female breast ([place]) 09/03/2015  . Family history of breast cancer   . High cholesterol   . Osteopenia     PAST SURGICAL HISTORY: Past Surgical History:  Procedure Laterality Date  . BREAST LUMPECTOMY WITH NEEDLE LOCALIZATION AND AXILLARY SENTINEL LYMPH NODE BX Right 09/30/2015   Procedure: RIGHT BREAST LUMPECTOMY WITH NEEDLE LOCALIZATION AND AXILLARY SENTINEL LYMPH NODE BX;  Surgeon: Rolm Bookbinder, MD;  Location: [place];  Service: General;  Laterality: Right;  . BREAST SURGERY     BIOPSY  . KNEE SURGERY    . TUBAL LIGATION      FAMILY HISTORY Family History  Problem Relation Age of Onset  . Hypertension Mother   . Breast cancer Mother 3    second cancer at 81  . Heart disease Mother   . Aneurysm Father     AORTIC  . Heart disease Maternal Grandfather   . Colon cancer Neg Hx   . Kidney cancer Maternal Aunt  61  . Prostate cancer Paternal Uncle 60  . Stroke Maternal Grandmother   . Breast cancer Other     MGMs sister  . Breast cancer Other     PGMs sister  . Breast cancer Other     PGF sister   the patient's father died from a ruptured aortic aneurysm the age of 32. The patient's mother was diagnosed with breast cancer at age 48. She had a second  breast cancer diagnosed at age 51. She is currently 66 years old. The patient had one brother, no sisters. As far as she is aware that there are no other breast or ovarian cancer instances in the family  GYNECOLOGIC HISTORY:  No LMP recorded. Patient is postmenopausal. Menarche age 19, first live birth age 41, which the patient knows increases the risk of breast cancer. She is GX P2. She stopped having periods around age 64. She did not take hormone replacement. 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 Her husband Tina Nielsen.history as well as coached basketball at the same school. He is now retired but is very active in the [employer]. He knows Tina Nielsen well and has quite a few stories to tell regarding [employer] [facility] [employer] basketball. Their son Tina Nielsen lives in [place] and works in Designer, television/film set as a Environmental manager. Son Tina Nielsen lives in [place] working as a Designer, jewellery. The children have been made aware of the CHEK2 mutation and are considering testing.The patient has no grandchildren.    ADVANCED DIRECTIVES: In place   HEALTH MAINTENANCE: Social History  Substance Use Topics  . Smoking status: Former Smoker    Quit date: 08/07/1976  . Smokeless tobacco: Never Used  . Alcohol use 4.2 oz/week    7 Glasses of wine per week     Colonoscopy: 2015/[facility]  PAP:  Bone density: October 2015, osteopenia  Lipid panel:  Allergies  Allergen Reactions  . Aleve [Naproxen] Other (See Comments)    "stomach irritation"  . Celebrex [Celecoxib]     Irritated stomach- caused burning  . Ibuprofen     "UPSET STOMACH"- burning    Current Outpatient Prescriptions  Medication Sig Dispense Refill  . mometasone (NASONEX) 50 MCG/ACT nasal spray Place 2 sprays into the nose daily.    Marland Kitchen acetaminophen (TYLENOL) 500 MG tablet Take 500 mg by mouth every 6 (six) hours as  needed.    . Calcium Carbonate-Vitamin D (CALCIUM + D PO) Take by mouth.      . escitalopram (LEXAPRO) 10 MG tablet take 1 tablet by mouth once daily 30 tablet 12  . Glucosamine-Chondroitin (GLUCOSAMINE CHONDR COMPLEX PO) Take by mouth.      . hyaluronate sodium (RADIAPLEXRX) GEL Apply 1 application topically 2 (two) times daily. Reported on 02/03/2016    . Multiple Vitamin (MULTIVITAMIN) capsule Take 1 capsule by mouth daily.      . pravastatin (PRAVACHOL) 40 MG tablet   0  . tamoxifen (NOLVADEX) 20 MG tablet Take 1 tablet (20 mg total) by mouth daily. Reported on 12/30/2015 90 tablet 4  . VITAMIN E PO Take by mouth.       No current facility-administered medications for this visit.     OBJECTIVE: Middle-aged white woman In no acute distress Vitals:   04/18/16 1533  BP: (!) 120/47  Pulse: 60  Resp: 18  Temp: 98.4 F (36.9 C)     Body  mass index is 23.68 kg/m.    ECOG FS:0 - Asymptomatic  Sclerae unicteric, EOMs intact Oropharynx clear and moist No cervical or supraclavicular adenopathy Lungs no rales or rhonchi Heart regular rate and rhythm Abd soft, nontender, positive bowel sounds MSK no focal spinal tenderness, no upper extremity lymphedema Neuro: nonfocal, well oriented, appropriate affect Breasts: The right breast is status post lumpectomy and radiation. There is no evidence of disease recurrence. The cosmetic result is good. The right axilla is benign. The left breast is unremarkable.   LAB RESULTS:  CMP     Component Value Date/Time   NA 144 09/27/2015 0850   NA 141 09/16/2015 1612   K 3.3 (L) 09/27/2015 0850   K 3.8 09/16/2015 1612   CL 106 09/27/2015 0850   CO2 29 09/27/2015 0850   CO2 29 09/16/2015 1612   GLUCOSE 85 09/27/2015 0850   GLUCOSE 103 09/16/2015 1612   BUN 9 09/27/2015 0850   BUN 16.1 09/16/2015 1612   CREATININE 0.85 09/27/2015 0850   CREATININE 0.9 09/16/2015 1612   CALCIUM 9.5 09/27/2015 0850   CALCIUM 9.1 09/16/2015 1612   PROT 6.9  09/16/2015 1612   ALBUMIN 3.8 09/16/2015 1612   AST 22 09/16/2015 1612   ALT 14 09/16/2015 1612   ALKPHOS 65 09/16/2015 1612   BILITOT 0.77 09/16/2015 1612   GFRNONAA >60 09/27/2015 0850   GFRAA >60 09/27/2015 0850    INo results found for: SPEP, UPEP  Lab Results  Component Value Date   WBC 7.8 09/27/2015   NEUTROABS 5.9 09/27/2015   HGB 12.3 09/27/2015   HCT 38.2 09/27/2015   MCV 91.8 09/27/2015   PLT 205 09/27/2015      Chemistry      Component Value Date/Time   NA 144 09/27/2015 0850   NA 141 09/16/2015 1612   K 3.3 (L) 09/27/2015 0850   K 3.8 09/16/2015 1612   CL 106 09/27/2015 0850   CO2 29 09/27/2015 0850   CO2 29 09/16/2015 1612   BUN 9 09/27/2015 0850   BUN 16.1 09/16/2015 1612   CREATININE 0.85 09/27/2015 0850   CREATININE 0.9 09/16/2015 1612      Component Value Date/Time   CALCIUM 9.5 09/27/2015 0850   CALCIUM 9.1 09/16/2015 1612   ALKPHOS 65 09/16/2015 1612   AST 22 09/16/2015 1612   ALT 14 09/16/2015 1612   BILITOT 0.77 09/16/2015 1612       No results found for: LABCA2  No components found for: LABCA125  No results for input(s): INR in the last 168 hours.  Urinalysis    Component Value Date/Time   COLORURINE YELLOW 12/07/2015 1543   APPEARANCEUR CLEAR 12/07/2015 1543   LABSPEC 1.012 12/07/2015 1543   PHURINE 5.5 12/07/2015 1543   GLUCOSEU NEGATIVE 12/07/2015 1543   HGBUR NEGATIVE 12/07/2015 1543   BILIRUBINUR NEGATIVE 12/07/2015 1543   KETONESUR NEGATIVE 12/07/2015 1543   PROTEINUR NEGATIVE 12/07/2015 1543   UROBILINOGEN 0.2 02/10/2014 0846   NITRITE NEGATIVE 12/07/2015 1543   LEUKOCYTESUR NEGATIVE 12/07/2015 1543      ELIGIBLE FOR AVAILABLE RESEARCH PROTOCOL: no  STUDIES: No results found.  ASSESSMENT: 65 y.o. [place] woman status post right breast upper outer quadrant biopsy 09/01/2015 for a clinical T1a N0, stage IA invasive ductal carcinoma, grade 1, estrogen and progesterone receptor positive, HER-2 not amplified,  with an MIB-1 of 10%  (1) genetics testing 10/19/2015 through the Breast/Ovarian gene panel offered by [facility] found no deleterious mutations in ATM, BARD1, BRCA1, BRCA2,  BRIP1, CDH1, EPCAM, FANCC, MLH1, MSH2, MSH6, NBN, PALB2, PMS2, PTEN, RAD51C, RAD51D, TP53, and XRCC2.  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   (b) Genetic testing also detected a Variant of Unknown Significance in the BRCA2 gene called [id number].  (2) status post right lumpectomy with sentinel lymph node sampling 09/30/2015 for a pTis pN0, stage 0 ductal carcinoma in situ, grade 2, with negative margins  (3) adjuvant radiation completed May 2017  (4) started tamoxifen 01/06/2016  PLAN: Tina Nielsen is tolerating tamoxifen well and the plan will be to continue that a minimum of 5  We went over the Chek 2 mutation issues. She has led both are sons know of the condition so that they may be tested at her discretion  NCCN guidelines for this condition call for yearly MRI in addition to yearly mammography. Accordingly I have set her up for an MRI of the breast in December. She will see Dr. Donne Hazel in January to discuss those results and then she will see me again in April. She can then have her mammography with tomography in June, keeping the study 6 months apart for maximal information  She has a good understanding of this plan. She she agrees with it. She knows a goal of treatment in her case is cure. She will call with any problems that may develop before the next visit here.  Chauncey Cruel, MD   04/18/2016 6:44 PM Medical Oncology and Hematology [facility] [address] [place],  [address] Tel. [phone]    Fax. [phone]

## 2016-04-19 ENCOUNTER — Other Ambulatory Visit: Payer: Self-pay

## 2016-04-19 DIAGNOSIS — C50411 Malignant neoplasm of upper-outer quadrant of right female breast: Secondary | ICD-10-CM

## 2016-05-01 ENCOUNTER — Telehealth: Payer: Self-pay | Admitting: Oncology

## 2016-05-01 NOTE — Telephone Encounter (Signed)
Faxed pt records to [place] [phone]

## 2016-07-11 ENCOUNTER — Telehealth: Payer: Self-pay | Admitting: *Deleted

## 2016-07-11 ENCOUNTER — Other Ambulatory Visit: Payer: Self-pay | Admitting: Oncology

## 2016-07-11 DIAGNOSIS — C50411 Malignant neoplasm of upper-outer quadrant of right female breast: Secondary | ICD-10-CM

## 2016-07-11 DIAGNOSIS — D0511 Intraductal carcinoma in situ of right breast: Secondary | ICD-10-CM

## 2016-07-11 DIAGNOSIS — Z853 Personal history of malignant neoplasm of breast: Secondary | ICD-10-CM

## 2016-07-11 NOTE — Telephone Encounter (Signed)
"  Patient scheduled for diagnostic mammogram within six months before MRI breast can be performed tomorrow.  Please have [name] sign order for bilateral diagnostic mammogram today.  Mammogram will have to be done before MRI breast.  He can call me, Amber with the [place]."  Patient scheduled tomorrow at 4:40 pm.

## 2016-07-12 ENCOUNTER — Inpatient Hospital Stay: Admission: RE | Admit: 2016-07-12 | Payer: Managed Care, Other (non HMO) | Source: Ambulatory Visit

## 2016-07-12 ENCOUNTER — Other Ambulatory Visit: Payer: Self-pay | Admitting: *Deleted

## 2016-07-13 ENCOUNTER — Other Ambulatory Visit: Payer: Self-pay | Admitting: *Deleted

## 2016-07-21 ENCOUNTER — Other Ambulatory Visit: Payer: Self-pay | Admitting: *Deleted

## 2016-08-15 ENCOUNTER — Other Ambulatory Visit: Payer: Self-pay | Admitting: Oncology

## 2016-08-18 ENCOUNTER — Inpatient Hospital Stay: Admission: RE | Admit: 2016-08-18 | Payer: Managed Care, Other (non HMO) | Source: Ambulatory Visit

## 2016-08-28 ENCOUNTER — Other Ambulatory Visit: Payer: Self-pay | Admitting: *Deleted

## 2016-09-01 ENCOUNTER — Telehealth: Payer: Self-pay | Admitting: *Deleted

## 2016-09-01 NOTE — Telephone Encounter (Signed)
Pt called and left message in triage voicemail c/o pink vaginal discharge asked if this could be relayed to taking tamoxifen I called pt back and received her voicemail and told her to schedule OV with provider for exam.

## 2016-09-14 ENCOUNTER — Other Ambulatory Visit: Payer: Self-pay | Admitting: Women's Health

## 2016-09-14 ENCOUNTER — Encounter: Payer: Self-pay | Admitting: Women's Health

## 2016-09-14 ENCOUNTER — Ambulatory Visit (INDEPENDENT_AMBULATORY_CARE_PROVIDER_SITE_OTHER): Payer: Managed Care, Other (non HMO) | Admitting: Women's Health

## 2016-09-14 VITALS — BP 110/70

## 2016-09-14 DIAGNOSIS — N950 Postmenopausal bleeding: Secondary | ICD-10-CM

## 2016-09-14 DIAGNOSIS — N898 Other specified noninflammatory disorders of vagina: Secondary | ICD-10-CM

## 2016-09-14 LAB — WET PREP FOR TRICH, YEAST, CLUE
Clue Cells Wet Prep HPF POC: NONE SEEN
Trich, Wet Prep: NONE SEEN
Yeast Wet Prep HPF POC: NONE SEEN

## 2016-09-14 NOTE — Progress Notes (Signed)
Presents with complaint of increased vaginal discharge, not daily for the past few months. Yellow to green to pink in color with no vaginal irritation, itching, odor, or pain. Not sexually active. 09/2015 right breast invasive ductal carcinoma  ER PR positive HER-2 negative had lumpectomy and is now on tamoxifen. 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  Exam: Appears well. External genitalia within normal limits, speculum exam no visible discharge or blood noted, wet prep negative. Manual no CMT or adnexal tenderness or fullness.  Increased vaginal discharge / questionable blood/ on tamoxifen  Plan: Reviewed normality of exam and wet prep. Schedule ultrasound to check endometrial thickness.

## 2016-10-04 ENCOUNTER — Ambulatory Visit (INDEPENDENT_AMBULATORY_CARE_PROVIDER_SITE_OTHER): Payer: Managed Care, Other (non HMO)

## 2016-10-04 ENCOUNTER — Other Ambulatory Visit: Payer: Self-pay | Admitting: Women's Health

## 2016-10-04 ENCOUNTER — Encounter: Payer: Self-pay | Admitting: Women's Health

## 2016-10-04 ENCOUNTER — Ambulatory Visit (INDEPENDENT_AMBULATORY_CARE_PROVIDER_SITE_OTHER): Payer: Managed Care, Other (non HMO) | Admitting: Women's Health

## 2016-10-04 DIAGNOSIS — N950 Postmenopausal bleeding: Secondary | ICD-10-CM

## 2016-10-04 DIAGNOSIS — R938 Abnormal findings on diagnostic imaging of other specified body structures: Secondary | ICD-10-CM | POA: Diagnosis not present

## 2016-10-04 DIAGNOSIS — N83201 Unspecified ovarian cyst, right side: Secondary | ICD-10-CM | POA: Diagnosis not present

## 2016-10-04 DIAGNOSIS — R9389 Abnormal findings on diagnostic imaging of other specified body structures: Secondary | ICD-10-CM

## 2016-10-04 NOTE — Progress Notes (Signed)
Presents for ultrasound. 09/14/2016 reported increased brown discharge over the past few months with negative wet prep. 09/2015 right breast invasive ductal carcinoma ER/PR positive HER-2 negative had lumpectomy is now on tamoxifen. 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 Denies abdominal pain, urinary symptoms or fever.  Exam: Appears well. Ultrasound: T/V anteverted uterus heterogeneous echo pattern. Prominent endometrium with cystic solid areas negative CFD. Endometrium 10.8 mm. Right ovary thin-walled echo-free avascular cyst 17 x 12 x 17 mm, negative CFD. Left ovary not seen. Negative cul-de-sac. No apparent mass left or right adnexal, T/V and T/A.  Thickened endometrium on tamoxifen Small right ovarian avascular cyst  Plan: Sonohysterogram with biopsy with Dr.  Phineas Real. CA 125. Reviewed ultrasound findings, questions answered.

## 2016-10-04 NOTE — Patient Instructions (Signed)
Postmenopausal Bleeding Postmenopausal bleeding is any bleeding a woman has after she has entered into menopause. Menopause is the end of a woman's fertile years. After menopause, a woman no longer ovulates or has menstrual periods. Postmenopausal bleeding can be caused by various things. Any type of postmenopausal bleeding, even if it appears to be a typical menstrual period, is concerning. This should be evaluated by your health care provider. Any treatment will depend on the cause of the bleeding. Follow these instructions at home: Monitor your condition for any changes. The following actions may help to alleviate any discomfort you are experiencing:  Avoid the use of tampons and douches as directed by your health care provider.  Change your pads frequently.  Get regular pelvic exams and Pap tests.  Keep all follow-up appointments for diagnostic tests as directed by your health care provider. Contact a health care provider if:  Your bleeding lasts more than 1 week.  You have abdominal pain.  You have bleeding with sexual intercourse. Get help right away if:  You have a fever, chills, headache, dizziness, muscle aches, and bleeding.  You have severe pain with bleeding.  You are passing blood clots.  You have bleeding and need more than 1 pad an hour.  You feel faint. This information is not intended to replace advice given to you by your health care provider. Make sure you discuss any questions you have with your health care provider. Document Released: 11/01/2005 Document Revised: 12/30/2015 Document Reviewed: 02/20/2013 Elsevier Interactive Patient Education  2017 Elsevier Inc.  

## 2016-10-09 ENCOUNTER — Other Ambulatory Visit: Payer: Self-pay | Admitting: Gynecology

## 2016-10-09 DIAGNOSIS — N950 Postmenopausal bleeding: Secondary | ICD-10-CM

## 2016-10-11 ENCOUNTER — Ambulatory Visit (INDEPENDENT_AMBULATORY_CARE_PROVIDER_SITE_OTHER): Payer: Managed Care, Other (non HMO) | Admitting: Gynecology

## 2016-10-11 ENCOUNTER — Ambulatory Visit (INDEPENDENT_AMBULATORY_CARE_PROVIDER_SITE_OTHER): Payer: Managed Care, Other (non HMO)

## 2016-10-11 ENCOUNTER — Encounter: Payer: Self-pay | Admitting: Gynecology

## 2016-10-11 VITALS — BP 124/74

## 2016-10-11 DIAGNOSIS — N950 Postmenopausal bleeding: Secondary | ICD-10-CM

## 2016-10-11 MED ORDER — FLUCONAZOLE 150 MG PO TABS: 150.0000 mg | ORAL_TABLET | Freq: Once | ORAL | 0 refills | Status: AC

## 2016-10-11 NOTE — Patient Instructions (Signed)
Office will call you with biopsy results. Try the Diflucan 150 mg tablet once to see if it doesn't help with the vaginal irritation. Hold the cholesterol medicine the day you take your Diflucan

## 2016-10-11 NOTE — Progress Notes (Signed)
    Tina Nielsen [birth year] [id number]        66 y.o.  G2P2 presents for sonohysterogram. Son Tina Nielsen with a history of brownish discharge over the last several months. History of right breast cancer on tamoxifen. Ultrasound performed which showed a thicker endometrial echo at 10.8 mm.  Past medical history,surgical history, problem list, medications, allergies, family history and social history were all reviewed and documented in the EPIC chart.  Directed ROS with pertinent positives and negatives documented in the history of present illness/assessment and plan.  Exam: Tina Nielsen assistant Vitals:   10/11/16 1648  BP: 124/74   General appearance:  Normal External BUS vagina with atrophic changes. Cervix atrophic. Uterus normal size midline mobile nontender. Adnexa without masses or tenderness.  Ultrasound transvaginal shows uterus normal size and echotexture. Endometrial echo 9.3 mm. Right ovaries with small simple cyst 17 x 12 x 17 mm. No peripheral flow previously noted.  Left ovary normal with small 8 x 5 mm follicle. Cul-de-sac negative.  Sonohysterogram performed, sterile technique, single tooth tenaculum applied to the anterior cervix for catheter placement. Good distention with no abnormalities noted. Endometrial sample taken. Patient tolerated well.  Assessment/Plan:  66 y.o. G2P2 with brownish staining. Thicker endometrial echo on ultrasound consistent with her use of tamoxifen. No polyps or other pathology noted on sonohysterogram. Patient will follow up for biopsy results. If negative then plan expectant management. She does note some itching. Question whether she may have some yeast contributing to this. We'll treat arbitrarily with Diflucan 150 mg 1 dose. Hold cholesterol medicine day of medication. Follow up if symptoms persist.    Tina Auerbach MD, 4:58 PM 10/11/2016

## 2016-10-25 ENCOUNTER — Telehealth: Payer: Self-pay | Admitting: Oncology

## 2016-10-25 NOTE — Telephone Encounter (Signed)
lvm to inform pt of r/s 4/12 appt to 5/3 at 2 pm per GM on PAl

## 2016-11-09 ENCOUNTER — Other Ambulatory Visit: Payer: Managed Care, Other (non HMO)

## 2016-11-16 ENCOUNTER — Ambulatory Visit: Payer: Managed Care, Other (non HMO) | Admitting: Oncology

## 2016-12-07 ENCOUNTER — Ambulatory Visit (HOSPITAL_BASED_OUTPATIENT_CLINIC_OR_DEPARTMENT_OTHER): Payer: Managed Care, Other (non HMO) | Admitting: Oncology

## 2016-12-07 ENCOUNTER — Other Ambulatory Visit (HOSPITAL_BASED_OUTPATIENT_CLINIC_OR_DEPARTMENT_OTHER): Payer: Managed Care, Other (non HMO)

## 2016-12-07 VITALS — BP 116/69 | HR 68 | Temp 98.4°F | Resp 18 | Ht 65.0 in | Wt 141.9 lb

## 2016-12-07 DIAGNOSIS — Z1509 Genetic susceptibility to other malignant neoplasm: Secondary | ICD-10-CM

## 2016-12-07 DIAGNOSIS — Z79810 Long term (current) use of selective estrogen receptor modulators (SERMs): Secondary | ICD-10-CM

## 2016-12-07 DIAGNOSIS — C50411 Malignant neoplasm of upper-outer quadrant of right female breast: Secondary | ICD-10-CM

## 2016-12-07 DIAGNOSIS — Z170 Estrogen receptor positive status [ER+]: Secondary | ICD-10-CM | POA: Diagnosis not present

## 2016-12-07 DIAGNOSIS — D0511 Intraductal carcinoma in situ of right breast: Secondary | ICD-10-CM | POA: Diagnosis not present

## 2016-12-07 DIAGNOSIS — Z1589 Genetic susceptibility to other disease: Secondary | ICD-10-CM

## 2016-12-07 DIAGNOSIS — C50919 Malignant neoplasm of unspecified site of unspecified female breast: Secondary | ICD-10-CM

## 2016-12-07 DIAGNOSIS — Z1502 Genetic susceptibility to malignant neoplasm of ovary: Secondary | ICD-10-CM

## 2016-12-07 LAB — COMPREHENSIVE METABOLIC PANEL
ALBUMIN: 3.6 g/dL (ref 3.5–5.0)
ALK PHOS: 50 U/L (ref 40–150)
ALT: 18 U/L (ref 0–55)
AST: 21 U/L (ref 5–34)
Anion Gap: 6 mEq/L (ref 3–11)
BILIRUBIN TOTAL: 0.61 mg/dL (ref 0.20–1.20)
BUN: 16.3 mg/dL (ref 7.0–26.0)
CO2: 28 mEq/L (ref 22–29)
Calcium: 8.9 mg/dL (ref 8.4–10.4)
Chloride: 105 mEq/L (ref 98–109)
Creatinine: 0.8 mg/dL (ref 0.6–1.1)
EGFR: 75 mL/min/{1.73_m2} — AB (ref >90)
GLUCOSE: 99 mg/dL (ref 70–140)
Potassium: 3.8 mEq/L (ref 3.5–5.1)
SODIUM: 140 meq/L (ref 136–145)
TOTAL PROTEIN: 6.5 g/dL (ref 6.4–8.3)

## 2016-12-07 LAB — CBC WITH DIFFERENTIAL/PLATELET
BASO%: 0.6 % (ref 0.0–2.0)
BASOS ABS: 0 10*3/uL (ref 0.0–0.1)
EOS%: 2 % (ref 0.0–7.0)
Eosinophils Absolute: 0.1 10*3/uL (ref 0.0–0.5)
HCT: 38.5 % (ref 34.8–46.6)
HEMOGLOBIN: 13 g/dL (ref 11.6–15.9)
LYMPH#: 1.7 10*3/uL (ref 0.9–3.3)
LYMPH%: 28.1 % (ref 14.0–49.7)
MCH: 32.7 pg (ref 25.1–34.0)
MCHC: 33.7 g/dL (ref 31.5–36.0)
MCV: 96.9 fL (ref 79.5–101.0)
MONO#: 0.4 10*3/uL (ref 0.1–0.9)
MONO%: 6.1 % (ref 0.0–14.0)
NEUT%: 63.2 % (ref 38.4–76.8)
NEUTROS ABS: 3.8 10*3/uL (ref 1.5–6.5)
Platelets: 193 10*3/uL (ref 145–400)
RBC: 3.97 10*6/uL (ref 3.70–5.45)
RDW: 12.4 % (ref 11.2–14.5)
WBC: 6 10*3/uL (ref 3.9–10.3)

## 2016-12-07 NOTE — Progress Notes (Signed)
[facility]  [phone] Fax:([phone]) [phone]     ID: KHALI PERELLA DOB: 06-15-1951  MR#: [id number]  [facility]#:[id number]  Patient Care Team: Maurice Small, MD as PCP - General (Family Medicine) Chauncey Cruel, MD as Consulting Physician (Oncology) Rolm Bookbinder, MD as Consulting Physician (General Surgery) Thea Silversmith, MD (Inactive) as Consulting Physician (Radiation Oncology) Christene Slates, MD as Physician Assistant (Radiology) Anastasio Auerbach, MD as Consulting Physician (Gynecology) OTHER MD:  CHIEF COMPLAINT: Estrogen receptor positive breast cancer  CURRENT TREATMENT: Tamoxifen   BREAST CANCER HISTORY:  From the original intake note:  "[name]" noted a possible lump in her left breast and was referred for diagnostic mammography with tomosynthesis at [facility] 08/17/2015. The breast composition was category D. There was no finding in the left breast to correlate with the patient's lower inner quadrant concern. Ultrasound was obtained the same day and was likewise unremarkable.   However in the right breast upper outer quadrant posteriorly there was a 4 mm area of grouped calcifications. This was felt to warrant biopsy, which was performed stereotactically 09/01/2015. The final pathology (SAA [id number]) showed an invasive ductal carcinoma, grade 1, estrogen receptor 100% positive, progesterone receptor 90% positive, both with strong staining intensity, with an MIB-1 of 10%, and no HER-2 amplification, the signals ratio being 1.17 and the number per cell 2.35.  Bilateral breast MRI was obtained 09/06/2015. This describes the breast density as category C. In the upper outer quadrant of the right breast there was a clip. There was no suspicious surrounding enhancement. More laterally there was a 5.1 cm fluid collection compatible with a hematoma. The left breast was normal and there was no abnormal appearing adenopathy.   Her subsequent history is as detailed  below.  INTERVAL HISTORY: Tina Nielsen returns today for follow-up of her estrogen receptor positive breast cancer. She continues on tamoxifen. She generally tolerates it well, and hot flashes in particular are not a major issue. She does have significant vaginal wetness and frequently has to change underpants twice daily. She obtains a drug at a good cost.  Since her last visit here she underwent a transvaginal ultrasound on 10/04/2016 which showed prominent endometrium, sonohysterogram 10/11/2016 showing a normal uterus in size and echo texture with endometrial sampling showing scant atrophic endometrium with no evidence of malignancy or dysplasia (SAA [id number]).  Also since her last visit here the patient's 13 year old mother died, and the patient's brother-in-law (her husband's sister's husband) died from complications of Alzheimer's at age 58.  REVIEW OF SYSTEMS: Tina Nielsen is not currently exercising regularly. 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 A detailed review of systems today was otherwise stable.  PAST MEDICAL HISTORY: Past Medical History:  Diagnosis Date  . Anxiety   . Arthritis    knees  . Atrophic vaginitis   . Breast cancer of upper-outer quadrant of right female breast ([facility]) 09/03/2015  . Family history of breast cancer   . High cholesterol   . Osteopenia     PAST SURGICAL HISTORY: Past Surgical History:  Procedure Laterality Date  . BREAST LUMPECTOMY WITH NEEDLE LOCALIZATION AND AXILLARY SENTINEL LYMPH NODE BX Right 09/30/2015   Procedure: RIGHT BREAST LUMPECTOMY WITH NEEDLE LOCALIZATION AND AXILLARY SENTINEL LYMPH NODE BX;  Surgeon: Rolm Bookbinder, MD;  Location: [facility];  Service: General;  Laterality: Right;  . BREAST SURGERY     BIOPSY  . KNEE SURGERY    . TUBAL LIGATION  FAMILY HISTORY Family History  Problem Relation Age of Onset  . Hypertension Mother   . Breast cancer Mother 50    second cancer at  66  . Heart disease Mother   . Aneurysm Father     AORTIC  . Heart disease Maternal Grandfather   . Colon cancer Neg Hx   . Kidney cancer Maternal Aunt 50  . Prostate cancer Paternal Uncle 39  . Stroke Maternal Grandmother   . Breast cancer Other     MGMs sister  . Breast cancer Other     PGMs sister  . Breast cancer Other     PGF sister   the patient's father died from a ruptured aortic aneurysm the age of 30. The patient's mother was diagnosed with breast cancer at age 45. She had a second breast cancer diagnosed at age 93.She died at age 18. The patient had one brother, no sisters. As far as she is aware that there are no other breast or ovarian cancer instances in the family  GYNECOLOGIC HISTORY:  No LMP recorded. Patient is postmenopausal. Menarche age 53, first live birth age 50, which the patient knows increases the risk of breast cancer. She is GX P2. She stopped having periods around age 41. She did not take hormone replacement. 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 Her husband [name].history as well as coached basketball at the same school. He is now retired but is very active in the [employer]. He knows [name] well and has quite a few stories to tell regarding [employer] [facility] basketball. Their son [name] lives in [place] and works in Designer, television/film set as a Environmental manager. Son [name] lives in [place] working as a Designer, jewellery. The children have been made aware of the CHEK2 mutation and are considering testing.The patient has no grandchildren.    ADVANCED DIRECTIVES: In place   HEALTH MAINTENANCE: Social History  Substance Use Topics  . Smoking status: Former Smoker    Quit date: 08/07/1976  . Smokeless tobacco: Never Used  . Alcohol use 4.2 oz/week    7 Glasses of wine per week     Colonoscopy: 2015/[facility]  PAP:  Bone density:  October 2015, osteopenia  Lipid panel:  Allergies  Allergen Reactions  . Aleve [Naproxen] Other (See Comments)    "stomach irritation"  . Celebrex [Celecoxib]     Irritated stomach- caused burning  . Ibuprofen     "UPSET STOMACH"- burning    Current Outpatient Prescriptions  Medication Sig Dispense Refill  . acetaminophen (TYLENOL) 500 MG tablet Take 500 mg by mouth every 6 (six) hours as needed.    . Calcium Carbonate-Vitamin D (CALCIUM + D PO) Take by mouth.      . escitalopram (LEXAPRO) 10 MG tablet take 1 tablet by mouth once daily 30 tablet 12  . Glucosamine-Chondroitin (GLUCOSAMINE CHONDR COMPLEX PO) Take by mouth.      . hyaluronate sodium (RADIAPLEXRX) GEL Apply 1 application topically 2 (two) times daily. Reported on 02/03/2016    . mometasone (NASONEX) 50 MCG/ACT nasal spray Place 2 sprays into the nose daily.    . Multiple Vitamin (MULTIVITAMIN) capsule Take 1 capsule by mouth daily.      . pravastatin (PRAVACHOL) 40 MG tablet   0  . tamoxifen (NOLVADEX) 20 MG tablet Take 1 tablet (20 mg total) by mouth daily. Reported on 12/30/2015 90  tablet 4  . VITAMIN E PO Take by mouth.       No current facility-administered medications for this visit.     OBJECTIVE: Middle-aged white woman Who appears well  Vitals:   12/07/16 1523  BP: 116/69  Pulse: 68  Resp: 18  Temp: 98.4 F (36.9 C)     Body mass index is 23.61 kg/m.    ECOG FS:0 - Asymptomatic  Sclerae unicteric, pupils round and equal Oropharynx clear and moist No cervical or supraclavicular adenopathy Lungs no rales or rhonchi Heart regular rate and rhythm Abd soft, nontender, positive bowel sounds MSK no focal spinal tenderness, no upper extremity lymphedema Neuro: nonfocal, well oriented, appropriate affect Breasts: The right breast is undergone lumpectomy and radiation with no evidence of local recurrence. The left breast is unremarkable. Both axillae are benign.  LAB RESULTS:  CMP     Component Value  Date/Time   NA 140 12/07/2016 1504   K 3.8 12/07/2016 1504   CL 106 09/27/2015 0850   CO2 28 12/07/2016 1504   GLUCOSE 99 12/07/2016 1504   BUN 16.3 12/07/2016 1504   CREATININE 0.8 12/07/2016 1504   CALCIUM 8.9 12/07/2016 1504   PROT 6.5 12/07/2016 1504   ALBUMIN 3.6 12/07/2016 1504   AST 21 12/07/2016 1504   ALT 18 12/07/2016 1504   ALKPHOS 50 12/07/2016 1504   BILITOT 0.61 12/07/2016 1504   GFRNONAA >60 09/27/2015 0850   GFRAA >60 09/27/2015 0850    INo results found for: SPEP, UPEP  Lab Results  Component Value Date   WBC 6.0 12/07/2016   NEUTROABS 3.8 12/07/2016   HGB 13.0 12/07/2016   HCT 38.5 12/07/2016   MCV 96.9 12/07/2016   PLT 193 12/07/2016      Chemistry      Component Value Date/Time   NA 140 12/07/2016 1504   K 3.8 12/07/2016 1504   CL 106 09/27/2015 0850   CO2 28 12/07/2016 1504   BUN 16.3 12/07/2016 1504   CREATININE 0.8 12/07/2016 1504      Component Value Date/Time   CALCIUM 8.9 12/07/2016 1504   ALKPHOS 50 12/07/2016 1504   AST 21 12/07/2016 1504   ALT 18 12/07/2016 1504   BILITOT 0.61 12/07/2016 1504       No results found for: LABCA2  No components found for: LABCA125  No results for input(s): INR in the last 168 hours.  Urinalysis    Component Value Date/Time   COLORURINE YELLOW 12/07/2015 1543   APPEARANCEUR CLEAR 12/07/2015 1543   LABSPEC 1.012 12/07/2015 1543   PHURINE 5.5 12/07/2015 1543   GLUCOSEU NEGATIVE 12/07/2015 1543   HGBUR NEGATIVE 12/07/2015 1543   BILIRUBINUR NEGATIVE 12/07/2015 1543   KETONESUR NEGATIVE 12/07/2015 1543   PROTEINUR NEGATIVE 12/07/2015 1543   UROBILINOGEN 0.2 02/10/2014 0846   NITRITE NEGATIVE 12/07/2015 1543   LEUKOCYTESUR NEGATIVE 12/07/2015 1543      ELIGIBLE FOR AVAILABLE RESEARCH PROTOCOL: no  STUDIES: Mammography at [facility] 08/11/2016 found a breast density to be category C. There was no evidence of malignancy.   ASSESSMENT: 66 y.o. [place] woman status post right breast upper  outer quadrant biopsy 09/01/2015 for a clinical T1a N0, stage IA invasive ductal carcinoma, grade 1, estrogen and progesterone receptor positive, HER-2 not amplified, with an MIB-1 of 10%  (1) genetics testing 10/19/2015 through the Breast/Ovarian gene panel offered by [facility] found no deleterious mutations in ATM, BARD1, BRCA1, BRCA2, BRIP1, CDH1, EPCAM, FANCC, MLH1, MSH2, MSH6, NBN, PALB2, PMS2, PTEN,  RAD51C, RAD51D, TP53, and XRCC2.  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   (b) Genetic testing also detected a Variant of Unknown Significance in the BRCA2 gene called [id number].  (c) breast cancer intensified screening will consist of breast MRI in July and breast mammography in January yearly  (d) colon cancer screening will consist of colonoscopy every 5 years or more often depending on findings   (i) most recent colonoscopy 07/28/2013; no polyps noted  (2) status post right lumpectomy with sentinel lymph node sampling 09/30/2015 for a pTis pN0, stage 0 ductal carcinoma in situ, grade 2, with negative margins  (3) adjuvant radiation completed May 2017  (4) started tamoxifen 01/06/2016  (a) endometrial biopsy 10/11/2016 shows scant tissue with an atrophic pattern.  PLAN: Tina Nielsen is now a year and a half out from definitive surgery for her breast cancer with no evidence of disease recurrence. This is favorable.  She is tolerating tamoxifen moderately well. She is having significant vaginal wetness. Today we discussed anastrozole as an alternative. She has a good understanding of the possible toxicities, side effects and complications of that agent, but she is deterred because of concerns regarding bone density.  I have set her up for a bone density sometime this month. We will be able to discuss those results in more detail at her next visit with me which will be in February of next year. Before then she will have a breast MRI in mid July  and repeat mammography at [facility] early next year  She knows to call for any problems that may develop before her next visit here.  Chauncey Cruel, MD   12/07/2016 4:03 PM Medical Oncology and Hematology [facility] [address] [place], [place] [address] Tel. [phone]    Fax. [phone]   FOR REFERENCE: CHEK2 mutations have been found to be associated with an increased risk of breast and other cancers. The estimated cancer risks vary widely and may be influenced by family history. Women with a CHEK2 deleterious mutation have approximately a 24% (no family history of breast cancer) to 48% (strong family history of breast cancer) lifetime risk of breast cancer and up to a 25% risk of a second breast cancer. Men may have an increased risk for female breast cancer of about 1%. Men and women may have an increased risk of colon cancer (~10% lifetime risk). According to the NCCN guidelines, individuals with CHEK2 mutations should consider breast MRI's as a part of regular breast cancer screening, and depending on family history could consider a risk-reducing mastectomy.  Breast cancer screening should begin, for women, at age 53 or 15 years younger than the earliest age of onset.  Colon cancer screening should begin at age 63 and continue every 5 years or based on polyp number

## 2017-01-23 ENCOUNTER — Ambulatory Visit (INDEPENDENT_AMBULATORY_CARE_PROVIDER_SITE_OTHER): Payer: Managed Care, Other (non HMO) | Admitting: Women's Health

## 2017-01-23 ENCOUNTER — Encounter: Payer: Self-pay | Admitting: Women's Health

## 2017-01-23 VITALS — BP 112/70 | Ht 65.0 in | Wt 141.0 lb

## 2017-01-23 DIAGNOSIS — Z23 Encounter for immunization: Secondary | ICD-10-CM | POA: Diagnosis not present

## 2017-01-23 DIAGNOSIS — Z01419 Encounter for gynecological examination (general) (routine) without abnormal findings: Secondary | ICD-10-CM | POA: Diagnosis not present

## 2017-01-23 NOTE — Patient Instructions (Signed)
Health Maintenance for Postmenopausal Women Menopause is a normal process in which your reproductive ability comes to an end. This process happens gradually over a span of months to years, usually between the ages of 94 and 74. Menopause is complete when you have missed 12 consecutive menstrual periods. It is important to talk with your health care provider about some of the most common conditions that affect postmenopausal women, such as heart disease, cancer, and bone loss (osteoporosis). Adopting a healthy lifestyle and getting preventive care can help to promote your health and wellness. Those actions can also lower your chances of developing some of these common conditions. What should I know about menopause? During menopause, you may experience a number of symptoms, such as:  Moderate-to-severe hot flashes.  Night sweats.  Decrease in sex drive.  Mood swings.  Headaches.  Tiredness.  Irritability.  Memory problems.  Insomnia.  Choosing to treat or not to treat menopausal changes is an individual decision that you make with your health care provider. What should I know about hormone replacement therapy and supplements? Hormone therapy products are effective for treating symptoms that are associated with menopause, such as hot flashes and night sweats. Hormone replacement carries certain risks, especially as you become older. If you are thinking about using estrogen or estrogen with progestin treatments, discuss the benefits and risks with your health care provider. What should I know about heart disease and stroke? Heart disease, heart attack, and stroke become more likely as you age. This may be due, in part, to the hormonal changes that your body experiences during menopause. These can affect how your body processes dietary fats, triglycerides, and cholesterol. Heart attack and stroke are both medical emergencies. There are many things that you can do to help prevent heart disease  and stroke:  Have your blood pressure checked at least every 1-2 years. High blood pressure causes heart disease and increases the risk of stroke.  If you are 6-76 years old, ask your health care provider if you should take aspirin to prevent a heart attack or a stroke.  Do not use any tobacco products, including cigarettes, chewing tobacco, or electronic cigarettes. If you need help quitting, ask your health care provider.  It is important to eat a healthy diet and maintain a healthy weight. ? Be sure to include plenty of vegetables, fruits, low-fat dairy products, and lean protein. ? Avoid eating foods that are high in solid fats, added sugars, or salt (sodium).  Get regular exercise. This is one of the most important things that you can do for your health. ? Try to exercise for at least 150 minutes each week. The type of exercise that you do should increase your heart rate and make you sweat. This is known as moderate-intensity exercise. ? Try to do strengthening exercises at least twice each week. Do these in addition to the moderate-intensity exercise.  Know your numbers.Ask your health care provider to check your cholesterol and your blood glucose. Continue to have your blood tested as directed by your health care provider.  What should I know about cancer screening? There are several types of cancer. Take the following steps to reduce your risk and to catch any cancer development as early as possible. Breast Cancer  Practice breast self-awareness. ? This means understanding how your breasts normally appear and feel. ? It also means doing regular breast self-exams. Let your health care provider know about any changes, no matter how small.  If you are 40  or older, have a clinician do a breast exam (clinical breast exam or CBE) every year. Depending on your age, family history, and medical history, it may be recommended that you also have a yearly breast X-ray (mammogram).  If you  have a family history of breast cancer, talk with your health care provider about genetic screening.  If you are at high risk for breast cancer, talk with your health care provider about having an MRI and a mammogram every year.  Breast cancer (BRCA) gene test is recommended for women who have family members with BRCA-related cancers. Results of the assessment will determine the need for genetic counseling and BRCA1 and for BRCA2 testing. BRCA-related cancers include these types: ? Breast. This occurs in males or females. ? Ovarian. ? Tubal. This may also be called fallopian tube cancer. ? Cancer of the abdominal or pelvic lining (peritoneal cancer). ? Prostate. ? Pancreatic.  Cervical, Uterine, and Ovarian Cancer Your health care provider may recommend that you be screened regularly for cancer of the pelvic organs. These include your ovaries, uterus, and vagina. This screening involves a pelvic exam, which includes checking for microscopic changes to the surface of your cervix (Pap test).  For women ages 21-65, health care providers may recommend a pelvic exam and a Pap test every three years. For women ages 79-65, they may recommend the Pap test and pelvic exam, combined with testing for human papilloma virus (HPV), every five years. Some types of HPV increase your risk of cervical cancer. Testing for HPV may also be done on women of any age who have unclear Pap test results.  Other health care providers may not recommend any screening for nonpregnant women who are considered low risk for pelvic cancer and have no symptoms. Ask your health care provider if a screening pelvic exam is right for you.  If you have had past treatment for cervical cancer or a condition that could lead to cancer, you need Pap tests and screening for cancer for at least 20 years after your treatment. If Pap tests have been discontinued for you, your risk factors (such as having a new sexual partner) need to be  reassessed to determine if you should start having screenings again. Some women have medical problems that increase the chance of getting cervical cancer. In these cases, your health care provider may recommend that you have screening and Pap tests more often.  If you have a family history of uterine cancer or ovarian cancer, talk with your health care provider about genetic screening.  If you have vaginal bleeding after reaching menopause, tell your health care provider.  There are currently no reliable tests available to screen for ovarian cancer.  Lung Cancer Lung cancer screening is recommended for adults 69-62 years old who are at high risk for lung cancer because of a history of smoking. A yearly low-dose CT scan of the lungs is recommended if you:  Currently smoke.  Have a history of at least 30 pack-years of smoking and you currently smoke or have quit within the past 15 years. A pack-year is smoking an average of one pack of cigarettes per day for one year.  Yearly screening should:  Continue until it has been 15 years since you quit.  Stop if you develop a health problem that would prevent you from having lung cancer treatment.  Colorectal Cancer  This type of cancer can be detected and can often be prevented.  Routine colorectal cancer screening usually begins at  age 2 and continues through age 42.  If you have risk factors for colon cancer, your health care provider may recommend that you be screened at an earlier age.  If you have a family history of colorectal cancer, talk with your health care provider about genetic screening.  Your health care provider may also recommend using home test kits to check for hidden blood in your stool.  A small camera at the end of a tube can be used to examine your colon directly (sigmoidoscopy or colonoscopy). This is done to check for the earliest forms of colorectal cancer.  Direct examination of the colon should be repeated every  5-10 years until age 40. However, if early forms of precancerous polyps or small growths are found or if you have a family history or genetic risk for colorectal cancer, you may need to be screened more often.  Skin Cancer  Check your skin from head to toe regularly.  Monitor any moles. Be sure to tell your health care provider: ? About any new moles or changes in moles, especially if there is a change in a mole's shape or color. ? If you have a mole that is larger than the size of a pencil eraser.  If any of your family members has a history of skin cancer, especially at a young age, talk with your health care provider about genetic screening.  Always use sunscreen. Apply sunscreen liberally and repeatedly throughout the day.  Whenever you are outside, protect yourself by wearing long sleeves, pants, a wide-brimmed hat, and sunglasses.  What should I know about osteoporosis? Osteoporosis is a condition in which bone destruction happens more quickly than new bone creation. After menopause, you may be at an increased risk for osteoporosis. To help prevent osteoporosis or the bone fractures that can happen because of osteoporosis, the following is recommended:  If you are 15-63 years old, get at least 1,000 mg of calcium and at least 600 mg of vitamin D per day.  If you are older than age 13 but younger than age 41, get at least 1,200 mg of calcium and at least 600 mg of vitamin D per day.  If you are older than age 30, get at least 1,200 mg of calcium and at least 800 mg of vitamin D per day.  Smoking and excessive alcohol intake increase the risk of osteoporosis. Eat foods that are rich in calcium and vitamin D, and do weight-bearing exercises several times each week as directed by your health care provider. What should I know about how menopause affects my mental health? Depression may occur at any age, but it is more common as you become older. Common symptoms of depression  include:  Low or sad mood.  Changes in sleep patterns.  Changes in appetite or eating patterns.  Feeling an overall lack of motivation or enjoyment of activities that you previously enjoyed.  Frequent crying spells.  Talk with your health care provider if you think that you are experiencing depression. What should I know about immunizations? It is important that you get and maintain your immunizations. These include:  Tetanus, diphtheria, and pertussis (Tdap) booster vaccine.  Influenza every year before the flu season begins.  Pneumonia vaccine.  Shingles vaccine.  Your health care provider may also recommend other immunizations. This information is not intended to replace advice given to you by your health care provider. Make sure you discuss any questions you have with your health care provider. Document Released: 09/15/2005  Document Revised: 02/11/2016 Document Reviewed: 04/27/2015 Elsevier Interactive Patient Education  2018 [employer]  

## 2017-01-23 NOTE — Progress Notes (Signed)
Tina Nielsen 01-02-1951 [id number]    History:    Presents for annual exam.  Postmenopausal on no HRT. 10/2016  spotting, negative sonohysterogram with negative biopsy 10/2016, endometrium 9.3, no bleeding since. She is on tamoxifen for one year. Right breast cancer 09/2015 DCIS positive ER/PR,  negative HER-2. Mother breast cancer twice died at age 67. Normal Pap history. 2014 negative colonoscopy. Had a DEXA yesterday. Primary care manages labs.  Past medical history, past surgical history, family history and social history were all reviewed and documented in the EPIC chart. 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  2 sons.  ROS:  A ROS was performed and pertinent positives and negatives are included.  Exam:  Vitals:   01/23/17 1617  BP: 112/70  Weight: 141 lb (64 kg)  Height: 5' 5" (1.651 m)   Body mass index is 23.46 kg/m.   General appearance:  Normal Thyroid:  Symmetrical, normal in size, without palpable masses or nodularity. Respiratory  Auscultation:  Clear without wheezing or rhonchi Cardiovascular  Auscultation:  Regular rate, without rubs, murmurs or gallops  Edema/varicosities:  Not grossly evident Abdominal  Soft,nontender, without masses, guarding or rebound.  Liver/spleen:  No organomegaly noted  Hernia:  None appreciated  Skin  Inspection:  Grossly normal   Breasts: Examined lying and sitting.     Right: Without masses, retractions, discharge or axillary adenopathy.     Left: Without masses, retractions, discharge or axillary adenopathy. Gentitourinary   Inguinal/mons:  Normal without inguinal adenopathy  External genitalia:  Normal  BUS/Urethra/Skene's glands:  Normal  Vagina:  Normal  Cervix:  Normal  Uterus:   normal in size, shape and contour.  Midline and mobile  Adnexa/parametria:     Rt: Without masses or tenderness.   Lt: Without masses or tenderness.  Anus and perineum: Normal  Digital rectal exam: Normal sphincter tone without palpated masses or  tenderness  Assessment/Plan:  66 y.o. MWF G2P2 for annual exam with no complaints.  Postmenopausal/no bleeding/no HRT 09/2015 right breast cancer on tamoxifen Dr. Jana Nielsen Primary care-labs  Plan: Instructed to call if any further bleeding or spotting. SBE's, continue annual screening mammogram, calcium rich diet, vitamin D 2000 daily. Vaccines as gust, T dap today, will get the shingles and pneumonia vaccine at primary care. Home safety, fall prevention and importance of weightbearing exercise reviewed. Will call when DEXA results available. Pap with HR HPV typing per request. Reviewed will no longer need further Pap screening if normal.    Tina Nielsen [facility], 5:01 PM 01/23/2017

## 2017-01-23 NOTE — Addendum Note (Signed)
Addended by: Thurnell Garbe A on: 01/23/2017 05:18 PM   Modules accepted: Orders

## 2017-01-24 LAB — URINALYSIS W MICROSCOPIC + REFLEX CULTURE
BILIRUBIN URINE: NEGATIVE
Casts: NONE SEEN [LPF]
Crystals: NONE SEEN [HPF]
GLUCOSE, UA: NEGATIVE
Hgb urine dipstick: NEGATIVE
KETONES UR: NEGATIVE
LEUKOCYTES UA: NEGATIVE
Nitrite: NEGATIVE
Protein, ur: NEGATIVE
SPECIFIC GRAVITY, URINE: 1.01 (ref 1.001–1.035)
Yeast: NONE SEEN [HPF]
pH: 5.5 (ref 5.0–8.0)

## 2017-01-24 NOTE — Addendum Note (Signed)
Addended by: Burnett Kanaris on: 01/24/2017 08:03 AM   Modules accepted: Orders

## 2017-01-25 LAB — URINE CULTURE: Organism ID, Bacteria: NO GROWTH

## 2017-01-26 LAB — PAP, TP IMAGING W/ HPV RNA, RFLX HPV TYPE 16,18/45: HPV mRNA, High Risk: NOT DETECTED

## 2017-02-12 ENCOUNTER — Encounter: Payer: Self-pay | Admitting: Women's Health

## 2017-05-20 ENCOUNTER — Other Ambulatory Visit: Payer: Self-pay | Admitting: Oncology

## 2017-06-07 ENCOUNTER — Ambulatory Visit: Payer: Managed Care, Other (non HMO) | Admitting: Sports Medicine

## 2017-09-06 ENCOUNTER — Telehealth: Payer: Self-pay | Admitting: Oncology

## 2017-09-12 ENCOUNTER — Other Ambulatory Visit: Payer: Managed Care, Other (non HMO)

## 2017-09-12 ENCOUNTER — Ambulatory Visit: Payer: Managed Care, Other (non HMO) | Admitting: Oncology

## 2017-09-12 NOTE — Progress Notes (Signed)
[facility]  [phone] Fax:([phone]) [phone]     ID: Tina Nielsen DOB: 24-Jan-1951  MR#: [id number]  XTK#:[id number]  Patient Care Team: Maurice Small, MD as PCP - General (Family Medicine) Emily Massar, Virgie Dad, MD as Consulting Physician (Oncology) Rolm Bookbinder, MD as Consulting Physician (General Surgery) Thea Silversmith, MD (Inactive) as Consulting Physician (Radiation Oncology) Christene Slates, MD as Physician Assistant (Radiology) Fontaine, Belinda Block, MD as Consulting Physician (Gynecology) OTHER MD:  CHIEF COMPLAINT: Estrogen receptor positive breast cancer  CURRENT TREATMENT: Tamoxifen   BREAST CANCER HISTORY:  From the original intake note:  "Tina Nielsen" noted a possible lump in her left breast and was referred for diagnostic mammography with tomosynthesis at [facility] 08/17/2015. The breast composition was category D. There was no finding in the left breast to correlate with the patient's lower inner quadrant concern. Ultrasound was obtained the same day and was likewise unremarkable.   However in the right breast upper outer quadrant posteriorly there was a 4 mm area of grouped calcifications. This was felt to warrant biopsy, which was performed stereotactically 09/01/2015. The final pathology ([id number]) showed an invasive ductal carcinoma, grade 1, estrogen receptor 100% positive, progesterone receptor 90% positive, both with strong staining intensity, with an MIB-1 of 10%, and no HER-2 amplification, the signals ratio being 1.17 and the number per cell 2.35.  Bilateral breast MRI was obtained 09/06/2015. This describes the breast density as category C. In the upper outer quadrant of the right breast there was a clip. There was no suspicious surrounding enhancement. More laterally there was a 5.1 cm fluid collection compatible with a hematoma. The left breast was normal and there was no abnormal appearing adenopathy.   Her subsequent history is as  detailed below.  INTERVAL HISTORY: Tina Nielsen returns today for follow-up of her estrogen receptor positive breast cancer. She continues on tamoxifen, with good tolerance. She denies hot flashes. She notes that she has an increase in vaginal discharge, which she wears a pad during the day.  Mammography at [facility] 08/14/2017 shows the breast density to be category D.  There was no evidence of malignancy.  She completed a bone density scan on 01/22/2017 which showed a T score of -2.0 osteopenia. Prior results (05/26/2014, on a different machine, through [facility]) showed a T score of -1.9   REVIEW OF SYSTEMS: Tina Nielsen reports that February is a stressful month for her. 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 She notes that since she is so busy, she only walks about 4 miles per week, but she doesn't have a step counter to track it. She notes that she arthritis in her knees and is unable to walk on a treadmill. She denies unusual headaches, visual changes, nausea, vomiting, or dizziness. There has been no unusual cough, phlegm production, or pleurisy. This been no change in bowel or bladder habits. She denies unexplained fatigue or unexplained weight loss, bleeding, rash, or fever. A detailed review of systems was otherwise stable.    PAST MEDICAL HISTORY: Past Medical History:  Diagnosis Date  . Anxiety   . Arthritis    knees  . Atrophic vaginitis   . Breast cancer of upper-outer quadrant of right female breast ([place]) 09/03/2015  . Family history of breast cancer   . High cholesterol   . Osteopenia     PAST SURGICAL HISTORY: Past Surgical History:  Procedure Laterality Date  . BREAST LUMPECTOMY WITH NEEDLE LOCALIZATION  AND AXILLARY SENTINEL LYMPH NODE BX Right 09/30/2015   Procedure: RIGHT BREAST LUMPECTOMY WITH NEEDLE LOCALIZATION AND AXILLARY SENTINEL LYMPH NODE BX;  Surgeon: Rolm Bookbinder, MD;  Location: [facility];   Service: General;  Laterality: Right;  . BREAST SURGERY     BIOPSY  . KNEE SURGERY    . TUBAL LIGATION      FAMILY HISTORY Family History  Problem Relation Age of Onset  . Hypertension Mother   . Breast cancer Mother 63       second cancer at 26  . Heart disease Mother   . Aneurysm Father        AORTIC  . Heart disease Maternal Grandfather   . Kidney cancer Maternal Aunt 28  . Prostate cancer Paternal Uncle 10  . Stroke Maternal Grandmother   . Breast cancer Other        MGMs sister  . Breast cancer Other        PGMs sister  . Breast cancer Other        PGF sister  . Colon cancer Neg Hx    the patient's father died from a ruptured aortic aneurysm the age of 48. The patient's mother was diagnosed with breast cancer at age 27. She had a second breast cancer diagnosed at age 87.She died at age 57. The patient had one brother, no sisters. As far as she is aware that there are no other breast or ovarian cancer instances in the family  GYNECOLOGIC HISTORY:  No LMP recorded. Patient is postmenopausal. Menarche age 5, first live birth age 55, which the patient knows increases the risk of breast cancer. She is GX P2. She stopped having periods around age 63. She did not take hormone replacement. 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 Her husband [name].history as well as coached basketball at the same school. He is now retired but is very active in the [employer]. He knows [name] well and has quite a few stories to tell regarding [facility] basketball. Their son [name] lives in [place] and works in Designer, television/film set as a Environmental manager. Son [name] lives in [place] working as a Designer, jewellery. The children have been made aware of the CHEK2 mutation and are considering testing.The patient has no grandchildren.    ADVANCED DIRECTIVES: In  place   HEALTH MAINTENANCE: Social History   Tobacco Use  . Smoking status: Former Smoker    Last attempt to quit: 08/07/1976    Years since quitting: 41.1  . Smokeless tobacco: Never Used  Substance Use Topics  . Alcohol use: Yes    Alcohol/week: 4.2 oz    Types: 7 Glasses of wine per week  . Drug use: No     Colonoscopy: 2015/[facility]  PAP:  Bone density: June 2018 with a t score of -2.0 osteopenia  Lipid panel:  Allergies  Allergen Reactions  . Aleve [Naproxen] Other (See Comments)    "stomach irritation"  . Celebrex [Celecoxib]     Irritated stomach- caused burning  . Ibuprofen     "UPSET STOMACH"- burning    Current Outpatient Medications  Medication Sig Dispense Refill  . acetaminophen (TYLENOL) 500 MG tablet Take 500 mg by mouth every 6 (six) hours as needed.    . Calcium Carbonate-Vitamin D (CALCIUM + D PO) Take by mouth.      . escitalopram (LEXAPRO)  10 MG tablet take 1 tablet by mouth once daily 30 tablet 12  . Glucosamine-Chondroitin (GLUCOSAMINE CHONDR COMPLEX PO) Take by mouth.      . hyaluronate sodium (RADIAPLEXRX) GEL Apply 1 application topically 2 (two) times daily. Reported on 02/03/2016    . mometasone (NASONEX) 50 MCG/ACT nasal spray Place 2 sprays into the nose daily.    . Multiple Vitamin (MULTIVITAMIN) capsule Take 1 capsule by mouth daily.      . pravastatin (PRAVACHOL) 40 MG tablet   0  . tamoxifen (NOLVADEX) 20 MG tablet TAKE 1 TABLET BY MOUTH EVERY DAY 145 tablet 0  . VITAMIN E PO Take by mouth.       No current facility-administered medications for this visit.     OBJECTIVE: Middle-aged white woman in no acute distress  Vitals:   09/17/17 0842  BP: (!) 142/50  Pulse: 64  Resp: 18  Temp: 98.7 F (37.1 C)  SpO2: 99%     Body mass index is 24.16 kg/m.    ECOG FS:0 - Asymptomatic  Sclerae unicteric, EOMs intact Oropharynx clear and moist No cervical or supraclavicular adenopathy Lungs no rales or rhonchi Heart regular rate and  rhythm Abd soft, nontender, positive bowel sounds MSK no focal spinal tenderness, no upper extremity lymphedema Neuro: nonfocal, well oriented, appropriate affect Breasts: The right breast is status post lumpectomy and radiation.  There is no evidence of local recurrence.  The left breast is benign.  Both axilla are benign.  LAB RESULTS:  CMP     Component Value Date/Time   NA 140 12/07/2016 1504   K 3.8 12/07/2016 1504   CL 106 09/27/2015 0850   CO2 28 12/07/2016 1504   GLUCOSE 99 12/07/2016 1504   BUN 16.3 12/07/2016 1504   CREATININE 0.8 12/07/2016 1504   CALCIUM 8.9 12/07/2016 1504   PROT 6.5 12/07/2016 1504   ALBUMIN 3.6 12/07/2016 1504   AST 21 12/07/2016 1504   ALT 18 12/07/2016 1504   ALKPHOS 50 12/07/2016 1504   BILITOT 0.61 12/07/2016 1504   GFRNONAA >60 09/27/2015 0850   GFRAA >60 09/27/2015 0850    INo results found for: SPEP, UPEP  Lab Results  Component Value Date   WBC 5.2 09/17/2017   NEUTROABS 3.4 09/17/2017   HGB 13.0 09/17/2017   HCT 40.4 09/17/2017   MCV 100.5 09/17/2017   PLT 174 09/17/2017      Chemistry      Component Value Date/Time   NA 140 12/07/2016 1504   K 3.8 12/07/2016 1504   CL 106 09/27/2015 0850   CO2 28 12/07/2016 1504   BUN 16.3 12/07/2016 1504   CREATININE 0.8 12/07/2016 1504      Component Value Date/Time   CALCIUM 8.9 12/07/2016 1504   ALKPHOS 50 12/07/2016 1504   AST 21 12/07/2016 1504   ALT 18 12/07/2016 1504   BILITOT 0.61 12/07/2016 1504       No results found for: LABCA2  No components found for: LABCA125  No results for input(s): INR in the last 168 hours.  Urinalysis    Component Value Date/Time   COLORURINE YELLOW 01/23/2017 1628   APPEARANCEUR CLEAR 01/23/2017 1628   LABSPEC 1.010 01/23/2017 1628   PHURINE 5.5 01/23/2017 1628   GLUCOSEU NEGATIVE 01/23/2017 1628   HGBUR NEGATIVE 01/23/2017 1628   BILIRUBINUR NEGATIVE 01/23/2017 1628   KETONESUR NEGATIVE 01/23/2017 1628   PROTEINUR NEGATIVE  01/23/2017 1628   UROBILINOGEN 0.2 02/10/2014 0846   NITRITE NEGATIVE 01/23/2017  [place] 01/23/2017 1628      ELIGIBLE FOR AVAILABLE RESEARCH PROTOCOL: no  STUDIES: Mammography at [facility] January 2019 found the breast density to be category D.  There was no evidence of malignancy.  She completed a bone density scan on 01/22/2017 which showed a T score of -2.0 osteopenic   ASSESSMENT: 67 y.o. [place] woman status post right breast upper outer quadrant biopsy 09/01/2015 for a clinical T1a N0, stage IA invasive ductal carcinoma, grade 1, estrogen and progesterone receptor positive, HER-2 not amplified, with an MIB-1 of 10%  (1) genetics testing 10/19/2015 through the Breast/Ovarian gene panel offered by [facility] found no deleterious mutations in ATM, BARD1, BRCA1, BRCA2, BRIP1, CDH1, EPCAM, FANCC, MLH1, MSH2, MSH6, NBN, PALB2, PMS2, PTEN, RAD51C, RAD51D, TP53, and XRCC2.  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   (b) Genetic testing also detected a Variant of Unknown Significance in the BRCA2 gene called [id number].  (c) breast cancer intensified screening will consist of breast MRI in July and breast mammography in January yearly  (d) colon cancer screening will consist of colonoscopy every 5 years or more often depending on findings   (i) most recent colonoscopy 07/28/2013; no polyps noted  (2) status post right lumpectomy with sentinel lymph node sampling 09/30/2015 for a pTis pN0, stage 0 ductal carcinoma in situ, grade 2, with negative margins  (3) adjuvant radiation completed May 2017  (4) started tamoxifen 01/06/2016.[facility]cm[facility]  (a) endometrial biopsy 10/11/2016 shows scant tissue with an atrophic pattern.  PLAN: Tina Nielsen is now 2 years out from definitive surgery for her breast cancer with no evidence of disease recurrence.  This is very favorable.  She is tolerating tamoxifen well and that is helping bone  density issues.  She understands she has moderate osteopenia.  She is already on calcium and vitamin D and from her report walks about 8000 steps most days.  She does have some vaginal discharge issues.  She also continues to have very dense breasts.  I think it might be a prudent move next year to switch to anastrozole.  That might cause her breasts to become more fatty replaced sooner.  It also would take care of the discharge issue although of course they would substituted with vaginal dryness problems.  Given her very dense breasts, and her check to mutation, it would be important for her to have an MRI of the breast 6 months from her mammogram and I have put that order in for her  If we do change to anastrozole next year as I anticipate she will probably start on Boniva at the same time  She knows to call for any issues that may develop before then.   Roseanna Koplin, Virgie Dad, MD  09/17/17 9:12 AM Medical Oncology and Hematology [facility] [address] [place], [place] [address] Tel. [phone]    Fax. [phone]  This document serves as a record of services personally performed by Lurline Del, MD. It was created on his behalf by Sheron Nightingale, a trained medical scribe. The creation of this record is based on the scribe's personal observations and the provider's statements to them.   I have reviewed the above documentation for accuracy and completeness, and I agree with the above.      FOR REFERENCE: CHEK2 mutations have been found to be associated with an increased risk of breast and other cancers. The estimated cancer risks vary widely and may be influenced by  family history. Women with a CHEK2 deleterious mutation have approximately a 24% (no family history of breast cancer) to 48% (strong family history of breast cancer) lifetime risk of breast cancer and up to a 25% risk of a second breast cancer. Men may have an increased risk for female breast cancer of  about 1%. Men and women may have an increased risk of colon cancer (~10% lifetime risk). According to the NCCN guidelines, individuals with CHEK2 mutations should consider breast MRI's as a part of regular breast cancer screening, and depending on family history could consider a risk-reducing mastectomy.  Breast cancer screening should begin, for women, at age 59 or 20 years younger than the earliest age of onset.  Colon cancer screening should begin at age 15 and continue every 5 years or based on polyp number

## 2017-09-13 ENCOUNTER — Other Ambulatory Visit: Payer: Managed Care, Other (non HMO)

## 2017-09-13 ENCOUNTER — Ambulatory Visit: Payer: Managed Care, Other (non HMO) | Admitting: Oncology

## 2017-09-17 ENCOUNTER — Inpatient Hospital Stay: Payer: Managed Care, Other (non HMO)

## 2017-09-17 ENCOUNTER — Inpatient Hospital Stay: Payer: Managed Care, Other (non HMO) | Attending: Oncology | Admitting: Oncology

## 2017-09-17 VITALS — BP 142/50 | HR 64 | Temp 98.7°F | Resp 18 | Ht 65.0 in | Wt 145.2 lb

## 2017-09-17 DIAGNOSIS — E7800 Pure hypercholesterolemia, unspecified: Secondary | ICD-10-CM | POA: Diagnosis not present

## 2017-09-17 DIAGNOSIS — Z8051 Family history of malignant neoplasm of kidney: Secondary | ICD-10-CM | POA: Insufficient documentation

## 2017-09-17 DIAGNOSIS — N898 Other specified noninflammatory disorders of vagina: Secondary | ICD-10-CM | POA: Diagnosis not present

## 2017-09-17 DIAGNOSIS — M8580 Other specified disorders of bone density and structure, unspecified site: Secondary | ICD-10-CM | POA: Insufficient documentation

## 2017-09-17 DIAGNOSIS — Z1589 Genetic susceptibility to other disease: Secondary | ICD-10-CM

## 2017-09-17 DIAGNOSIS — Z1502 Genetic susceptibility to malignant neoplasm of ovary: Secondary | ICD-10-CM

## 2017-09-17 DIAGNOSIS — C50919 Malignant neoplasm of unspecified site of unspecified female breast: Secondary | ICD-10-CM

## 2017-09-17 DIAGNOSIS — Z79810 Long term (current) use of selective estrogen receptor modulators (SERMs): Secondary | ICD-10-CM | POA: Diagnosis not present

## 2017-09-17 DIAGNOSIS — Z8042 Family history of malignant neoplasm of prostate: Secondary | ICD-10-CM | POA: Diagnosis not present

## 2017-09-17 DIAGNOSIS — C50411 Malignant neoplasm of upper-outer quadrant of right female breast: Secondary | ICD-10-CM

## 2017-09-17 DIAGNOSIS — Z803 Family history of malignant neoplasm of breast: Secondary | ICD-10-CM

## 2017-09-17 DIAGNOSIS — D0511 Intraductal carcinoma in situ of right breast: Secondary | ICD-10-CM | POA: Insufficient documentation

## 2017-09-17 DIAGNOSIS — Z780 Asymptomatic menopausal state: Secondary | ICD-10-CM | POA: Diagnosis not present

## 2017-09-17 DIAGNOSIS — M170 Bilateral primary osteoarthritis of knee: Secondary | ICD-10-CM | POA: Diagnosis not present

## 2017-09-17 DIAGNOSIS — Z923 Personal history of irradiation: Secondary | ICD-10-CM | POA: Diagnosis not present

## 2017-09-17 DIAGNOSIS — Z170 Estrogen receptor positive status [ER+]: Secondary | ICD-10-CM | POA: Diagnosis not present

## 2017-09-17 DIAGNOSIS — Z79899 Other long term (current) drug therapy: Secondary | ICD-10-CM

## 2017-09-17 DIAGNOSIS — Z1509 Genetic susceptibility to other malignant neoplasm: Secondary | ICD-10-CM

## 2017-09-17 DIAGNOSIS — F419 Anxiety disorder, unspecified: Secondary | ICD-10-CM | POA: Diagnosis not present

## 2017-09-17 DIAGNOSIS — Z87891 Personal history of nicotine dependence: Secondary | ICD-10-CM | POA: Diagnosis not present

## 2017-09-17 LAB — CBC WITH DIFFERENTIAL/PLATELET
Basophils Absolute: 0 10*3/uL (ref 0.0–0.1)
Basophils Relative: 0 %
EOS PCT: 2 %
Eosinophils Absolute: 0.1 10*3/uL (ref 0.0–0.5)
HEMATOCRIT: 40.4 % (ref 34.8–46.6)
Hemoglobin: 13 g/dL (ref 11.6–15.9)
LYMPHS ABS: 1.5 10*3/uL (ref 0.9–3.3)
Lymphocytes Relative: 29 %
MCH: 32.3 pg (ref 25.1–34.0)
MCHC: 32.2 g/dL (ref 31.5–36.0)
MCV: 100.5 fL (ref 79.5–101.0)
MONO ABS: 0.3 10*3/uL (ref 0.1–0.9)
Monocytes Relative: 5 %
NEUTROS ABS: 3.4 10*3/uL (ref 1.5–6.5)
Neutrophils Relative %: 64 %
PLATELETS: 174 10*3/uL (ref 145–400)
RBC: 4.02 MIL/uL (ref 3.70–5.45)
RDW: 12.8 % (ref 11.2–14.5)
WBC: 5.2 10*3/uL (ref 3.9–10.3)

## 2017-09-17 LAB — COMPREHENSIVE METABOLIC PANEL
ALBUMIN: 3.7 g/dL (ref 3.5–5.0)
ALT: 18 U/L (ref 0–55)
AST: 20 U/L (ref 5–34)
Alkaline Phosphatase: 50 U/L (ref 40–150)
Anion gap: 7 (ref 3–11)
BUN: 13 mg/dL (ref 7–26)
CHLORIDE: 107 mmol/L (ref 98–109)
CO2: 28 mmol/L (ref 22–29)
Calcium: 8.6 mg/dL (ref 8.4–10.4)
Creatinine, Ser: 0.93 mg/dL (ref 0.60–1.10)
GFR calc Af Amer: >60 mL/min (ref >60)
GFR calc non Af Amer: >60 mL/min (ref >60)
GLUCOSE: 85 mg/dL (ref 70–140)
Potassium: 4.2 mmol/L (ref 3.5–5.1)
SODIUM: 142 mmol/L (ref 136–145)
Total Bilirubin: 0.5 mg/dL (ref 0.2–1.2)
Total Protein: 6.6 g/dL (ref 6.4–8.3)

## 2017-09-17 MED ORDER — TAMOXIFEN CITRATE 20 MG PO TABS: 20.0000 mg | ORAL_TABLET | Freq: Every day | ORAL | 4 refills | Status: DC

## 2017-10-03 ENCOUNTER — Other Ambulatory Visit: Payer: Self-pay | Admitting: Oncology

## 2017-10-08 ENCOUNTER — Other Ambulatory Visit: Payer: Self-pay | Admitting: Oncology

## 2018-01-22 ENCOUNTER — Telehealth: Payer: Self-pay | Admitting: Oncology

## 2018-01-22 NOTE — Telephone Encounter (Signed)
Patient called in to reschedule  °

## 2018-02-09 ENCOUNTER — Other Ambulatory Visit: Payer: Self-pay | Admitting: Oncology

## 2018-03-11 ENCOUNTER — Other Ambulatory Visit: Payer: Managed Care, Other (non HMO)

## 2018-03-11 ENCOUNTER — Ambulatory Visit
Admission: RE | Admit: 2018-03-11 | Discharge: 2018-03-11 | Disposition: A | Discharge status: Home / Self Care | Payer: Managed Care, Other (non HMO) | Source: Ambulatory Visit | Attending: Oncology | Admitting: Oncology

## 2018-03-11 DIAGNOSIS — Z170 Estrogen receptor positive status [ER+]: Secondary | ICD-10-CM

## 2018-03-11 DIAGNOSIS — C50411 Malignant neoplasm of upper-outer quadrant of right female breast: Secondary | ICD-10-CM

## 2018-03-11 DIAGNOSIS — Z1589 Genetic susceptibility to other disease: Secondary | ICD-10-CM

## 2018-03-11 DIAGNOSIS — Z1509 Genetic susceptibility to other malignant neoplasm: Secondary | ICD-10-CM

## 2018-03-11 DIAGNOSIS — Z1502 Genetic susceptibility to malignant neoplasm of ovary: Secondary | ICD-10-CM

## 2018-03-11 DIAGNOSIS — D0511 Intraductal carcinoma in situ of right breast: Secondary | ICD-10-CM

## 2018-03-11 DIAGNOSIS — C50919 Malignant neoplasm of unspecified site of unspecified female breast: Secondary | ICD-10-CM

## 2018-03-11 MED ORDER — GADOBENATE DIMEGLUMINE 529 MG/ML IV SOLN
13.0000 mL | Freq: Once | INTRAVENOUS | Status: AC | PRN
  Administered 2018-03-11: 13 mL via INTRAVENOUS

## 2018-03-12 NOTE — Progress Notes (Signed)
[name]  [phone] Fax:([phone]) [phone]     ID: Tina Nielsen DOB: 1950-08-29  MR#: [id number]  DDU#:[id number]  Patient Care Team: Maurice Small, MD as PCP - General (Family Medicine) Kimm Sider, Virgie Dad, MD as Consulting Physician (Oncology) Rolm Bookbinder, MD as Consulting Physician (General Surgery) Thea Silversmith, MD as Consulting Physician (Radiation Oncology) Christene Slates, MD as Physician Assistant (Radiology) Fontaine, Belinda Block, MD as Consulting Physician (Gynecology) OTHER MD:  CHIEF COMPLAINT: Estrogen receptor positive breast cancer  CURRENT TREATMENT: Tamoxifen   BREAST CANCER HISTORY:  From the original intake note:  "Tina Nielsen" noted a possible lump in her left breast and was referred for diagnostic mammography with tomosynthesis at [facility] 08/17/2015. The breast composition was category D. There was no finding in the left breast to correlate with the patient's lower inner quadrant concern. Ultrasound was obtained the same day and was likewise unremarkable.   However in the right breast upper outer quadrant posteriorly there was a 4 mm area of grouped calcifications. This was felt to warrant biopsy, which was performed stereotactically 09/01/2015. The final pathology ([id number]) showed an invasive ductal carcinoma, grade 1, estrogen receptor 100% positive, progesterone receptor 90% positive, both with strong staining intensity, with an MIB-1 of 10%, and no HER-2 amplification, the signals ratio being 1.17 and the number per cell 2.35.  Bilateral breast MRI was obtained 09/06/2015. This describes the breast density as category C. In the upper outer quadrant of the right breast there was a clip. There was no suspicious surrounding enhancement. More laterally there was a 5.1 cm fluid collection compatible with a hematoma. The left breast was normal and there was no abnormal appearing adenopathy.   Her subsequent history is as detailed  below.  INTERVAL HISTORY: Tina Nielsen returns today for follow-up of her estrogen receptor positive breast cancer. She continues on tamoxifen, with good tolerance. She denies issues with hot flashes. She has increased vaginal discharge.   She completed a bilateral breast MRI on 03/11/2018 showing breast density category D. There was no MRI evidence of malignancy.   REVIEW OF SYSTEMS: Tina Nielsen has arthritis in her right knee. She walks and wears knee compression sleeves which helps. She has not taken physical therapy, but she was given cortisone shots under Dr. Alvan Dame. One of her children got married in April. She denies unusual headaches, visual changes, nausea, vomiting, or dizziness. There has been no unusual cough, phlegm production, or pleurisy. This been no change in bowel or bladder habits. She denies unexplained fatigue or unexplained weight loss, bleeding, rash, or fever. A detailed review of systems was otherwise stable.    PAST MEDICAL HISTORY: Past Medical History:  Diagnosis Date  . Anxiety   . Arthritis    knees  . Atrophic vaginitis   . Breast cancer of upper-outer quadrant of right female breast ([place]) 09/03/2015  . Family history of breast cancer   . High cholesterol   . Osteopenia     PAST SURGICAL HISTORY: Past Surgical History:  Procedure Laterality Date  . BREAST LUMPECTOMY WITH NEEDLE LOCALIZATION AND AXILLARY SENTINEL LYMPH NODE BX Right 09/30/2015   Procedure: RIGHT BREAST LUMPECTOMY WITH NEEDLE LOCALIZATION AND AXILLARY SENTINEL LYMPH NODE BX;  Surgeon: Rolm Bookbinder, MD;  Location: [place];  Service: General;  Laterality: Right;  . BREAST SURGERY     BIOPSY  . KNEE SURGERY    . TUBAL LIGATION      FAMILY HISTORY Family History  Problem Relation Age of Onset  .  Hypertension Mother   . Breast cancer Mother 30       second cancer at 52  . Heart disease Mother   . Aneurysm Father        AORTIC  . Heart disease Maternal Grandfather   . Kidney  cancer Maternal Aunt 40  . Prostate cancer Paternal Uncle 29  . Stroke Maternal Grandmother   . Breast cancer Other        MGMs sister  . Breast cancer Other        PGMs sister  . Breast cancer Other        PGF sister  . Colon cancer Neg Hx    the patient's father died from a ruptured aortic aneurysm the age of 24. The patient's mother was diagnosed with breast cancer at age 23. She had a second breast cancer diagnosed at age 75.She died at age 31. The patient had one brother, no sisters. As far as she is aware that there are no other breast or ovarian cancer instances in the family  GYNECOLOGIC HISTORY:  No LMP recorded. Patient is postmenopausal. Menarche age 38, first live birth age 53, which the patient knows increases the risk of breast cancer. She is GX P2. She stopped having periods around age 62. She did not take hormone replacement. 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 Her husband [name] taught history as well as coached basketball at the same school. He is now retired but is very active in the [employer]. He knows [name] well and has quite a few stories to tell regarding [facility] basketball. Their son [name] lives in [place] and works in Designer, television/film set as a Environmental manager. Son [name] lives in [place] working as a Designer, jewellery. The children have been made aware of the CHEK2 mutation and are considering testing.The patient has no grandchildren.    ADVANCED DIRECTIVES: In place   HEALTH MAINTENANCE: Social History   Tobacco Use  . Smoking status: Former Smoker    Last attempt to quit: 08/07/1976    Years since quitting: 41.6  . Smokeless tobacco: Never Used  Substance Use Topics  . Alcohol use: Yes    Alcohol/week: 4.2 oz    Types: 7 Glasses of wine per week  . Drug use: No     Colonoscopy: 2015/[facility]  PAP:  Bone  density: June 2018 with a t score of -2.0 osteopenia  Lipid panel:  Allergies  Allergen Reactions  . Aleve [Naproxen] Other (See Comments)    "stomach irritation"  . Celebrex [Celecoxib]     Irritated stomach- caused burning  . Ibuprofen     "UPSET STOMACH"- burning    Current Outpatient Medications  Medication Sig Dispense Refill  . acetaminophen (TYLENOL) 500 MG tablet Take 500 mg by mouth every 6 (six) hours as needed.    . Calcium Carbonate-Vitamin D (CALCIUM + D PO) Take by mouth.      . escitalopram (LEXAPRO) 10 MG tablet take 1 tablet by mouth once daily 30 tablet 12  . Glucosamine-Chondroitin (GLUCOSAMINE CHONDR COMPLEX PO) Take by mouth.      . mometasone (NASONEX) 50 MCG/ACT nasal spray Place 2 sprays into the nose daily.    . Multiple Vitamin (MULTIVITAMIN) capsule Take 1 capsule by mouth daily.      . pravastatin (PRAVACHOL) 40 MG tablet   0  . tamoxifen (NOLVADEX)  20 MG tablet Take 1 tablet (20 mg total) by mouth daily. 90 tablet 4  . tamoxifen (NOLVADEX) 20 MG tablet TAKE 1 TABLET BY MOUTH EVERY DAY 145 tablet 0  . VITAMIN E PO Take by mouth.       No current facility-administered medications for this visit.     OBJECTIVE: Middle-aged white woman who appears well  Vitals:   03/13/18 0934  BP: (!) 111/56  Pulse: 65  Resp: 18  Temp: 98.4 F (36.9 C)  SpO2: 100%     Body mass index is 23.86 kg/m.    ECOG FS:1 - Symptomatic but completely ambulatory  Sclerae unicteric, pupils round and equal No cervical or supraclavicular adenopathy Lungs no rales or rhonchi Heart regular rate and rhythm Abd soft, nontender, positive bowel sounds MSK no focal spinal tenderness, no upper extremity lymphedema Neuro: nonfocal, well oriented, appropriate affect Breasts: Right breast has undergone lumpectomy followed by radiation.  The left breast is benign.  Both axillae are benign.  LAB RESULTS:  CMP     Component Value Date/Time   NA 142 09/17/2017 0811   NA 140  12/07/2016 1504   K 4.2 09/17/2017 0811   K 3.8 12/07/2016 1504   CL 107 09/17/2017 0811   CO2 28 09/17/2017 0811   CO2 28 12/07/2016 1504   GLUCOSE 85 09/17/2017 0811   GLUCOSE 99 12/07/2016 1504   BUN 13 09/17/2017 0811   BUN 16.3 12/07/2016 1504   CREATININE 0.93 09/17/2017 0811   CREATININE 0.8 12/07/2016 1504   CALCIUM 8.6 09/17/2017 0811   CALCIUM 8.9 12/07/2016 1504   PROT 6.6 09/17/2017 0811   PROT 6.5 12/07/2016 1504   ALBUMIN 3.7 09/17/2017 0811   ALBUMIN 3.6 12/07/2016 1504   AST 20 09/17/2017 0811   AST 21 12/07/2016 1504   ALT 18 09/17/2017 0811   ALT 18 12/07/2016 1504   ALKPHOS 50 09/17/2017 0811   ALKPHOS 50 12/07/2016 1504   BILITOT 0.5 09/17/2017 0811   BILITOT 0.61 12/07/2016 1504   GFRNONAA >60 09/17/2017 0811   GFRAA >60 09/17/2017 0811    INo results found for: SPEP, UPEP  Lab Results  Component Value Date   WBC 4.1 03/13/2018   NEUTROABS 2.2 03/13/2018   HGB 12.5 03/13/2018   HCT 37.5 03/13/2018   MCV 97.5 03/13/2018   PLT 160 03/13/2018      Chemistry      Component Value Date/Time   NA 142 09/17/2017 0811   NA 140 12/07/2016 1504   K 4.2 09/17/2017 0811   K 3.8 12/07/2016 1504   CL 107 09/17/2017 0811   CO2 28 09/17/2017 0811   CO2 28 12/07/2016 1504   BUN 13 09/17/2017 0811   BUN 16.3 12/07/2016 1504   CREATININE 0.93 09/17/2017 0811   CREATININE 0.8 12/07/2016 1504      Component Value Date/Time   CALCIUM 8.6 09/17/2017 0811   CALCIUM 8.9 12/07/2016 1504   ALKPHOS 50 09/17/2017 0811   ALKPHOS 50 12/07/2016 1504   AST 20 09/17/2017 0811   AST 21 12/07/2016 1504   ALT 18 09/17/2017 0811   ALT 18 12/07/2016 1504   BILITOT 0.5 09/17/2017 0811   BILITOT 0.61 12/07/2016 1504       No results found for: LABCA2  No components found for: LABCA125  No results for input(s): INR in the last 168 hours.  Urinalysis    Component Value Date/Time   COLORURINE YELLOW 01/23/2017 1628   APPEARANCEUR CLEAR 01/23/2017  1628    LABSPEC 1.010 01/23/2017 1628   PHURINE 5.5 01/23/2017 1628   GLUCOSEU NEGATIVE 01/23/2017 1628   HGBUR NEGATIVE 01/23/2017 1628   BILIRUBINUR NEGATIVE 01/23/2017 1628   KETONESUR NEGATIVE 01/23/2017 1628   PROTEINUR NEGATIVE 01/23/2017 1628   UROBILINOGEN 0.2 02/10/2014 0846   NITRITE NEGATIVE 01/23/2017 1628   LEUKOCYTESUR NEGATIVE 01/23/2017 1628      ELIGIBLE FOR AVAILABLE RESEARCH PROTOCOL: no  STUDIES: Mammography at [facility] January 2019 found the breast density to be category D.  There was no evidence of malignancy.  Mr Breast Bilateral W Wo Contrast Inc Cad  Result Date: 03/12/2018 CLINICAL DATA:  67 year old female with high lifetime risk for developing breast cancer for bilateral screening breast MRI. Personal history of stage I RIGHT breast cancer with lumpectomy in 2017 and CHEK2 and VUS(BRCA2) genetic mutations. LABS:  Creatinine was obtained on site at [facility] at [address] Results: Creatinine-0.9.  BUN-12.  GFR-106. EXAM: BILATERAL BREAST MRI WITH AND WITHOUT CONTRAST TECHNIQUE: Multiplanar, multisequence MR images of both breasts were obtained prior to and following the intravenous administration of 13 ml of MultiHance. THREE-DIMENSIONAL MR IMAGE RENDERING ON INDEPENDENT WORKSTATION: Three-dimensional MR images were rendered by post-processing of the original MR data on an independent workstation. The three-dimensional MR images were interpreted, and findings are reported in the following complete MRI report for this study. Three dimensional images were evaluated at the independent DynaCad workstation COMPARISON:  09/06/2015 MR, 08/14/2017 mammograms and prior studies FINDINGS: Breast composition: d. Extreme fibroglandular tissue. Background parenchymal enhancement: Minimal Right breast: No mass or abnormal enhancement. Mild postsurgical changes in the UPPER-OUTER RIGHT breast noted. Left breast: No mass or abnormal enhancement. Lymph nodes: No abnormal  appearing lymph nodes. Ancillary findings: Mild heterogeneity of the sternum and subtle nonaggressive appearing focal enhancement within the upper left sternum is unchanged. IMPRESSION: No MR evidence of breast malignancy. RECOMMENDATION: Continued annual bilateral screening mammograms and annual screening breast MRI per schedule. BI-RADS CATEGORY  2: Benign. Electronically Signed   By: Margarette Canada M.D.   On: 03/12/2018 12:03     ASSESSMENT: 67 y.o. [place] woman status post right breast upper outer quadrant biopsy 09/01/2015 for a clinical T1a N0, stage IA invasive ductal carcinoma, grade 1, estrogen and progesterone receptor positive, HER-2 not amplified, with an MIB-1 of 10%  (1) genetics testing 10/19/2015 through the Breast/Ovarian gene panel offered by [facility] found no deleterious mutations in ATM, BARD1, BRCA1, BRCA2, BRIP1, CDH1, EPCAM, FANCC, MLH1, MSH2, MSH6, NBN, PALB2, PMS2, PTEN, RAD51C, RAD51D, TP53, and XRCC2.  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   (b) Genetic testing also detected a Variant of Unknown Significance in the BRCA2 gene called [id number].  (c) breast cancer intensified screening will consist of breast MRI in July and breast mammography in January yearly  (d) colon cancer screening will consist of colonoscopy every 5 years or more often depending on findings   (i) most recent colonoscopy 07/28/2013; no polyps noted  (2) status post right lumpectomy with sentinel lymph node sampling 09/30/2015 for a pTis pN0, stage 0 ductal carcinoma in situ, grade 2, with negative margins  (3) adjuvant radiation completed May 2017  (4) started tamoxifen 01/06/2016  (a) endometrial biopsy 10/11/2016 shows scant tissue with an atrophic pattern.  (5) bone density at [facility] 01/22/2017 shows a T score of -2.0  PLAN: Tina Nielsen is now 2-1/2 years out from definitive surgery for breast cancer with no evidence of disease  recurrence.   This is very favorable.  She is tolerating tamoxifen well and the plan is to continue that at least another year.  Today we discussed extensively the option of switching to anastrozole.  We discussed the possible benefit as well as the possible toxicity side effects and complications of this agent.  If she did switch to anastrozole I would urge her to consider denosumab/Prolia.  Of course she does have osteopenia and she could consider Prolia at this point in any case  After much discussion she decided what she really wanted to do was take tamoxifen for another year and consider switching to anastrozole next year.  I am comfortable with that.  She should have a colonoscopy sometime within the next year or so.  Her children have not yet been tested.  They are in their mid to late 76s.  We can continue to discuss that at the next visit  She knows to call for any other issues that may develop before then.  FOR REFERENCE: CHEK2 mutations have been found to be associated with an increased risk of breast and other cancers. The estimated cancer risks vary widely and may be influenced by family history. Women with a CHEK2 deleterious mutation have approximately a 24% (no family history of breast cancer) to 48% (strong family history of breast cancer) lifetime risk of breast cancer and up to a 25% risk of a second breast cancer. Men may have an increased risk for female breast cancer of about 1%. Men and women may have an increased risk of colon cancer (~10% lifetime risk). According to the NCCN guidelines, individuals with CHEK2 mutations should consider breast MRI's as a part of regular breast cancer screening, and depending on family history could consider a risk-reducing mastectomy.  Breast cancer screening should begin, for women, at age 77 or 48 years younger than the earliest age of onset.  Colon cancer screening should begin at age 55 and continue every 5 years or based on polyp  number     Jadan Hinojos, Virgie Dad, MD  03/13/18 9:47 AM Medical Oncology and Hematology [facility] [address] [place], [place] [address] Tel. [phone]    Fax. [phone]  Alice Rieger, am acting as scribe for Chauncey Cruel MD.  I, Lurline Del MD, have reviewed the above documentation for accuracy and completeness, and I agree with the above.

## 2018-03-13 ENCOUNTER — Inpatient Hospital Stay (HOSPITAL_BASED_OUTPATIENT_CLINIC_OR_DEPARTMENT_OTHER): Payer: Managed Care, Other (non HMO) | Admitting: Oncology

## 2018-03-13 ENCOUNTER — Inpatient Hospital Stay: Payer: Managed Care, Other (non HMO) | Attending: Oncology

## 2018-03-13 VITALS — BP 111/56 | HR 65 | Temp 98.4°F | Resp 18 | Ht 65.0 in | Wt 143.4 lb

## 2018-03-13 DIAGNOSIS — Z79810 Long term (current) use of selective estrogen receptor modulators (SERMs): Secondary | ICD-10-CM | POA: Diagnosis not present

## 2018-03-13 DIAGNOSIS — Z170 Estrogen receptor positive status [ER+]: Secondary | ICD-10-CM | POA: Diagnosis not present

## 2018-03-13 DIAGNOSIS — D0511 Intraductal carcinoma in situ of right breast: Secondary | ICD-10-CM

## 2018-03-13 DIAGNOSIS — M8580 Other specified disorders of bone density and structure, unspecified site: Secondary | ICD-10-CM | POA: Diagnosis not present

## 2018-03-13 DIAGNOSIS — Z1502 Genetic susceptibility to malignant neoplasm of ovary: Secondary | ICD-10-CM

## 2018-03-13 DIAGNOSIS — Z1501 Genetic susceptibility to malignant neoplasm of breast: Secondary | ICD-10-CM

## 2018-03-13 DIAGNOSIS — Z1589 Genetic susceptibility to other disease: Secondary | ICD-10-CM

## 2018-03-13 DIAGNOSIS — Z923 Personal history of irradiation: Secondary | ICD-10-CM

## 2018-03-13 DIAGNOSIS — Z1509 Genetic susceptibility to other malignant neoplasm: Secondary | ICD-10-CM | POA: Insufficient documentation

## 2018-03-13 DIAGNOSIS — C50411 Malignant neoplasm of upper-outer quadrant of right female breast: Secondary | ICD-10-CM | POA: Insufficient documentation

## 2018-03-13 DIAGNOSIS — C50919 Malignant neoplasm of unspecified site of unspecified female breast: Secondary | ICD-10-CM

## 2018-03-13 LAB — CBC WITH DIFFERENTIAL/PLATELET
BASOS PCT: 1 %
Basophils Absolute: 0 10*3/uL (ref 0.0–0.1)
Eosinophils Absolute: 0.2 10*3/uL (ref 0.0–0.5)
Eosinophils Relative: 4 %
HEMATOCRIT: 37.5 % (ref 34.8–46.6)
HEMOGLOBIN: 12.5 g/dL (ref 11.6–15.9)
LYMPHS ABS: 1.3 10*3/uL (ref 0.9–3.3)
LYMPHS PCT: 32 %
MCH: 32.6 pg (ref 25.1–34.0)
MCHC: 33.4 g/dL (ref 31.5–36.0)
MCV: 97.5 fL (ref 79.5–101.0)
MONOS PCT: 9 %
Monocytes Absolute: 0.4 10*3/uL (ref 0.1–0.9)
NEUTROS ABS: 2.2 10*3/uL (ref 1.5–6.5)
NEUTROS PCT: 54 %
Platelets: 160 10*3/uL (ref 145–400)
RBC: 3.84 MIL/uL (ref 3.70–5.45)
RDW: 13 % (ref 11.2–14.5)
WBC: 4.1 10*3/uL (ref 3.9–10.3)

## 2018-03-13 LAB — COMPREHENSIVE METABOLIC PANEL
ALT: 15 U/L (ref 0–44)
ANION GAP: 8 (ref 5–15)
AST: 19 U/L (ref 15–41)
Albumin: 3.5 g/dL (ref 3.5–5.0)
Alkaline Phosphatase: 50 U/L (ref 38–126)
BUN: 15 mg/dL (ref 8–23)
CHLORIDE: 106 mmol/L (ref 98–111)
CO2: 27 mmol/L (ref 22–32)
Calcium: 8.4 mg/dL — ABNORMAL LOW (ref 8.9–10.3)
Creatinine, Ser: 0.85 mg/dL (ref 0.44–1.00)
GFR calc non Af Amer: >60 mL/min (ref >60)
Glucose, Bld: 86 mg/dL (ref 70–99)
POTASSIUM: 4.1 mmol/L (ref 3.5–5.1)
Sodium: 141 mmol/L (ref 135–145)
Total Bilirubin: 0.6 mg/dL (ref 0.3–1.2)
Total Protein: 6.3 g/dL — ABNORMAL LOW (ref 6.5–8.1)

## 2018-03-13 MED ORDER — TAMOXIFEN CITRATE 20 MG PO TABS: 20.0000 mg | ORAL_TABLET | Freq: Every day | ORAL | 4 refills | Status: DC

## 2018-03-19 ENCOUNTER — Other Ambulatory Visit: Payer: Managed Care, Other (non HMO)

## 2018-03-19 ENCOUNTER — Ambulatory Visit: Payer: Managed Care, Other (non HMO) | Admitting: Oncology

## 2018-05-29 ENCOUNTER — Ambulatory Visit: Payer: Managed Care, Other (non HMO) | Admitting: Family Medicine

## 2018-06-09 ENCOUNTER — Other Ambulatory Visit: Payer: Self-pay | Admitting: Oncology

## 2018-06-13 ENCOUNTER — Ambulatory Visit: Payer: Managed Care, Other (non HMO) | Admitting: Sports Medicine

## 2018-09-12 ENCOUNTER — Encounter: Payer: Self-pay | Admitting: Oncology

## 2018-09-13 NOTE — Progress Notes (Signed)
[place]  [phone] [phone]    ID: Tina Nielsen DOB: 01-Feb-1951  MR#: [id number]  QJJ#:[id number]  Patient Care Team: Maurice Small, MD as PCP - General (Family Medicine) , Virgie Dad, MD as Consulting Physician (Oncology) Rolm Bookbinder, MD as Consulting Physician (General Surgery) Thea Silversmith, MD as Consulting Physician (Radiation Oncology) Christene Slates, MD as Physician Assistant (Radiology) Fontaine, Belinda Block, MD as Consulting Physician (Gynecology) OTHER MD:   CHIEF COMPLAINT: Estrogen receptor positive breast cancer  CURRENT TREATMENT:  anastrozole   BREAST CANCER HISTORY:  From the original intake note:  "Tina Nielsen" noted a possible lump in her left breast and was referred for diagnostic mammography with tomosynthesis at [facility] 08/17/2015. The breast composition was category D. There was no finding in the left breast to correlate with the patient's lower inner quadrant concern. Ultrasound was obtained the same day and was likewise unremarkable.   However in the right breast upper outer quadrant posteriorly there was a 4 mm area of grouped calcifications. This was felt to warrant biopsy, which was performed stereotactically 09/01/2015. The final pathology ([id number]) showed an invasive ductal carcinoma, grade 1, estrogen receptor 100% positive, progesterone receptor 90% positive, both with strong staining intensity, with an MIB-1 of 10%, and no HER-2 amplification, the signals ratio being 1.17 and the number per cell 2.35.  Bilateral breast MRI was obtained 09/06/2015. This describes the breast density as category C. In the upper outer quadrant of the right breast there was a clip. There was no suspicious surrounding enhancement. More laterally there was a 5.1 cm fluid collection compatible with a hematoma. The left breast was normal and there was no abnormal appearing adenopathy.   Her subsequent history is as detailed  below.   INTERVAL HISTORY: Tina Nielsen returns today for follow-up and treatment of her estrogen receptor positive breast cancer.  She continues on tamoxifen. She says she is having more discharge than usual. She is also having a lot of hot flashes. She would like to consider swapping to another medicine as a result of these side effects, particularly due to the discharge.  Mammography on 09/12/2018 at [facility] showed a breast density category D.  There was no evidence of malignancy.  Her last colonoscopy was completed by Dr. Maurene Capes on 07/28/2013. She is overdue for a repeat and would like a referral.    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 Since her last visit, she tripped, fell, and broke her knee cap, so she hasn't been able to exercise regularly. The patient denies unusual headaches, visual changes, nausea, vomiting, or dizziness. There has been no unusual cough, phlegm production, or pleurisy. This been no change in bowel or bladder habits. The patient denies unexplained fatigue or unexplained weight loss, bleeding, rash, or fever. A detailed review of systems was otherwise noncontributory.    PAST MEDICAL HISTORY: Past Medical History:  Diagnosis Date  . Anxiety   . Arthritis    knees  . Atrophic vaginitis   . Breast cancer of upper-outer quadrant of right female breast ([place]) 09/03/2015  . Family history of breast cancer   . High cholesterol   . Osteopenia     PAST SURGICAL HISTORY: Past Surgical History:  Procedure Laterality Date  . BREAST LUMPECTOMY WITH NEEDLE LOCALIZATION AND AXILLARY SENTINEL LYMPH NODE BX Right 09/30/2015   Procedure: RIGHT BREAST LUMPECTOMY WITH NEEDLE LOCALIZATION AND AXILLARY SENTINEL LYMPH NODE BX;  Surgeon: Rolm Bookbinder, MD;  Location: [place];  Service: General;  Laterality: Right;  . BREAST SURGERY     BIOPSY  . KNEE SURGERY    . TUBAL LIGATION      FAMILY HISTORY Family History  Problem Relation Age of Onset    . Hypertension Mother   . Breast cancer Mother 34       second cancer at 30  . Heart disease Mother   . Aneurysm Father        AORTIC  . Heart disease Maternal Grandfather   . Kidney cancer Maternal Aunt 72  . Prostate cancer Paternal Uncle 22  . Stroke Maternal Grandmother   . Breast cancer Other        MGMs sister  . Breast cancer Other        PGMs sister  . Breast cancer Other        PGF sister  . Colon cancer Neg Hx    The patient's father died from a ruptured aortic aneurysm the age of 63. The patient's mother was diagnosed with breast cancer at age 71. She had a second breast cancer diagnosed at age 7.She died at age 38. The patient had one brother, no sisters. As far as she is aware that there are no other breast or ovarian cancer instances in the family   GYNECOLOGIC HISTORY:  No LMP recorded. Patient is postmenopausal. Menarche age 68, first live birth age 2, which the patient knows increases the risk of breast cancer. She is GX P2. She stopped having periods around age 40. She did not take hormone replacement. 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 Her husband [name] taught history as well as coached basketball at the same school. He is now retired but is very active in the [employer]. He knows [name] well and has quite a few stories to tell regarding [facility] basketball. Their son [name] lives in [place] and works in Designer, television/film set as a Environmental manager. Son [name] lives in [place] working as a Designer, jewellery. The children have been made aware of the CHEK2 mutation and are considering testing.The patient has no grandchildren.    ADVANCED DIRECTIVES: In place   HEALTH MAINTENANCE: Social History   Tobacco Use  . Smoking status: Former Smoker    Last attempt to quit: 08/07/1976    Years since quitting: 42.1  .  Smokeless tobacco: Never Used  Substance Use Topics  . Alcohol use: Yes    Alcohol/week: 7.0 standard drinks    Types: 7 Glasses of wine per week  . Drug use: No     Colonoscopy: 2014/[facility]--repeat pending  PAP:  Bone density: June 2018 with a t score of -2.0 osteopenia  Lipid panel:  Allergies  Allergen Reactions  . Aleve [Naproxen] Other (See Comments)    "stomach irritation"  . Celebrex [Celecoxib]     Irritated stomach- caused burning  . Ibuprofen     "UPSET STOMACH"- burning    Current Outpatient Medications  Medication Sig Dispense Refill  . acetaminophen (TYLENOL) 500 MG tablet Take 500 mg by mouth every 6 (six) hours as needed.    Marland Kitchen anastrozole (ARIMIDEX) 1 MG tablet Take 1 tablet (1 mg total) by mouth daily. 90 tablet 4  . Calcium Carbonate-Vitamin D (CALCIUM + D PO) Take by mouth.      . escitalopram (LEXAPRO) 10 MG tablet take 1 tablet by mouth once  daily 30 tablet 12  . Glucosamine-Chondroitin (GLUCOSAMINE CHONDR COMPLEX PO) Take by mouth.      . mometasone (NASONEX) 50 MCG/ACT nasal spray Place 2 sprays into the nose daily.    . Multiple Vitamin (MULTIVITAMIN) capsule Take 1 capsule by mouth daily.      . pravastatin (PRAVACHOL) 40 MG tablet   0  . tamoxifen (NOLVADEX) 20 MG tablet Take 1 tablet (20 mg total) by mouth daily. 90 tablet 4  . tamoxifen (NOLVADEX) 20 MG tablet TAKE 1 TABLET BY MOUTH EVERY DAY 145 tablet 0  . VITAMIN E PO Take by mouth.       No current facility-administered medications for this visit.     OBJECTIVE: Middle-aged white woman in no acute distress  Vitals:   09/17/18 0852  BP: 132/61  Pulse: 60  Resp: 18  Temp: 98.6 F (37 C)  SpO2: 99%     Body mass index is 23.93 kg/m.    ECOG FS:1 - Symptomatic but completely ambulatory  Sclerae unicteric, EOMs intact No cervical or supraclavicular adenopathy Lungs no rales or rhonchi Heart regular rate and rhythm Abd soft, nontender, positive bowel sounds MSK no focal spinal  tenderness, no upper extremity lymphedema Neuro: nonfocal, well oriented, appropriate affect Breasts: Right breast is status post lumpectomy and radiation.  There is no evidence of disease recurrence.  Both axillae are benign.  LAB RESULTS:  CMP     Component Value Date/Time   NA 141 03/13/2018 0913   NA 140 12/07/2016 1504   K 4.1 03/13/2018 0913   K 3.8 12/07/2016 1504   CL 106 03/13/2018 0913   CO2 27 03/13/2018 0913   CO2 28 12/07/2016 1504   GLUCOSE 86 03/13/2018 0913   GLUCOSE 99 12/07/2016 1504   BUN 15 03/13/2018 0913   BUN 16.3 12/07/2016 1504   CREATININE 0.85 03/13/2018 0913   CREATININE 0.8 12/07/2016 1504   CALCIUM 8.4 (L) 03/13/2018 0913   CALCIUM 8.9 12/07/2016 1504   PROT 6.3 (L) 03/13/2018 0913   PROT 6.5 12/07/2016 1504   ALBUMIN 3.5 03/13/2018 0913   ALBUMIN 3.6 12/07/2016 1504   AST 19 03/13/2018 0913   AST 21 12/07/2016 1504   ALT 15 03/13/2018 0913   ALT 18 12/07/2016 1504   ALKPHOS 50 03/13/2018 0913   ALKPHOS 50 12/07/2016 1504   BILITOT 0.6 03/13/2018 0913   BILITOT 0.61 12/07/2016 1504   GFRNONAA >60 03/13/2018 0913   GFRAA >60 03/13/2018 0913    INo results found for: SPEP, UPEP  Lab Results  Component Value Date   WBC 4.6 09/17/2018   NEUTROABS 2.9 09/17/2018   HGB 12.1 09/17/2018   HCT 36.9 09/17/2018   MCV 98.1 09/17/2018   PLT 167 09/17/2018      Chemistry      Component Value Date/Time   NA 141 03/13/2018 0913   NA 140 12/07/2016 1504   K 4.1 03/13/2018 0913   K 3.8 12/07/2016 1504   CL 106 03/13/2018 0913   CO2 27 03/13/2018 0913   CO2 28 12/07/2016 1504   BUN 15 03/13/2018 0913   BUN 16.3 12/07/2016 1504   CREATININE 0.85 03/13/2018 0913   CREATININE 0.8 12/07/2016 1504      Component Value Date/Time   CALCIUM 8.4 (L) 03/13/2018 0913   CALCIUM 8.9 12/07/2016 1504   ALKPHOS 50 03/13/2018 0913   ALKPHOS 50 12/07/2016 1504   AST 19 03/13/2018 0913   AST 21 12/07/2016 1504   ALT  15 03/13/2018 0913   ALT 18  12/07/2016 1504   BILITOT 0.6 03/13/2018 0913   BILITOT 0.61 12/07/2016 1504       No results found for: LABCA2  No components found for: LABCA125  No results for input(s): INR in the last 168 hours.  Urinalysis    Component Value Date/Time   COLORURINE YELLOW 01/23/2017 1628   APPEARANCEUR CLEAR 01/23/2017 1628   LABSPEC 1.010 01/23/2017 1628   PHURINE 5.5 01/23/2017 1628   GLUCOSEU NEGATIVE 01/23/2017 1628   HGBUR NEGATIVE 01/23/2017 1628   BILIRUBINUR NEGATIVE 01/23/2017 1628   KETONESUR NEGATIVE 01/23/2017 1628   PROTEINUR NEGATIVE 01/23/2017 1628   UROBILINOGEN 0.2 02/10/2014 0846   NITRITE NEGATIVE 01/23/2017 1628   LEUKOCYTESUR NEGATIVE 01/23/2017 1628    ELIGIBLE FOR AVAILABLE RESEARCH PROTOCOL: no   STUDIES: Mammography from 09/12/2018 reviewed  ASSESSMENT: 68 y.o. [place] woman status post right breast upper outer quadrant biopsy 09/01/2015 for a clinical T1a N0, stage IA invasive ductal carcinoma, grade 1, estrogen and progesterone receptor positive, HER-2 not amplified, with an MIB-1 of 10%  (1) genetics testing 10/19/2015 through the Breast/Ovarian gene panel offered by [facility] found no deleterious mutations in ATM, BARD1, BRCA1, BRCA2, BRIP1, CDH1, EPCAM, FANCC, MLH1, MSH2, MSH6, NBN, PALB2, PMS2, PTEN, RAD51C, RAD51D, TP53, and XRCC2.  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   (b) Genetic testing also detected a Variant of Unknown Significance in the BRCA2 gene called [id number].  (c) breast cancer intensified screening will consist of breast MRI in July and breast mammography in January yearly  (d) colon cancer screening will consist of colonoscopy every 5 years or more often depending on findings   (i) most recent colonoscopy 07/28/2013; no polyps noted  (2) status post right lumpectomy with sentinel lymph node sampling 09/30/2015 for a pTis pN0, stage 0 ductal carcinoma in situ, grade 2, with  negative margins  (3) adjuvant radiation completed May 2017  (4) started tamoxifen 01/06/2016, switched to anastrozole starting 12/06/2018  (a) endometrial biopsy 10/11/2016 shows scant tissue with an atrophic pattern.  (5) bone density at [facility] 01/22/2017 shows a T score of -2.0  (a) repeat bone density July 2020   PLAN: Tina Nielsen is now 3 years out from definitive surgery for her breast cancer with no evidence of disease recurrence.  This is very favorable.  She has tolerated tamoxifen moderately well.  She does have some of the usual side effects including hot flashes and vaginal discharge.  Today we discussed the fact that if she changes to anastrozole and continues not to total 5 years of antiestrogens she will have an additional 2 to 3% drop in her risk.  Also the vaginal wetness problem will be switched to her vaginal dryness problem.  She is very interested, and we discussed the other possible toxicity side effects and complications of this agent.  She will start anastrozole 12/06/2018, after she runs out of her current tamoxifen supply.  She will have a bone density July of this year and she will see me in August to discuss how she is doing on the anastrozole.  From that point I will start seeing her on a once a year basis  She will be due for repeat colonoscopy this year and I have requested that through the bowel GI where she previously saw Dr. Maurene Capes  She knows to call for any other issue that may develop before the next visit.  FOR REFERENCE: CHEK2 mutations have  been found to be associated with an increased risk of breast and other cancers. The estimated cancer risks vary widely and may be influenced by family history. Women with a CHEK2 deleterious mutation have approximately a 24% (no family history of breast cancer) to 48% (strong family history of breast cancer) lifetime risk of breast cancer and up to a 25% risk of a second breast cancer. Men may have an increased risk for  female breast cancer of about 1%. Men and women may have an increased risk of colon cancer (~10% lifetime risk). According to the NCCN guidelines, individuals with CHEK2 mutations should consider breast MRI's as a part of regular breast cancer screening, and depending on family history could consider a risk-reducing mastectomy.  Breast cancer screening should begin, for women, at age 47 or 23 years younger than the earliest age of onset.  Colon cancer screening should begin at age 71 and continue every 5 years or based on polyp number    , Virgie Dad, MD  09/17/18 9:17 AM Medical Oncology and Hematology [facility] [address] [place], [place] [address] Tel. [phone]    Fax. [phone]  I, Jacqualyn Posey am acting as a Education administrator for Chauncey Cruel, MD.   I, Lurline Del MD, have reviewed the above documentation for accuracy and completeness, and I agree with the above.

## 2018-09-17 ENCOUNTER — Telehealth: Payer: Self-pay

## 2018-09-17 ENCOUNTER — Inpatient Hospital Stay: Payer: Managed Care, Other (non HMO) | Attending: Oncology | Admitting: Oncology

## 2018-09-17 ENCOUNTER — Telehealth: Payer: Self-pay | Admitting: Oncology

## 2018-09-17 ENCOUNTER — Encounter: Payer: Self-pay | Admitting: Oncology

## 2018-09-17 ENCOUNTER — Inpatient Hospital Stay: Payer: Managed Care, Other (non HMO)

## 2018-09-17 VITALS — BP 132/61 | HR 60 | Temp 98.6°F | Resp 18 | Wt 143.8 lb

## 2018-09-17 DIAGNOSIS — Z1509 Genetic susceptibility to other malignant neoplasm: Secondary | ICD-10-CM

## 2018-09-17 DIAGNOSIS — M8580 Other specified disorders of bone density and structure, unspecified site: Secondary | ICD-10-CM | POA: Diagnosis not present

## 2018-09-17 DIAGNOSIS — Z87891 Personal history of nicotine dependence: Secondary | ICD-10-CM

## 2018-09-17 DIAGNOSIS — Z170 Estrogen receptor positive status [ER+]: Secondary | ICD-10-CM

## 2018-09-17 DIAGNOSIS — C50411 Malignant neoplasm of upper-outer quadrant of right female breast: Secondary | ICD-10-CM | POA: Diagnosis present

## 2018-09-17 DIAGNOSIS — Z803 Family history of malignant neoplasm of breast: Secondary | ICD-10-CM

## 2018-09-17 DIAGNOSIS — Z923 Personal history of irradiation: Secondary | ICD-10-CM

## 2018-09-17 DIAGNOSIS — Z79810 Long term (current) use of selective estrogen receptor modulators (SERMs): Secondary | ICD-10-CM | POA: Diagnosis not present

## 2018-09-17 DIAGNOSIS — C50919 Malignant neoplasm of unspecified site of unspecified female breast: Secondary | ICD-10-CM

## 2018-09-17 DIAGNOSIS — Z1502 Genetic susceptibility to malignant neoplasm of ovary: Secondary | ICD-10-CM

## 2018-09-17 DIAGNOSIS — Z79899 Other long term (current) drug therapy: Secondary | ICD-10-CM | POA: Diagnosis not present

## 2018-09-17 DIAGNOSIS — Z1589 Genetic susceptibility to other disease: Secondary | ICD-10-CM

## 2018-09-17 LAB — CBC WITH DIFFERENTIAL/PLATELET
ABS IMMATURE GRANULOCYTES: 0.01 10*3/uL (ref 0.00–0.07)
BASOS ABS: 0 10*3/uL (ref 0.0–0.1)
Basophils Relative: 0 %
Eosinophils Absolute: 0.1 10*3/uL (ref 0.0–0.5)
Eosinophils Relative: 2 %
HCT: 36.9 % (ref 36.0–46.0)
HEMOGLOBIN: 12.1 g/dL (ref 12.0–15.0)
IMMATURE GRANULOCYTES: 0 %
LYMPHS PCT: 27 %
Lymphs Abs: 1.2 10*3/uL (ref 0.7–4.0)
MCH: 32.2 pg (ref 26.0–34.0)
MCHC: 32.8 g/dL (ref 30.0–36.0)
MCV: 98.1 fL (ref 80.0–100.0)
Monocytes Absolute: 0.3 10*3/uL (ref 0.1–1.0)
Monocytes Relative: 7 %
NEUTROS ABS: 2.9 10*3/uL (ref 1.7–7.7)
NEUTROS PCT: 64 %
NRBC: 0 % (ref 0.0–0.2)
Platelets: 167 10*3/uL (ref 150–400)
RBC: 3.76 MIL/uL — AB (ref 3.87–5.11)
RDW: 12.8 % (ref 11.5–15.5)
WBC: 4.6 10*3/uL (ref 4.0–10.5)

## 2018-09-17 LAB — COMPREHENSIVE METABOLIC PANEL
ALT: 17 U/L (ref 0–44)
ANION GAP: 7 (ref 5–15)
AST: 20 U/L (ref 15–41)
Albumin: 3.5 g/dL (ref 3.5–5.0)
Alkaline Phosphatase: 52 U/L (ref 38–126)
BUN: 10 mg/dL (ref 8–23)
CO2: 30 mmol/L (ref 22–32)
Calcium: 8.7 mg/dL — ABNORMAL LOW (ref 8.9–10.3)
Chloride: 105 mmol/L (ref 98–111)
Creatinine, Ser: 0.87 mg/dL (ref 0.44–1.00)
Glucose, Bld: 74 mg/dL (ref 70–99)
POTASSIUM: 3.7 mmol/L (ref 3.5–5.1)
SODIUM: 142 mmol/L (ref 135–145)
Total Bilirubin: 0.5 mg/dL (ref 0.3–1.2)
Total Protein: 6.3 g/dL — ABNORMAL LOW (ref 6.5–8.1)

## 2018-09-17 MED ORDER — ANASTROZOLE 1 MG PO TABS: 1.0000 mg | ORAL_TABLET | Freq: Every day | ORAL | 4 refills | Status: DC

## 2018-09-17 NOTE — Telephone Encounter (Signed)
Called and LM for pt to call back to schedule an appt with Dr. Loni Muse to discuss routine colonoscopy.   Dear Richardson Landry:  I am referring my patient, Tina Nielsen, to you for screening colonoscopy. She carries a CHEK2 mutation and colonoscopy every 5 years is recommended.  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   I appreciate your assistance in her care and look forward to your findings and recommendations.  With best regards,                   Chauncey Cruel, MD

## 2018-09-17 NOTE — Telephone Encounter (Signed)
-----   Message from Yetta Flock, MD sent at 09/17/2018 12:24 PM EST ----- Geri Seminole, Can you book this patient for a routine office visit with me? Thanks ----- Message ----- From: Chauncey Cruel, MD Sent: 09/17/2018   9:06 AM EST To: Yetta Flock, MD

## 2018-09-17 NOTE — Telephone Encounter (Signed)
patient decline avs and calendar

## 2018-09-19 NOTE — Telephone Encounter (Signed)
LM for pt to call us to schedule an OV with Dr. Loni Muse

## 2018-09-23 NOTE — Telephone Encounter (Signed)
Scheduled pt with Nicoletta Ba during her spring break week, on 3-20. Dr. Havery Moros is off that week.

## 2018-10-07 ENCOUNTER — Other Ambulatory Visit: Payer: Self-pay | Admitting: Oncology

## 2018-10-23 ENCOUNTER — Ambulatory Visit: Payer: Managed Care, Other (non HMO) | Admitting: Women's Health

## 2018-10-23 ENCOUNTER — Other Ambulatory Visit: Payer: Self-pay

## 2018-10-23 ENCOUNTER — Encounter: Payer: Self-pay | Admitting: Women's Health

## 2018-10-23 VITALS — BP 110/78 | Ht 65.0 in | Wt 140.0 lb

## 2018-10-23 DIAGNOSIS — N898 Other specified noninflammatory disorders of vagina: Secondary | ICD-10-CM

## 2018-10-23 DIAGNOSIS — Z01419 Encounter for gynecological examination (general) (routine) without abnormal findings: Secondary | ICD-10-CM | POA: Diagnosis not present

## 2018-10-23 LAB — WET PREP FOR TRICH, YEAST, CLUE

## 2018-10-23 NOTE — Addendum Note (Signed)
Addended by: Joaquin Music on: 10/23/2018 01:59 PM   Modules accepted: Orders

## 2018-10-23 NOTE — Patient Instructions (Signed)
shingrex vaccine  2 series shot shingles prevention  Health Maintenance After Age 69 After age 75, you are at a higher risk for certain long-term diseases and infections as well as injuries from falls. Falls are a major cause of broken bones and head injuries in people who are older than age 4. Getting regular preventive care can help to keep you healthy and well. Preventive care includes getting regular testing and making lifestyle changes as recommended by your health care provider. Talk with your health care provider about:  Which screenings and tests you should have. A screening is a test that checks for a disease when you have no symptoms.  A diet and exercise plan that is right for you. What should I know about screenings and tests to prevent falls? Screening and testing are the best ways to find a health problem early. Early diagnosis and treatment give you the best chance of managing medical conditions that are common after age 24. Certain conditions and lifestyle choices may make you more likely to have a fall. Your health care provider may recommend:  Regular vision checks. Poor vision and conditions such as cataracts can make you more likely to have a fall. If you wear glasses, make sure to get your prescription updated if your vision changes.  Medicine review. Work with your health care provider to regularly review all of the medicines you are taking, including over-the-counter medicines. Ask your health care provider about any side effects that may make you more likely to have a fall. Tell your health care provider if any medicines that you take make you feel dizzy or sleepy.  Osteoporosis screening. Osteoporosis is a condition that causes the bones to get weaker. This can make the bones weak and cause them to break more easily.  Blood pressure screening. Blood pressure changes and medicines to control blood pressure can make you feel dizzy.  Strength and balance checks. Your health  care provider may recommend certain tests to check your strength and balance while standing, walking, or changing positions.  Foot health exam. Foot pain and numbness, as well as not wearing proper footwear, can make you more likely to have a fall.  Depression screening. You may be more likely to have a fall if you have a fear of falling, feel emotionally low, or feel unable to do activities that you used to do.  Alcohol use screening. Using too much alcohol can affect your balance and may make you more likely to have a fall. What actions can I take to lower my risk of falls? General instructions  Talk with your health care provider about your risks for falling. Tell your health care provider if: ? You fall. Be sure to tell your health care provider about all falls, even ones that seem minor. ? You feel dizzy, sleepy, or off-balance.  Take over-the-counter and prescription medicines only as told by your health care provider. These include any supplements.  Eat a healthy diet and maintain a healthy weight. A healthy diet includes low-fat dairy products, low-fat (lean) meats, and fiber from whole grains, beans, and lots of fruits and vegetables. Home safety  Remove any tripping hazards, such as rugs, cords, and clutter.  Install safety equipment such as grab bars in bathrooms and safety rails on stairs.  Keep rooms and walkways well-lit. Activity   Follow a regular exercise program to stay fit. This will help you maintain your balance. Ask your health care provider what types of exercise are appropriate  for you.  If you need a cane or walker, use it as recommended by your health care provider.  Wear supportive shoes that have nonskid soles. Lifestyle  Do not drink alcohol if your health care provider tells you not to drink.  If you drink alcohol, limit how much you have: ? 0-1 drink a day for women. ? 0-2 drinks a day for men.  Be aware of how much alcohol is in your drink. In  the [place] one drink equals one typical bottle of beer (12 oz), one-half glass of wine (5 oz), or one shot of hard liquor (1 oz).  Do not use any products that contain nicotine or tobacco, such as cigarettes and e-cigarettes. If you need help quitting, ask your health care provider. Summary  Having a healthy lifestyle and getting preventive care can help to protect your health and wellness after age 38.  Screening and testing are the best way to find a health problem early and help you avoid having a fall. Early diagnosis and treatment give you the best chance for managing medical conditions that are more common for people who are older than age 67.  Falls are a major cause of broken bones and head injuries in people who are older than age 37. Take precautions to prevent a fall at home.  Work with your health care provider to learn what changes you can make to improve your health and wellness and to prevent falls. This information is not intended to replace advice given to you by your health care provider. Make sure you discuss any questions you have with your health care provider. Document Released: 06/06/2017 Document Revised: 06/06/2017 Document Reviewed: 06/06/2017 Elsevier Interactive Patient Education  2019 [employer].

## 2018-10-23 NOTE — Progress Notes (Signed)
Tina Nielsen 11-19-1950 [id number]    History:    Presents for annual exam.  Postmenopausal on no HRT with no bleeding.  Has had increased discharge without itching or an odor.  09/2015 right breast cancer ER PR positive HER-2 negative CHEK 2 positive completed 3 years of tamoxifen and is going to start arimidex.  Mother breast cancer x2 deceased at age 69/25/2018 had spotting negative biopsy and negative sonohysterogram.  2014- colonoscopy.  DEXA at [facility] 2018-2 FRAX 10% / 1.7%.  Has had Pneumovax not  Shingrex.  Traumatic fall this past year causing a crack in the kneecap which is now  healing.  Past medical history, past surgical history, family history and social history were all reviewed and documented in the EPIC chart.  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  2 sons 1 lives in [place] and one in [place].  ROS:  A ROS was performed and pertinent positives and negatives are included.  Exam:  Vitals:   10/23/18 1007  BP: 110/78  Weight: 140 lb (63.5 kg)  Height: 5' 5"  (1.651 m)   Body mass index is 23.3 kg/m.   General appearance:  Normal Thyroid:  Symmetrical, normal in size, without palpable masses or nodularity. Respiratory  Auscultation:  Clear without wheezing or rhonchi Cardiovascular  Auscultation:  Regular rate, without rubs, murmurs or gallops  Edema/varicosities:  Not grossly evident Abdominal  Soft,nontender, without masses, guarding or rebound.  Liver/spleen:  No organomegaly noted  Hernia:  None appreciated  Skin  Inspection:  Grossly normal   Breasts: Examined lying and sitting.     Right: Without masses, retractions, discharge or axillary adenopathy.     Left: Without masses, retractions, discharge or axillary adenopathy. Gentitourinary   Inguinal/mons:  Normal without inguinal adenopathy  External genitalia:  Normal  BUS/Urethra/Skene's glands:  Normal  Vagina: Mild atrophy, white discharge negative wet prep  Cervix:  Normal  Uterus:  normal in size,  shape and contour.  Midline and mobile  Adnexa/parametria:     Rt: Without masses or tenderness.   Lt: Without masses or tenderness.  Anus and perineum: Normal  Digital rectal exam: Normal sphincter tone without palpated masses or tenderness  Assessment/Plan:  68 y.o. MWF G2 P2 for annual exam with no complaints other than increased vaginal discharge.  Postmenopausal/no HRT/no bleeding 09/2015 right breast cancer positive ER PR HER-2 negative CHEX 2 pos on Arimidex Dr Jana Hakim manages   Osteopenia without elevated FRAX Hypercholesteremia, anxiety/depression-primary care manages labs and meds  Plan: Reviewed normality of exam and wet prep, reassurance given.  Instructed to call if any bleeding.  Continue healthy lifestyle of regular exercise, encouraged increased balance type exercise of yoga or tai chi, home safety, fall prevention discussed.  SBEs, continue annual 3D screening mammogram, calcium rich foods, vitamin D 2000 daily encouraged.  Shingrex reviewed and encouraged.  New Pap screening guidelines reviewed no Pap.   Huel Cote [facility], 12:46 PM 10/23/2018

## 2018-10-24 ENCOUNTER — Telehealth: Payer: Self-pay

## 2018-10-24 NOTE — Telephone Encounter (Signed)
Left message asking the patient to call back. Per provider the appointment to discuss the colonoscopy is not necessary. She will be due a colonoscopy in 2024. Last colonoscopy was 2014 and no polyps were found.

## 2018-10-24 NOTE — Telephone Encounter (Signed)

## 2018-10-25 ENCOUNTER — Ambulatory Visit: Payer: Managed Care, Other (non HMO) | Admitting: Physician Assistant

## 2018-11-05 ENCOUNTER — Encounter: Payer: Managed Care, Other (non HMO) | Admitting: Women's Health

## 2019-03-23 NOTE — Progress Notes (Signed)
Tina Nielsen  Telephone:([phone]) [phone] Fax:(336) [phone]    ID: Tina Nielsen DOB: 18-Sep-1950  MR#: [id number]  EHM#:[id number]  Patient Care Team: Maurice Small, MD as PCP - General (Family Medicine) [name], Virgie Dad, MD as Consulting Physician (Oncology) Rolm Bookbinder, MD as Consulting Physician (General Surgery) Thea Silversmith, MD as Consulting Physician (Radiation Oncology) Christene Slates, MD as Physician Assistant (Radiology) Phineas Real, Belinda Block, MD as Consulting Physician (Gynecology) Huel Cote, NP as Nurse Practitioner (Obstetrics and Gynecology) OTHER MD:  CHIEF COMPLAINT: Estrogen receptor positive breast cancer  CURRENT TREATMENT:  exemestane   INTERVAL HISTORY: "Tina Nielsen" returns today for follow-up and treatment of her estrogen receptor positive breast cancer.  She went off anastrozole about a month ago.  She just felt like she was 68 years older.  She did not have significantly worse hot flashes or vaginal dryness issues.  Her most recent mammography was at [facility] 09/12/2018 showing breast density D.  There was no evidence of malignancy.  REVIEW OF SYSTEMS: Tina Nielsen continues to work full-time of course and they are taking pandemic precautions at work.  She just saw her gynecologist and had her gynecologic exam.  She also had her Pravachol increased to 5.  Possibly the aromatase inhibitors may have contributed to the increase in cholesterol.  She is not exercising as regularly as I would like.  Aside from these issues a detailed review of systems today was stable.  BREAST CANCER HISTORY:  From the original intake note:  "[name]" noted a possible lump in her left breast and was referred for diagnostic mammography with tomosynthesis at [facility] 08/17/2015. The breast composition was category D. There was no finding in the left breast to correlate with the patient's lower inner quadrant concern. Ultrasound was obtained the same day and was likewise  unremarkable.   However in the right breast upper outer quadrant posteriorly there was a 4 mm area of grouped calcifications. This was felt to warrant biopsy, which was performed stereotactically 09/01/2015. The final pathology ([id number]) showed an invasive ductal carcinoma, grade 1, estrogen receptor 100% positive, progesterone receptor 90% positive, both with strong staining intensity, with an MIB-1 of 10%, and no HER-2 amplification, the signals ratio being 1.17 and the number per cell 2.35.  Bilateral breast MRI was obtained 09/06/2015. This describes the breast density as category C. In the upper outer quadrant of the right breast there was a clip. There was no suspicious surrounding enhancement. More laterally there was a 5.1 cm fluid collection compatible with a hematoma. The left breast was normal and there was no abnormal appearing adenopathy.   Her subsequent history is as detailed below.   PAST MEDICAL HISTORY: Past Medical History:  Diagnosis Date   Anxiety    Arthritis    knees   Atrophic vaginitis    Breast cancer of upper-outer quadrant of right female breast ([place]) 09/03/2015   Family history of breast cancer    High cholesterol    Osteopenia     PAST SURGICAL HISTORY: Past Surgical History:  Procedure Laterality Date   BREAST LUMPECTOMY WITH NEEDLE LOCALIZATION AND AXILLARY SENTINEL LYMPH NODE BX Right 09/30/2015   Procedure: RIGHT BREAST LUMPECTOMY WITH NEEDLE LOCALIZATION AND AXILLARY SENTINEL LYMPH NODE BX;  Surgeon: Rolm Bookbinder, MD;  Location: [facility];  Service: General;  Laterality: Right;   BREAST SURGERY     BIOPSY   KNEE SURGERY     TUBAL LIGATION      FAMILY HISTORY Family History  Problem Relation Age of Onset   Hypertension Mother    Breast cancer Mother 52       second cancer at 43   Heart disease Mother    Aneurysm Father        AORTIC   Heart disease Maternal Grandfather    Kidney cancer Maternal Aunt  40   Prostate cancer Paternal Uncle 36   Stroke Maternal Grandmother    Breast cancer Other        MGMs sister   Breast cancer Other        PGMs sister   Breast cancer Other        PGF sister   Colon cancer Neg Hx    The patient's father died from a ruptured aortic aneurysm the age of 1. The patient's mother was diagnosed with breast cancer at age 16. She had a second breast cancer diagnosed at age 45.She died at age 91. The patient had one brother, no sisters. As far as she is aware that there are no other breast or ovarian cancer instances in the family   GYNECOLOGIC HISTORY:  No LMP recorded. Patient is postmenopausal. Menarche age 31, first live birth age 57, which the patient knows increases the risk of breast cancer. She is GX P2. She stopped having periods around age 57. She did not take hormone replacement. 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 Her husband [name] taught history as well as coached basketball at the same school. He is now retired but is very active in the [employer]. He knows [name] well and has quite a few stories to tell regarding [facility] basketball. Their son [name] lives in [place] and works in Designer, television/film set as a Environmental manager. Son [name] lives in [place] working as a Designer, jewellery. The children have been made aware of the CHEK2 mutation and are considering testing.The patient has no grandchildren.    ADVANCED DIRECTIVES: In place   HEALTH MAINTENANCE: Social History   Tobacco Use   Smoking status: Former Smoker    Quit date: 08/07/1976    Years since quitting: 42.6   Smokeless tobacco: Never Used  Substance Use Topics   Alcohol use: Yes    Alcohol/week: 7.0 standard drinks    Types: 7 Glasses of wine per week   Drug use: No     Colonoscopy: 2014/[facility]--repeat due 2024  PAP:  Bone  density: June 2018 with a t score of -2.0 osteopenia  Lipid panel:  Allergies  Allergen Reactions   Aleve [Naproxen] Other (See Comments)    "stomach irritation"   Celebrex [Celecoxib]     Irritated stomach- caused burning   Ibuprofen     "UPSET STOMACH"- burning    Current Outpatient Medications  Medication Sig Dispense Refill   acetaminophen (TYLENOL) 500 MG tablet Take 500 mg by mouth every 6 (six) hours as needed.     anastrozole (ARIMIDEX) 1 MG tablet Take 1 tablet (1 mg total) by mouth daily. 90 tablet 4   Calcium Carbonate-Vitamin D (CALCIUM + D PO) Take by mouth.       escitalopram (LEXAPRO) 10 MG tablet take 1 tablet by mouth once daily 30 tablet 12   exemestane (AROMASIN) 25 MG tablet Take 1 tablet (25 mg total) by mouth daily after breakfast. 90 tablet 4   Glucosamine-Chondroitin (GLUCOSAMINE CHONDR COMPLEX PO) Take by mouth.  mometasone (NASONEX) 50 MCG/ACT nasal spray Place 2 sprays into the nose daily.     Multiple Vitamin (MULTIVITAMIN) capsule Take 1 capsule by mouth daily.       pravastatin (PRAVACHOL) 40 MG tablet   0   VITAMIN E PO Take by mouth.       No current facility-administered medications for this visit.     OBJECTIVE: Middle-aged white woman who appears stated age 64:   03/25/19 0911  BP: 121/67  Pulse: (!) 59  Resp: 18  Temp: 98.5 F (36.9 C)  SpO2: 96%     Body mass index is 23.5 kg/m.    ECOG FS:1 - Symptomatic but completely ambulatory  Sclerae unicteric, EOMs intact Wearing a mask No cervical or supraclavicular adenopathy Lungs no rales or rhonchi Heart regular rate and rhythm Abd soft, nontender, positive bowel sounds MSK no focal spinal tenderness, no upper extremity lymphedema Neuro: nonfocal, well oriented, appropriate affect Breasts: The right breast is status post lumpectomy followed by radiation.  The left breast is benign.  Both axillae are benign.    LAB RESULTS:  CMP     Component Value Date/Time    NA 142 09/17/2018 0830   NA 140 12/07/2016 1504   K 3.7 09/17/2018 0830   K 3.8 12/07/2016 1504   CL 105 09/17/2018 0830   CO2 30 09/17/2018 0830   CO2 28 12/07/2016 1504   GLUCOSE 74 09/17/2018 0830   GLUCOSE 99 12/07/2016 1504   BUN 10 09/17/2018 0830   BUN 16.3 12/07/2016 1504   CREATININE 0.87 09/17/2018 0830   CREATININE 0.8 12/07/2016 1504   CALCIUM 8.7 (L) 09/17/2018 0830   CALCIUM 8.9 12/07/2016 1504   PROT 6.3 (L) 09/17/2018 0830   PROT 6.5 12/07/2016 1504   ALBUMIN 3.5 09/17/2018 0830   ALBUMIN 3.6 12/07/2016 1504   AST 20 09/17/2018 0830   AST 21 12/07/2016 1504   ALT 17 09/17/2018 0830   ALT 18 12/07/2016 1504   ALKPHOS 52 09/17/2018 0830   ALKPHOS 50 12/07/2016 1504   BILITOT 0.5 09/17/2018 0830   BILITOT 0.61 12/07/2016 1504   GFRNONAA >60 09/17/2018 0830   GFRAA >60 09/17/2018 0830    INo results found for: SPEP, UPEP  Lab Results  Component Value Date   WBC 6.4 03/25/2019   NEUTROABS 4.2 03/25/2019   HGB 13.4 03/25/2019   HCT 40.4 03/25/2019   MCV 98.1 03/25/2019   PLT 204 03/25/2019      Chemistry      Component Value Date/Time   NA 142 09/17/2018 0830   NA 140 12/07/2016 1504   K 3.7 09/17/2018 0830   K 3.8 12/07/2016 1504   CL 105 09/17/2018 0830   CO2 30 09/17/2018 0830   CO2 28 12/07/2016 1504   BUN 10 09/17/2018 0830   BUN 16.3 12/07/2016 1504   CREATININE 0.87 09/17/2018 0830   CREATININE 0.8 12/07/2016 1504      Component Value Date/Time   CALCIUM 8.7 (L) 09/17/2018 0830   CALCIUM 8.9 12/07/2016 1504   ALKPHOS 52 09/17/2018 0830   ALKPHOS 50 12/07/2016 1504   AST 20 09/17/2018 0830   AST 21 12/07/2016 1504   ALT 17 09/17/2018 0830   ALT 18 12/07/2016 1504   BILITOT 0.5 09/17/2018 0830   BILITOT 0.61 12/07/2016 1504       No results found for: LABCA2  No components found for: LABCA125  No results for input(s): INR in the last 168 hours.  Urinalysis  Component Value Date/Time   COLORURINE YELLOW 01/23/2017  1628   APPEARANCEUR CLEAR 01/23/2017 1628   LABSPEC 1.010 01/23/2017 1628   PHURINE 5.5 01/23/2017 1628   GLUCOSEU NEGATIVE 01/23/2017 1628   HGBUR NEGATIVE 01/23/2017 1628   BILIRUBINUR NEGATIVE 01/23/2017 1628   KETONESUR NEGATIVE 01/23/2017 1628   PROTEINUR NEGATIVE 01/23/2017 1628   UROBILINOGEN 0.2 02/10/2014 0846   NITRITE NEGATIVE 01/23/2017 1628   LEUKOCYTESUR NEGATIVE 01/23/2017 1628    ELIGIBLE FOR AVAILABLE RESEARCH PROTOCOL: no   STUDIES: No results found.   ASSESSMENT: 68 y.o. [place] woman status post right breast upper outer quadrant biopsy 09/01/2015 for a clinical T1a N0, stage IA invasive ductal carcinoma, grade 1, estrogen and progesterone receptor positive, HER-2 not amplified, with an MIB-1 of 10%  (1) genetics testing 10/19/2015 through the Breast/Ovarian gene panel offered by [facility] found no deleterious mutations in ATM, BARD1, BRCA1, BRCA2, BRIP1, CDH1, EPCAM, FANCC, MLH1, MSH2, MSH6, NBN, PALB2, PMS2, PTEN, RAD51C, RAD51D, TP53, and XRCC2.  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   (b) Genetic testing also detected a Variant of Unknown Significance in the BRCA2 gene called [id number].  (c) breast cancer intensified screening will consist of breast MRI in July and breast mammography in January yearly  (d) colon cancer screening will consist of colonoscopy every 5 years or more often depending on findings   (i) most recent colonoscopy 07/28/2013; no polyps noted  (2) status post right lumpectomy with sentinel lymph node sampling 09/30/2015 for a pTis pN0, stage 0 ductal carcinoma in situ, grade 2, with negative margins  (3) adjuvant radiation completed May 2017  (4) started tamoxifen 01/06/2016, switched to anastrozole starting 12/06/2018  (a) endometrial biopsy 10/11/2016 shows scant tissue with an atrophic pattern.  (5) bone density at [facility] 01/22/2017 shows a T score of -2.0  (a) repeat bone  density July 2020   PLAN: Tina Nielsen was not able to tolerate anastrozole.  We discussed in detail the available antiestrogens as well as the indications for antiestrogens.  She understands that as far as her breast cancer was concerned antiestrogens need to be considered, but her risk of that cancer recurring is so low that if she did not want to take antiestrogens from that point of view that would be acceptable.  The bigger problem is her mutation.  I think antiestrogens would lower her risk of developing a new breast cancer from about 40% to about 20%.  She understands this is not a life for this matter since we are doing intensified screening and if she does develop another cancer we should find it early.  Nevertheless she would like to try different antiestrogens.  We discussed exemestane.  I have placed the prescription in.  She will let me know if there is a cost issue or if there are any side effects.  In terms of intensified screening she needs to go ahead and have her MRI yearly since her breasts are density D and mammograms are essentially useless in her case.  Finally she is behind on her bone density and that also has been ordered  She is a bit behind on colonoscopy, which should have been done last year, but we are going to wait until she has been vaccinated against the coronavirus.  That will be likely next year.  She will see me again in November.  She knows to call for any issues that may develop before then.   Brahim Dolman, Virgie Dad, MD  03/25/19 9:36 AM Medical Oncology and Hematology [facility] [address] [place],  [address] Tel. [phone]    Fax. [phone]   I, Wilburn Mylar, am acting as scribe for Dr. Virgie Dad. Vasti Yagi.  I, Lurline Del MD, have reviewed the above documentation for accuracy and completeness, and I agree with the above.

## 2019-03-25 ENCOUNTER — Other Ambulatory Visit: Payer: Self-pay

## 2019-03-25 ENCOUNTER — Inpatient Hospital Stay: Payer: Managed Care, Other (non HMO)

## 2019-03-25 ENCOUNTER — Inpatient Hospital Stay: Payer: Managed Care, Other (non HMO) | Attending: Oncology | Admitting: Oncology

## 2019-03-25 VITALS — BP 121/67 | HR 59 | Temp 98.5°F | Resp 18 | Ht 65.0 in | Wt 141.2 lb

## 2019-03-25 DIAGNOSIS — C50411 Malignant neoplasm of upper-outer quadrant of right female breast: Secondary | ICD-10-CM | POA: Diagnosis not present

## 2019-03-25 DIAGNOSIS — Z87891 Personal history of nicotine dependence: Secondary | ICD-10-CM | POA: Insufficient documentation

## 2019-03-25 DIAGNOSIS — Z1509 Genetic susceptibility to other malignant neoplasm: Secondary | ICD-10-CM

## 2019-03-25 DIAGNOSIS — C50919 Malignant neoplasm of unspecified site of unspecified female breast: Secondary | ICD-10-CM

## 2019-03-25 DIAGNOSIS — D0511 Intraductal carcinoma in situ of right breast: Secondary | ICD-10-CM

## 2019-03-25 DIAGNOSIS — Z7951 Long term (current) use of inhaled steroids: Secondary | ICD-10-CM | POA: Insufficient documentation

## 2019-03-25 DIAGNOSIS — Z803 Family history of malignant neoplasm of breast: Secondary | ICD-10-CM | POA: Insufficient documentation

## 2019-03-25 DIAGNOSIS — Z1589 Genetic susceptibility to other disease: Secondary | ICD-10-CM

## 2019-03-25 DIAGNOSIS — Z1502 Genetic susceptibility to malignant neoplasm of ovary: Secondary | ICD-10-CM

## 2019-03-25 DIAGNOSIS — F419 Anxiety disorder, unspecified: Secondary | ICD-10-CM | POA: Diagnosis not present

## 2019-03-25 DIAGNOSIS — Z170 Estrogen receptor positive status [ER+]: Secondary | ICD-10-CM | POA: Insufficient documentation

## 2019-03-25 DIAGNOSIS — Z79811 Long term (current) use of aromatase inhibitors: Secondary | ICD-10-CM | POA: Insufficient documentation

## 2019-03-25 DIAGNOSIS — M8580 Other specified disorders of bone density and structure, unspecified site: Secondary | ICD-10-CM | POA: Insufficient documentation

## 2019-03-25 DIAGNOSIS — Z79899 Other long term (current) drug therapy: Secondary | ICD-10-CM | POA: Diagnosis not present

## 2019-03-25 DIAGNOSIS — Z923 Personal history of irradiation: Secondary | ICD-10-CM | POA: Diagnosis not present

## 2019-03-25 LAB — CBC WITH DIFFERENTIAL/PLATELET
Abs Immature Granulocytes: 0.02 10*3/uL (ref 0.00–0.07)
Basophils Absolute: 0 10*3/uL (ref 0.0–0.1)
Basophils Relative: 1 %
Eosinophils Absolute: 0.1 10*3/uL (ref 0.0–0.5)
Eosinophils Relative: 2 %
HCT: 40.4 % (ref 36.0–46.0)
Hemoglobin: 13.4 g/dL (ref 12.0–15.0)
Immature Granulocytes: 0 %
Lymphocytes Relative: 24 %
Lymphs Abs: 1.5 10*3/uL (ref 0.7–4.0)
MCH: 32.5 pg (ref 26.0–34.0)
MCHC: 33.2 g/dL (ref 30.0–36.0)
MCV: 98.1 fL (ref 80.0–100.0)
Monocytes Absolute: 0.5 10*3/uL (ref 0.1–1.0)
Monocytes Relative: 8 %
Neutro Abs: 4.2 10*3/uL (ref 1.7–7.7)
Neutrophils Relative %: 65 %
Platelets: 204 10*3/uL (ref 150–400)
RBC: 4.12 MIL/uL (ref 3.87–5.11)
RDW: 12.1 % (ref 11.5–15.5)
WBC: 6.4 10*3/uL (ref 4.0–10.5)
nRBC: 0 % (ref 0.0–0.2)

## 2019-03-25 LAB — COMPREHENSIVE METABOLIC PANEL
ALT: 29 U/L (ref 0–44)
AST: 27 U/L (ref 15–41)
Albumin: 4 g/dL (ref 3.5–5.0)
Alkaline Phosphatase: 65 U/L (ref 38–126)
Anion gap: 10 (ref 5–15)
BUN: 15 mg/dL (ref 8–23)
CO2: 26 mmol/L (ref 22–32)
Calcium: 9 mg/dL (ref 8.9–10.3)
Chloride: 105 mmol/L (ref 98–111)
Creatinine, Ser: 0.85 mg/dL (ref 0.44–1.00)
GFR calc Af Amer: >60 mL/min (ref >60)
GFR calc non Af Amer: >60 mL/min (ref >60)
Glucose, Bld: 87 mg/dL (ref 70–99)
Potassium: 4 mmol/L (ref 3.5–5.1)
Sodium: 141 mmol/L (ref 135–145)
Total Bilirubin: 0.7 mg/dL (ref 0.3–1.2)
Total Protein: 7 g/dL (ref 6.5–8.1)

## 2019-03-25 MED ORDER — EXEMESTANE 25 MG PO TABS: 25.0000 mg | ORAL_TABLET | Freq: Every day | ORAL | 4 refills | Status: DC

## 2019-03-27 ENCOUNTER — Telehealth: Payer: Self-pay | Admitting: Oncology

## 2019-03-27 NOTE — Telephone Encounter (Signed)
I left a message regarding schedule  

## 2019-05-14 ENCOUNTER — Encounter: Payer: Self-pay | Admitting: Gynecology

## 2019-05-23 ENCOUNTER — Encounter: Payer: Self-pay | Admitting: Oncology

## 2019-06-16 ENCOUNTER — Other Ambulatory Visit: Payer: Self-pay

## 2019-06-16 DIAGNOSIS — Z20822 Contact with and (suspected) exposure to covid-19: Secondary | ICD-10-CM

## 2019-06-18 LAB — NOVEL CORONAVIRUS, NAA: SARS-CoV-2, NAA: NOT DETECTED

## 2019-06-30 ENCOUNTER — Other Ambulatory Visit: Payer: Self-pay | Admitting: *Deleted

## 2019-06-30 DIAGNOSIS — Z170 Estrogen receptor positive status [ER+]: Secondary | ICD-10-CM

## 2019-06-30 DIAGNOSIS — C50411 Malignant neoplasm of upper-outer quadrant of right female breast: Secondary | ICD-10-CM
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
## 2019-07-01 ENCOUNTER — Inpatient Hospital Stay: Payer: Managed Care, Other (non HMO) | Attending: Oncology | Admitting: Oncology

## 2019-07-01 ENCOUNTER — Other Ambulatory Visit: Payer: Self-pay

## 2019-07-01 ENCOUNTER — Inpatient Hospital Stay: Payer: Managed Care, Other (non HMO)

## 2019-07-01 VITALS — BP 114/70 | HR 60 | Temp 98.3°F | Resp 18 | Ht 65.0 in | Wt 139.9 lb

## 2019-07-01 DIAGNOSIS — Z8042 Family history of malignant neoplasm of prostate: Secondary | ICD-10-CM | POA: Diagnosis not present

## 2019-07-01 DIAGNOSIS — Z79810 Long term (current) use of selective estrogen receptor modulators (SERMs): Secondary | ICD-10-CM | POA: Insufficient documentation

## 2019-07-01 DIAGNOSIS — M8580 Other specified disorders of bone density and structure, unspecified site: Secondary | ICD-10-CM | POA: Insufficient documentation

## 2019-07-01 DIAGNOSIS — F419 Anxiety disorder, unspecified: Secondary | ICD-10-CM | POA: Insufficient documentation

## 2019-07-01 DIAGNOSIS — C50919 Malignant neoplasm of unspecified site of unspecified female breast: Secondary | ICD-10-CM | POA: Diagnosis not present

## 2019-07-01 DIAGNOSIS — Z8249 Family history of ischemic heart disease and other diseases of the circulatory system: Secondary | ICD-10-CM | POA: Diagnosis not present

## 2019-07-01 DIAGNOSIS — Z1509 Genetic susceptibility to other malignant neoplasm: Secondary | ICD-10-CM

## 2019-07-01 DIAGNOSIS — Z87891 Personal history of nicotine dependence: Secondary | ICD-10-CM | POA: Insufficient documentation

## 2019-07-01 DIAGNOSIS — Z803 Family history of malignant neoplasm of breast: Secondary | ICD-10-CM | POA: Diagnosis not present

## 2019-07-01 DIAGNOSIS — Z8051 Family history of malignant neoplasm of kidney: Secondary | ICD-10-CM | POA: Diagnosis not present

## 2019-07-01 DIAGNOSIS — Z1502 Genetic susceptibility to malignant neoplasm of ovary: Secondary | ICD-10-CM

## 2019-07-01 DIAGNOSIS — Z79899 Other long term (current) drug therapy: Secondary | ICD-10-CM | POA: Insufficient documentation

## 2019-07-01 DIAGNOSIS — Z170 Estrogen receptor positive status [ER+]: Secondary | ICD-10-CM | POA: Diagnosis not present

## 2019-07-01 DIAGNOSIS — C50411 Malignant neoplasm of upper-outer quadrant of right female breast: Secondary | ICD-10-CM | POA: Diagnosis not present

## 2019-07-01 DIAGNOSIS — Z1589 Genetic susceptibility to other disease: Secondary | ICD-10-CM

## 2019-07-01 LAB — CBC WITH DIFFERENTIAL (CANCER CENTER ONLY)
Abs Immature Granulocytes: 0 10*3/uL (ref 0.00–0.07)
Basophils Absolute: 0 10*3/uL (ref 0.0–0.1)
Basophils Relative: 1 %
Eosinophils Absolute: 0.1 10*3/uL (ref 0.0–0.5)
Eosinophils Relative: 2 %
HCT: 40.4 % (ref 36.0–46.0)
Hemoglobin: 13.2 g/dL (ref 12.0–15.0)
Immature Granulocytes: 0 %
Lymphocytes Relative: 28 %
Lymphs Abs: 1.2 10*3/uL (ref 0.7–4.0)
MCH: 32.3 pg (ref 26.0–34.0)
MCHC: 32.7 g/dL (ref 30.0–36.0)
MCV: 98.8 fL (ref 80.0–100.0)
Monocytes Absolute: 0.3 10*3/uL (ref 0.1–1.0)
Monocytes Relative: 8 %
Neutro Abs: 2.7 10*3/uL (ref 1.7–7.7)
Neutrophils Relative %: 61 %
Platelet Count: 194 10*3/uL (ref 150–400)
RBC: 4.09 MIL/uL (ref 3.87–5.11)
RDW: 12.2 % (ref 11.5–15.5)
WBC Count: 4.3 10*3/uL (ref 4.0–10.5)
nRBC: 0 % (ref 0.0–0.2)

## 2019-07-01 LAB — CMP (CANCER CENTER ONLY)
ALT: 25 U/L (ref 0–44)
AST: 24 U/L (ref 15–41)
Albumin: 3.7 g/dL (ref 3.5–5.0)
Alkaline Phosphatase: 68 U/L (ref 38–126)
Anion gap: 7 (ref 5–15)
BUN: 16 mg/dL (ref 8–23)
CO2: 30 mmol/L (ref 22–32)
Calcium: 9 mg/dL (ref 8.9–10.3)
Chloride: 105 mmol/L (ref 98–111)
Creatinine: 0.85 mg/dL (ref 0.44–1.00)
GFR, Est AFR Am: >60 mL/min (ref >60)
GFR, Estimated: >60 mL/min (ref >60)
Glucose, Bld: 81 mg/dL (ref 70–99)
Potassium: 4.1 mmol/L (ref 3.5–5.1)
Sodium: 142 mmol/L (ref 135–145)
Total Bilirubin: 0.6 mg/dL (ref 0.3–1.2)
Total Protein: 6.5 g/dL (ref 6.5–8.1)

## 2019-07-01 MED ORDER — TAMOXIFEN CITRATE 10 MG PO TABS: 10.0000 mg | ORAL_TABLET | Freq: Two times a day (BID) | ORAL | 4 refills | Status: DC

## 2019-07-23 ENCOUNTER — Ambulatory Visit: Payer: Managed Care, Other (non HMO) | Attending: Internal Medicine

## 2019-07-23 ENCOUNTER — Other Ambulatory Visit: Payer: Self-pay

## 2019-07-23 DIAGNOSIS — Z20822 Contact with and (suspected) exposure to covid-19: Secondary | ICD-10-CM

## 2019-07-25 LAB — NOVEL CORONAVIRUS, NAA: SARS-CoV-2, NAA: NOT DETECTED

## 2019-08-05 ENCOUNTER — Other Ambulatory Visit: Payer: Self-pay

## 2019-08-05 ENCOUNTER — Encounter: Payer: Self-pay | Admitting: Oncology

## 2019-08-05 ENCOUNTER — Ambulatory Visit
Admission: RE | Admit: 2019-08-05 | Discharge: 2019-08-05 | Disposition: A | Discharge status: Home / Self Care | Payer: Managed Care, Other (non HMO) | Source: Ambulatory Visit | Attending: Oncology | Admitting: Oncology

## 2019-08-05 DIAGNOSIS — Z1509 Genetic susceptibility to other malignant neoplasm: Secondary | ICD-10-CM

## 2019-08-05 DIAGNOSIS — C50919 Malignant neoplasm of unspecified site of unspecified female breast: Secondary | ICD-10-CM

## 2019-08-05 DIAGNOSIS — Z170 Estrogen receptor positive status [ER+]: Secondary | ICD-10-CM

## 2019-08-05 DIAGNOSIS — C50411 Malignant neoplasm of upper-outer quadrant of right female breast: Secondary | ICD-10-CM

## 2019-08-05 DIAGNOSIS — Z803 Family history of malignant neoplasm of breast: Secondary | ICD-10-CM

## 2019-08-05 MED ORDER — GADOBUTROL 1 MMOL/ML IV SOLN
7.0000 mL | Freq: Once | INTRAVENOUS | Status: AC | PRN
  Administered 2019-08-05: 7 mL via INTRAVENOUS

## 2019-09-16 ENCOUNTER — Ambulatory Visit: Payer: Managed Care, Other (non HMO) | Attending: Internal Medicine

## 2019-09-16 DIAGNOSIS — Z23 Encounter for immunization: Secondary | ICD-10-CM | POA: Insufficient documentation

## 2019-09-16 NOTE — Progress Notes (Signed)
   Covid-19 Vaccination Clinic  Name:  Tina Nielsen    MRN: [id number] DOB: 12/23/1950  09/16/2019  [name] was observed post Covid-19 immunization for 15 minutes without incidence. She was provided with Vaccine Information Sheet and instruction to access the V-Safe system.   [name] was instructed to call 911 with any severe reactions post vaccine: Marland Kitchen Difficulty breathing  . Swelling of your face and throat  . A fast heartbeat  . A bad rash all over your body  . Dizziness and weakness    Immunizations Administered    Name Date Dose VIS Date Route   Pfizer COVID-19 Vaccine 09/16/2019  3:31 PM 0.3 mL 07/18/2019 Intramuscular   Manufacturer: [place]   Lot: [id number]   [place]: [id number]

## 2019-09-18 ENCOUNTER — Ambulatory Visit: Payer: Managed Care, Other (non HMO)

## 2019-10-11 ENCOUNTER — Ambulatory Visit: Payer: Managed Care, Other (non HMO) | Attending: Internal Medicine

## 2019-10-11 DIAGNOSIS — Z23 Encounter for immunization: Secondary | ICD-10-CM | POA: Insufficient documentation

## 2019-10-11 NOTE — Progress Notes (Signed)
   Covid-19 Vaccination Clinic  Name:  Tina Nielsen    MRN: [id number] DOB: Oct 26, 1950  10/11/2019  [name] was observed post Covid-19 immunization for 15 minutes without incident. She was provided with Vaccine Information Sheet and instruction to access the V-Safe system.   [name] was instructed to call 911 with any severe reactions post vaccine: Marland Kitchen Difficulty breathing  . Swelling of face and throat  . A fast heartbeat  . A bad rash all over body  . Dizziness and weakness   Immunizations Administered    Name Date Dose VIS Date Route   Pfizer COVID-19 Vaccine 10/11/2019  9:32 AM 0.3 mL 07/18/2019 Intramuscular   Manufacturer: [place]   Lot: [id number]   [place]: [id number]

## 2019-10-27 ENCOUNTER — Other Ambulatory Visit: Payer: Self-pay

## 2019-10-28 ENCOUNTER — Encounter: Payer: Self-pay | Admitting: Women's Health

## 2019-10-28 ENCOUNTER — Ambulatory Visit (INDEPENDENT_AMBULATORY_CARE_PROVIDER_SITE_OTHER): Payer: Managed Care, Other (non HMO) | Admitting: Women's Health

## 2019-10-28 VITALS — BP 122/80 | Ht 65.0 in | Wt 143.0 lb

## 2019-10-28 DIAGNOSIS — Z01419 Encounter for gynecological examination (general) (routine) without abnormal findings: Secondary | ICD-10-CM

## 2019-10-28 NOTE — Patient Instructions (Addendum)
Vitamin D 2000 IUs daily Been a pleasure knowing you! [facility]  [id number]  Dr Carlean Purl Health Maintenance After Age 70 After age 83, you are at a higher risk for certain long-term diseases and infections as well as injuries from falls. Falls are a major cause of broken bones and head injuries in people who are older than age 16. Getting regular preventive care can help to keep you healthy and well. Preventive care includes getting regular testing and making lifestyle changes as recommended by your health care provider. Talk with your health care provider about:  Which screenings and tests you should have. A screening is a test that checks for a disease when you have no symptoms.  A diet and exercise plan that is right for you. What should I know about screenings and tests to prevent falls? Screening and testing are the best ways to find a health problem early. Early diagnosis and treatment give you the best chance of managing medical conditions that are common after age 18. Certain conditions and lifestyle choices may make you more likely to have a fall. Your health care provider may recommend:  Regular vision checks. Poor vision and conditions such as cataracts can make you more likely to have a fall. If you wear glasses, make sure to get your prescription updated if your vision changes.  Medicine review. Work with your health care provider to regularly review all of the medicines you are taking, including over-the-counter medicines. Ask your health care provider about any side effects that may make you more likely to have a fall. Tell your health care provider if any medicines that you take make you feel dizzy or sleepy.  Osteoporosis screening. Osteoporosis is a condition that causes the bones to get weaker. This can make the bones weak and cause them to break more easily.  Blood pressure screening. Blood pressure changes and medicines to control blood pressure can make you feel  dizzy.  Strength and balance checks. Your health care provider may recommend certain tests to check your strength and balance while standing, walking, or changing positions.  Foot health exam. Foot pain and numbness, as well as not wearing proper footwear, can make you more likely to have a fall.  Depression screening. You may be more likely to have a fall if you have a fear of falling, feel emotionally low, or feel unable to do activities that you used to do.  Alcohol use screening. Using too much alcohol can affect your balance and may make you more likely to have a fall. What actions can I take to lower my risk of falls? General instructions  Talk with your health care provider about your risks for falling. Tell your health care provider if: ? You fall. Be sure to tell your health care provider about all falls, even ones that seem minor. ? You feel dizzy, sleepy, or off-balance.  Take over-the-counter and prescription medicines only as told by your health care provider. These include any supplements.  Eat a healthy diet and maintain a healthy weight. A healthy diet includes low-fat dairy products, low-fat (lean) meats, and fiber from whole grains, beans, and lots of fruits and vegetables. Home safety  Remove any tripping hazards, such as rugs, cords, and clutter.  Install safety equipment such as grab bars in bathrooms and safety rails on stairs.  Keep rooms and walkways well-lit. Activity   Follow a regular exercise program to stay fit. This will help you maintain your balance. Ask your health  care provider what types of exercise are appropriate for you.  If you need a cane or walker, use it as recommended by your health care provider.  Wear supportive shoes that have nonskid soles. Lifestyle  Do not drink alcohol if your health care provider tells you not to drink.  If you drink alcohol, limit how much you have: ? 0-1 drink a day for women. ? 0-2 drinks a day for  men.  Be aware of how much alcohol is in your drink. In the [place] one drink equals one typical bottle of beer (12 oz), one-half glass of wine (5 oz), or one shot of hard liquor (1 oz).  Do not use any products that contain nicotine or tobacco, such as cigarettes and e-cigarettes. If you need help quitting, ask your health care provider. Summary  Having a healthy lifestyle and getting preventive care can help to protect your health and wellness after age 56.  Screening and testing are the best way to find a health problem early and help you avoid having a fall. Early diagnosis and treatment give you the best chance for managing medical conditions that are more common for people who are older than age 17.  Falls are a major cause of broken bones and head injuries in people who are older than age 2. Take precautions to prevent a fall at home.  Work with your health care provider to learn what changes you can make to improve your health and wellness and to prevent falls. This information is not intended to replace advice given to you by your health care provider. Make sure you discuss any questions you have with your health care provider. Document Revised: 11/14/2018 Document Reviewed: 06/06/2017 Elsevier Patient Education  2020 [employer].

## 2019-10-28 NOTE — Progress Notes (Signed)
Tina Nielsen [birth year] [id number]    History:    Presents for annual exam.  Postmenopausal on no HRT with no bleeding.  Normal Pap history.  09/2015 right breast cancer ER/PR positive HER-2 negative CHEK2 positive lumpectomy, chemo and radiation currently on tamoxifen half tablet daily was not able to tolerate full dose with numerous hot flashes and increased vaginal discharge. Negative breast MRI 07/2019 has them twice yearly.   10/2016 had spotting negative endometrial biopsy and negative sonohysterogram.  2014 - colonoscopy 5-year follow-up will schedule follow-up.  Has had the Covid vaccine.  DEXA 2020 showed improvement in spine,  Hips stable T score -2.1, FRAX 17% / 2.9%.  Not sexually active mutual decision both reports good relationship with husband.  Current on vaccines.  Past medical history, past surgical history, family history and social history were all reviewed and documented in the EPIC chart.  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  2 sons 1 lives in [place] 1 in [place] has a son.    ROS:  A ROS was performed and pertinent positives and negatives are included.  Exam:  Vitals:   10/28/19 1550  BP: 122/80  Weight: 143 lb (64.9 kg)  Height: 5' 5"  (1.651 m)   Body mass index is 23.8 kg/m.   General appearance:  Normal Thyroid:  Symmetrical, normal in size, without palpable masses or nodularity. Respiratory  Auscultation:  Clear without wheezing or rhonchi Cardiovascular  Auscultation:  Regular rate, without rubs, murmurs or gallops  Edema/varicosities:  Not grossly evident Abdominal  Soft,nontender, without masses, guarding or rebound.  Liver/spleen:  No organomegaly noted  Hernia:  None appreciated  Skin  Inspection:  Grossly normal   Breasts: Examined lying and sitting.     Right: Without masses, retractions, discharge or axillary adenopathy.     Left: Without masses, retractions, discharge or axillary adenopathy. Gentitourinary   Inguinal/mons:  Normal without  inguinal adenopathy  External genitalia:  Normal  BUS/Urethra/Skene's glands:  Normal  Vagina: Mild atrophy, +1 rectocele/ asymptomatic   Cervix:  Normal  Uterus:  normal in size, shape and contour.  Midline and mobile  Adnexa/parametria:     Rt: Without masses or tenderness.   Lt: Without masses or tenderness.  Anus and perineum: Normal  Digital rectal exam: Normal sphincter tone without palpated masses or tenderness  Assessment/Plan:  69 y.o. MWF G2, P2 for annual exam with no complaints.  Postmenopausal no HRT with no bleeding 09/2015 right breast cancer ER/PR positive HER-2 negative CHEK2 positive on tamoxifen oncologist manages/07/2019 - breast MRI 2020 osteopenia without elevated FRAX Hypercholesteremia, anxiety/depression-primary care manages labs and meds  Plan: Aware is due for follow-up colonoscopy will schedule.  SBEs, continue twice yearly breast MRIs as recommended with mammograms.  Encouraged to increase regular weightbearing and balance type exercise, yoga encouraged.  Self-care, leisure activities encouraged.  Pap screening guidelines  reviewed no Pap.    Huel Cote [facility], 4:01 PM 10/28/2019

## 2019-10-30 LAB — URINALYSIS, COMPLETE W/RFL CULTURE
Bacteria, UA: NONE SEEN /HPF
Bilirubin Urine: NEGATIVE
Glucose, UA: NEGATIVE
Hgb urine dipstick: NEGATIVE
Hyaline Cast: NONE SEEN /LPF
Nitrites, Initial: NEGATIVE
Protein, ur: NEGATIVE
Specific Gravity, Urine: 1.021 (ref 1.001–1.03)
pH: 6.5 (ref 5.0–8.0)

## 2019-10-30 LAB — URINE CULTURE
MICRO NUMBER:: 10285376
SPECIMEN QUALITY:: ADEQUATE

## 2019-10-30 LAB — CULTURE INDICATED

## 2020-01-07 ENCOUNTER — Other Ambulatory Visit: Payer: Self-pay | Admitting: *Deleted

## 2020-01-08 ENCOUNTER — Telehealth: Payer: Self-pay | Admitting: Oncology

## 2020-01-08 NOTE — Telephone Encounter (Signed)
Scheduled per 6/2 sch message. Unable to reach pt. Left voicemail- appts on 8/3

## 2020-03-09 ENCOUNTER — Ambulatory Visit: Payer: Managed Care, Other (non HMO) | Admitting: Oncology

## 2020-03-09 ENCOUNTER — Other Ambulatory Visit: Payer: Managed Care, Other (non HMO)

## 2020-03-16 ENCOUNTER — Other Ambulatory Visit: Payer: Managed Care, Other (non HMO)

## 2020-03-16 ENCOUNTER — Ambulatory Visit: Payer: Managed Care, Other (non HMO) | Admitting: Oncology

## 2020-04-13 DIAGNOSIS — D224 Melanocytic nevi of scalp and neck: Secondary | ICD-10-CM | POA: Diagnosis not present

## 2020-04-13 DIAGNOSIS — D2262 Melanocytic nevi of left upper limb, including shoulder: Secondary | ICD-10-CM | POA: Diagnosis not present

## 2020-04-13 DIAGNOSIS — L814 Other melanin hyperpigmentation: Secondary | ICD-10-CM | POA: Diagnosis not present

## 2020-04-13 DIAGNOSIS — D1801 Hemangioma of skin and subcutaneous tissue: Secondary | ICD-10-CM | POA: Diagnosis not present

## 2020-04-13 DIAGNOSIS — L821 Other seborrheic keratosis: Secondary | ICD-10-CM | POA: Diagnosis not present

## 2020-04-16 DIAGNOSIS — Z1501 Genetic susceptibility to malignant neoplasm of breast: Secondary | ICD-10-CM | POA: Diagnosis not present

## 2020-04-16 DIAGNOSIS — Z1211 Encounter for screening for malignant neoplasm of colon: Secondary | ICD-10-CM | POA: Diagnosis not present

## 2020-04-16 DIAGNOSIS — Z1509 Genetic susceptibility to other malignant neoplasm: Secondary | ICD-10-CM | POA: Diagnosis not present

## 2020-04-29 ENCOUNTER — Other Ambulatory Visit: Payer: Managed Care, Other (non HMO)

## 2020-04-29 ENCOUNTER — Other Ambulatory Visit: Payer: Self-pay

## 2020-04-29 ENCOUNTER — Ambulatory Visit: Payer: Managed Care, Other (non HMO) | Admitting: Adult Health

## 2020-04-29 DIAGNOSIS — Z170 Estrogen receptor positive status [ER+]: Secondary | ICD-10-CM

## 2020-04-30 ENCOUNTER — Other Ambulatory Visit: Payer: Self-pay

## 2020-04-30 ENCOUNTER — Inpatient Hospital Stay: Payer: Medicare HMO | Attending: Adult Health

## 2020-04-30 ENCOUNTER — Encounter: Payer: Self-pay | Admitting: Adult Health

## 2020-04-30 ENCOUNTER — Inpatient Hospital Stay (HOSPITAL_BASED_OUTPATIENT_CLINIC_OR_DEPARTMENT_OTHER): Payer: Medicare HMO | Admitting: Adult Health

## 2020-04-30 VITALS — BP 112/65 | HR 60 | Temp 97.8°F | Resp 18 | Ht 65.0 in | Wt 136.6 lb

## 2020-04-30 DIAGNOSIS — Z170 Estrogen receptor positive status [ER+]: Secondary | ICD-10-CM | POA: Insufficient documentation

## 2020-04-30 DIAGNOSIS — Z7951 Long term (current) use of inhaled steroids: Secondary | ICD-10-CM | POA: Insufficient documentation

## 2020-04-30 DIAGNOSIS — Z8249 Family history of ischemic heart disease and other diseases of the circulatory system: Secondary | ICD-10-CM | POA: Diagnosis not present

## 2020-04-30 DIAGNOSIS — Z79810 Long term (current) use of selective estrogen receptor modulators (SERMs): Secondary | ICD-10-CM | POA: Insufficient documentation

## 2020-04-30 DIAGNOSIS — Z1509 Genetic susceptibility to other malignant neoplasm: Secondary | ICD-10-CM

## 2020-04-30 DIAGNOSIS — Z8042 Family history of malignant neoplasm of prostate: Secondary | ICD-10-CM | POA: Insufficient documentation

## 2020-04-30 DIAGNOSIS — C50411 Malignant neoplasm of upper-outer quadrant of right female breast: Secondary | ICD-10-CM

## 2020-04-30 DIAGNOSIS — R69 Illness, unspecified: Secondary | ICD-10-CM | POA: Diagnosis not present

## 2020-04-30 DIAGNOSIS — C50919 Malignant neoplasm of unspecified site of unspecified female breast: Secondary | ICD-10-CM

## 2020-04-30 DIAGNOSIS — Z803 Family history of malignant neoplasm of breast: Secondary | ICD-10-CM | POA: Insufficient documentation

## 2020-04-30 DIAGNOSIS — Z8349 Family history of other endocrine, nutritional and metabolic diseases: Secondary | ICD-10-CM | POA: Insufficient documentation

## 2020-04-30 DIAGNOSIS — Z8051 Family history of malignant neoplasm of kidney: Secondary | ICD-10-CM | POA: Insufficient documentation

## 2020-04-30 DIAGNOSIS — Z1502 Genetic susceptibility to malignant neoplasm of ovary: Secondary | ICD-10-CM

## 2020-04-30 DIAGNOSIS — Z87891 Personal history of nicotine dependence: Secondary | ICD-10-CM | POA: Insufficient documentation

## 2020-04-30 DIAGNOSIS — Z1589 Genetic susceptibility to other disease: Secondary | ICD-10-CM | POA: Diagnosis not present

## 2020-04-30 DIAGNOSIS — Z79899 Other long term (current) drug therapy: Secondary | ICD-10-CM | POA: Insufficient documentation

## 2020-04-30 DIAGNOSIS — Z923 Personal history of irradiation: Secondary | ICD-10-CM | POA: Diagnosis not present

## 2020-04-30 DIAGNOSIS — F419 Anxiety disorder, unspecified: Secondary | ICD-10-CM | POA: Insufficient documentation

## 2020-04-30 LAB — CMP (CANCER CENTER ONLY)
ALT: 17 U/L (ref 0–44)
AST: 20 U/L (ref 15–41)
Albumin: 3.6 g/dL (ref 3.5–5.0)
Alkaline Phosphatase: 55 U/L (ref 38–126)
Anion gap: 7 (ref 5–15)
BUN: 17 mg/dL (ref 8–23)
CO2: 29 mmol/L (ref 22–32)
Calcium: 8.8 mg/dL — ABNORMAL LOW (ref 8.9–10.3)
Chloride: 102 mmol/L (ref 98–111)
Creatinine: 0.86 mg/dL (ref 0.44–1.00)
GFR, Est AFR Am: >60 mL/min (ref >60)
GFR, Estimated: >60 mL/min (ref >60)
Glucose, Bld: 81 mg/dL (ref 70–99)
Potassium: 3.8 mmol/L (ref 3.5–5.1)
Sodium: 138 mmol/L (ref 135–145)
Total Bilirubin: 0.5 mg/dL (ref 0.3–1.2)
Total Protein: 6.6 g/dL (ref 6.5–8.1)

## 2020-04-30 LAB — CBC WITH DIFFERENTIAL (CANCER CENTER ONLY)
Abs Immature Granulocytes: 0.05 10*3/uL (ref 0.00–0.07)
Basophils Absolute: 0.1 10*3/uL (ref 0.0–0.1)
Basophils Relative: 1 %
Eosinophils Absolute: 0.1 10*3/uL (ref 0.0–0.5)
Eosinophils Relative: 1 %
HCT: 36.9 % (ref 36.0–46.0)
Hemoglobin: 12 g/dL (ref 12.0–15.0)
Immature Granulocytes: 1 %
Lymphocytes Relative: 29 %
Lymphs Abs: 2 10*3/uL (ref 0.7–4.0)
MCH: 31.7 pg (ref 26.0–34.0)
MCHC: 32.5 g/dL (ref 30.0–36.0)
MCV: 97.6 fL (ref 80.0–100.0)
Monocytes Absolute: 0.5 10*3/uL (ref 0.1–1.0)
Monocytes Relative: 7 %
Neutro Abs: 4.3 10*3/uL (ref 1.7–7.7)
Neutrophils Relative %: 61 %
Platelet Count: 203 10*3/uL (ref 150–400)
RBC: 3.78 MIL/uL — ABNORMAL LOW (ref 3.87–5.11)
RDW: 12.1 % (ref 11.5–15.5)
WBC Count: 6.9 10*3/uL (ref 4.0–10.5)
nRBC: 0 % (ref 0.0–0.2)

## 2020-04-30 MED ORDER — TAMOXIFEN CITRATE 10 MG PO TABS: 10.0000 mg | ORAL_TABLET | Freq: Two times a day (BID) | ORAL | 4 refills | Status: DC

## 2020-04-30 NOTE — Progress Notes (Signed)
[place]  Telephone:([phone]) [phone] Fax:(336) [phone]    ID: Tina Nielsen DOB: 1950-08-10  MR#: [id number]  OVZ#:[id number]  Patient Care Team: Tina Small, MD as PCP - General (Family Medicine) Tina Nielsen, Tina Dad, MD as Consulting Physician (Oncology) Tina Bookbinder, MD as Consulting Physician (General Surgery) Tina Silversmith, MD as Consulting Physician (Radiation Oncology) Tina Slates, MD as Physician Assistant (Radiology) Nielsen, Tina Block, MD (Inactive) as Consulting Physician (Gynecology) Tina Cote, NP (Inactive) as Nurse Practitioner (Obstetrics and Gynecology) OTHER MD:  CHIEF COMPLAINT: Estrogen receptor positive breast cancer  CURRENT TREATMENT:  Tamoxifen (10 mg)   INTERVAL HISTORY: "Tina Nielsen" returns today for follow-up and treatment of her CHEK2-associated estrogen receptor positive breast cancer.  She was unable to tolerate exemestane.  She was started on Tamoxifen at her last visit at a decreased dose of 80m daily.  She is taking that BID and notes that she tolerates it well other than vaginal discharge.  She underwent an MRI on 08/05/2019 that showed no breast malignancy and right breast post treatment changes.  She was due for mammogram in 01/2020 with [facility].  We do not have those records, however she tells me it is normal.     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  She is watching her grandson who is 116 monthsold one day per week as well.  She walks some.  Tina Sendersees her PCP regularly and is up to date with skin cancer screening.  She has a colonoscopy scheduled for end of October, 2021.  She denies any new issues and a detailed ROS was otherwise non contributory.    BREAST CANCER HISTORY:  From the original intake note:  "Tina Nielsen" noted a possible lump in her left breast and [facility] The breast  composition was category D. There was no finding in the left breast to correlate with the patient's lower inner quadrant concern. Ultrasound was obtained the same day and was likewise unremarkable.   However in the right breast upper outer quadrant posteriorly there was a 4 mm area of grouped calcifications. This was felt to warrant biopsy, which was performed stereotactically 09/01/2015. The final pathology (SAA [id number] showed an invasive ductal carcinoma, grade 1, estrogen receptor 100% positive, progesterone receptor 90% positive, both with strong staining intensity, with an MIB-1 of 10%, and no HER-2 amplification, the signals ratio being 1.17 and the number per cell 2.35.  Bilateral breast MRI was obtained 09/06/2015. This describes the breast density as category C. In the upper outer quadrant of the right breast there was a clip. There was no suspicious surrounding enhancement. More laterally there was a 5.1 cm fluid collection compatible with a hematoma. The left breast was normal and there was no abnormal appearing adenopathy.   Her subsequent history is as detailed below.   PAST MEDICAL HISTORY: Past Medical History:  Diagnosis Date  . Anxiety   . Arthritis    knees  . Atrophic vaginitis   . [facility] [place] [facility] [place] [place]  . Family history of breast cancer   . High cholesterol   . Osteopenia     PAST SURGICAL HISTORY: Past Surgical History:  Procedure Laterality Date  . BREAST LUMPECTOMY WITH NEEDLE LOCALIZATION AND AXILLARY SENTINEL LYMPH NODE BX Right 09/30/2015   Procedure: RIGHT BREAST LUMPECTOMY WITH NEEDLE LOCALIZATION AND AXILLARY SENTINEL LYMPH NODE BX;  Surgeon: Tina Bookbinder, MD;  Location: [facility];  Service: General;  Laterality: Right;  . BREAST SURGERY     BIOPSY  . KNEE SURGERY    . TUBAL LIGATION      FAMILY HISTORY Family History  Problem Relation Age of Onset  . Hypertension Mother   .  Breast cancer Mother 48       second cancer at 65  . Heart disease Mother   . Aneurysm Father        AORTIC  . Heart disease Maternal Grandfather   . Kidney cancer Maternal Aunt 89  . Prostate cancer Paternal Uncle 32  . Stroke Maternal Grandmother   . Breast cancer Other        MGMs sister  . Breast cancer Other        PGMs sister  . Breast cancer Other        PGF sister  . Colon cancer Neg Hx    The patient's father died from a ruptured aortic aneurysm the age of 7. The patient's mother was diagnosed with breast cancer at age 36. She had a second breast cancer diagnosed at age 23.She died at age 25. The patient had one brother, no sisters. As far as she is aware that there are no other breast or ovarian cancer instances in the family   GYNECOLOGIC HISTORY:  No LMP recorded. Patient is postmenopausal. Menarche age 31, first live birth age 35, which the patient knows increases the risk of breast cancer. She is GX P2. She stopped having periods around age 31. She did not take hormone replacement. 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 Her husband [name] taught history as well as coached basketball at the same school. He is now retired but is very active in the [employer]. He knows [name] well and has quite a few stories to tell regarding [facility] basketball. Their son [name] lives in [place] and works in Designer, television/film set as a Environmental manager. Son [name] lives in [place] working as a Designer, jewellery. The children have been made aware of the CHEK2 mutation and are considering testing.The patient has no grandchildren.    ADVANCED DIRECTIVES: In place   HEALTH MAINTENANCE: Social History   Tobacco Use  . Smoking status: Former Smoker    Quit date: 08/07/1976    Years since quitting: 43.7  . Smokeless tobacco: Never Used  Vaping Use  .  Vaping Use: Never used  Substance Use Topics  . Alcohol use: Yes    Alcohol/week: 7.0 standard drinks    Types: 7 Glasses of wine per week  . Drug use: No     Colonoscopy: 2014/[facility]  PAP:  Bone density: June 2018, -2.0 osteopenia  Lipid panel:  Allergies  Allergen Reactions  . Aleve [Naproxen] Other (See Comments)    "stomach irritation"  . Celebrex [Celecoxib]     Irritated stomach- caused burning  . Ibuprofen     "UPSET STOMACH"- burning    Current Outpatient Medications  Medication Sig Dispense Refill  . acetaminophen (TYLENOL) 500 MG tablet Take 500 mg by mouth every 6 (six) hours as needed.    . Calcium Carbonate-Vitamin D (CALCIUM + D PO) Take by mouth.      . escitalopram (LEXAPRO) 10 MG tablet Take 1 tablet by mouth daily.    . Glucosamine-Chondroitin (GLUCOSAMINE CHONDR COMPLEX  PO) Take by mouth.      . mometasone (NASONEX) 50 MCG/ACT nasal spray Place 2 sprays into the nose daily.    . Multiple Vitamin (MULTIVITAMIN) capsule Take 1 capsule by mouth daily.      . pravastatin (PRAVACHOL) 80 MG tablet Take 1 tablet (80 mg total) by mouth daily.    . tamoxifen (NOLVADEX) 10 MG tablet Take 1 tablet (10 mg total) by mouth 2 (two) times daily. 90 tablet 4  . VITAMIN E PO Take by mouth.       No current facility-administered medications for this visit.    OBJECTIVE:  Vitals:   04/30/20 1432  BP: 112/65  Pulse: 60  Resp: 18  Temp: 97.8 F (36.6 C)  SpO2: 98%     Body mass index is 22.73 kg/m.    ECOG FS:1 - Symptomatic but completely ambulatory GENERAL: Patient is a well appearing female in no acute distress HEENT:  Sclerae anicteric.  Mask in place. Neck is supple.  NODES:  No cervical, supraclavicular, or axillary lymphadenopathy palpated.  BREAST EXAM:   LUNGS:  Clear to auscultation bilaterally.  No wheezes or rhonchi. HEART:  Regular rate and rhythm. No murmur appreciated. ABDOMEN:  Soft, nontender.  Positive, normoactive bowel sounds. No organomegaly  palpated. MSK:  No focal spinal tenderness to palpation. Full range of motion bilaterally in the upper extremities. EXTREMITIES:  No peripheral edema.   SKIN:  Clear with no obvious rashes or skin changes. No nail dyscrasia. NEURO:  Nonfocal. Well oriented.  Appropriate affect.    LAB RESULTS:  CMP     Component Value Date/Time   NA 142 07/01/2019 0940   NA 140 12/07/2016 1504   K 4.1 07/01/2019 0940   K 3.8 12/07/2016 1504   CL 105 07/01/2019 0940   CO2 30 07/01/2019 0940   CO2 28 12/07/2016 1504   GLUCOSE 81 07/01/2019 0940   GLUCOSE 99 12/07/2016 1504   BUN 16 07/01/2019 0940   BUN 16.3 12/07/2016 1504   CREATININE 0.85 07/01/2019 0940   CREATININE 0.8 12/07/2016 1504   CALCIUM 9.0 07/01/2019 0940   CALCIUM 8.9 12/07/2016 1504   PROT 6.5 07/01/2019 0940   PROT 6.5 12/07/2016 1504   ALBUMIN 3.7 07/01/2019 0940   ALBUMIN 3.6 12/07/2016 1504   AST 24 07/01/2019 0940   AST 21 12/07/2016 1504   ALT 25 07/01/2019 0940   ALT 18 12/07/2016 1504   ALKPHOS 68 07/01/2019 0940   ALKPHOS 50 12/07/2016 1504   BILITOT 0.6 07/01/2019 0940   BILITOT 0.61 12/07/2016 1504   GFRNONAA >60 07/01/2019 0940   GFRAA >60 07/01/2019 0940    INo results found for: SPEP, UPEP  Lab Results  Component Value Date   WBC 6.9 04/30/2020   NEUTROABS 4.3 04/30/2020   HGB 12.0 04/30/2020   HCT 36.9 04/30/2020   MCV 97.6 04/30/2020   PLT 203 04/30/2020      Chemistry      Component Value Date/Time   NA 142 07/01/2019 0940   NA 140 12/07/2016 1504   K 4.1 07/01/2019 0940   K 3.8 12/07/2016 1504   CL 105 07/01/2019 0940   CO2 30 07/01/2019 0940   CO2 28 12/07/2016 1504   BUN 16 07/01/2019 0940   BUN 16.3 12/07/2016 1504   CREATININE 0.85 07/01/2019 0940   CREATININE 0.8 12/07/2016 1504      Component Value Date/Time   CALCIUM 9.0 07/01/2019 0940   CALCIUM 8.9 12/07/2016 1504  ALKPHOS 68 07/01/2019 0940   ALKPHOS 50 12/07/2016 1504   AST 24 07/01/2019 0940   AST 21 12/07/2016  1504   ALT 25 07/01/2019 0940   ALT 18 12/07/2016 1504   BILITOT 0.6 07/01/2019 0940   BILITOT 0.61 12/07/2016 1504       No results found for: LABCA2  No components found for: LABCA125  No results for input(s): INR in the last 168 hours.  Urinalysis    Component Value Date/Time   COLORURINE YELLOW 10/28/2019 1604   APPEARANCEUR CLEAR 10/28/2019 1604   LABSPEC 1.021 10/28/2019 1604   PHURINE 6.5 10/28/2019 1604   GLUCOSEU NEGATIVE 10/28/2019 1604   HGBUR NEGATIVE 10/28/2019 1604   BILIRUBINUR NEGATIVE 01/23/2017 1628   KETONESUR TRACE (A) 10/28/2019 1604   PROTEINUR NEGATIVE 10/28/2019 1604   UROBILINOGEN 0.2 02/10/2014 0846   NITRITE NEGATIVE 01/23/2017 1628   LEUKOCYTESUR NEGATIVE 01/23/2017 1628    ELIGIBLE FOR AVAILABLE RESEARCH PROTOCOL: no   STUDIES: No results found.   ASSESSMENT: 69 y.o. [place] woman status post right breast upper outer quadrant biopsy 09/01/2015 for a clinical T1a N0, stage IA invasive ductal carcinoma, grade 1, estrogen and progesterone receptor positive, HER-2 not amplified, with an MIB-1 of 10%  (1) genetics testing 10/19/2015 through the Breast/Ovarian gene panel offered by [facility] found no deleterious mutations in ATM, BARD1, BRCA1, BRCA2, BRIP1, CDH1, EPCAM, FANCC, MLH1, MSH2, MSH6, NBN, PALB2, PMS2, PTEN, RAD51C, RAD51D, TP53, and XRCC2.  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   (b) Genetic testing also detected a Variant of Unknown Significance in the BRCA2 gene called [id number].  (c) breast cancer intensified screening will consist of breast MRI in July and breast mammography in January yearly  (d) colon cancer screening will consist of colonoscopy every 5 years or more often depending on findings   (i) most recent colonoscopy 07/28/2013; no polyps noted  (2) status post right lumpectomy with sentinel lymph node sampling 09/30/2015 for a pTis pN0, stage 0 ductal carcinoma  in situ, grade 2, with negative margins  (3) adjuvant radiation (mammosite) completed May 2017  (4) started tamoxifen 01/06/2016, switched to anastrozole starting 12/06/2018  (a) endometrial biopsy 10/11/2016 shows scant tissue with an atrophic pattern.  (b) switched from anastrozole to exemestane August 2020 due to side effects  (c) discontinued exemestane October 2020 due to side effects  (d) resumed tamoxifen now at 10 mg daily beginning 07/01/2019  (5) bone density at [facility] 01/22/2017 shows a T score of -2.0  (a) repeat bone density 05/16/2019 at [facility] shows a T score of -2.0 (stable)  (6) colonoscopy has been postponed secondary to the pandemic  (a) tentative plan is to refer back to GI June 2021 to renew screening   PLAN: Tina Nielsen is here today for f/u of her estrogen positive breast cancer.  She has no clinical or radiographic sign of breast cancer recurrence.  She continues on Tamoxifen and is tolerating it well other than her vaginal discharge.  She will continue this.    Tina Nielsen will continue healthy diet and exercise.  Her MRI is due in 07/2020 and I placed orders for this today.  She is chek2 positive and requires intensified screening.  She is also keeping up to date with her PCP visits and cancer screenings.  I recommended she continue this.    Tina Nielsen will see Dr. Donne Hazel in 6 months and [name] in one year.  She knows to call for any questions  that may arise between now and her next appointment.  We are happy to see her sooner if needed.  Total encounter time: 20 minutes*  Wilber Bihari, NP 04/30/20 2:41 PM Medical Oncology and Hematology [facility] [place], [place] [address] Tel. [phone]    Fax. [phone]  *Total Encounter Time as defined by the Centers for Medicare and Medicaid Services includes, in addition to the face-to-face time of a patient visit (documented in the note above) non-face-to-face time: obtaining and reviewing  outside history, ordering and reviewing medications, tests or procedures, care coordination (communications with other health care professionals or caregivers) and documentation in the medical record.

## 2020-05-03 ENCOUNTER — Telehealth: Payer: Self-pay | Admitting: Adult Health

## 2020-05-03 NOTE — Telephone Encounter (Signed)
Scheduled appts per 9/24 los. Left voicemail with appt date and time.

## 2020-05-05 ENCOUNTER — Encounter: Payer: Self-pay | Admitting: Adult Health

## 2020-05-05 DIAGNOSIS — R69 Illness, unspecified: Secondary | ICD-10-CM | POA: Diagnosis not present

## 2020-06-01 DIAGNOSIS — Z1159 Encounter for screening for other viral diseases: Secondary | ICD-10-CM | POA: Diagnosis not present

## 2020-06-02 DIAGNOSIS — R69 Illness, unspecified: Secondary | ICD-10-CM | POA: Diagnosis not present

## 2020-06-04 DIAGNOSIS — K648 Other hemorrhoids: Secondary | ICD-10-CM | POA: Diagnosis not present

## 2020-06-04 DIAGNOSIS — K635 Polyp of colon: Secondary | ICD-10-CM | POA: Diagnosis not present

## 2020-06-04 DIAGNOSIS — Z1211 Encounter for screening for malignant neoplasm of colon: Secondary | ICD-10-CM | POA: Diagnosis not present

## 2020-06-08 DIAGNOSIS — K635 Polyp of colon: Secondary | ICD-10-CM | POA: Diagnosis not present

## 2020-06-30 ENCOUNTER — Other Ambulatory Visit: Payer: Managed Care, Other (non HMO)

## 2020-06-30 ENCOUNTER — Ambulatory Visit: Payer: Managed Care, Other (non HMO) | Admitting: Oncology

## 2020-07-26 ENCOUNTER — Ambulatory Visit
Admission: RE | Admit: 2020-07-26 | Discharge: 2020-07-26 | Disposition: A | Discharge status: Home / Self Care | Payer: Medicare HMO | Source: Ambulatory Visit | Attending: Adult Health | Admitting: Adult Health

## 2020-07-26 DIAGNOSIS — N6489 Other specified disorders of breast: Secondary | ICD-10-CM | POA: Diagnosis not present

## 2020-07-26 DIAGNOSIS — Z170 Estrogen receptor positive status [ER+]: Secondary | ICD-10-CM

## 2020-07-26 DIAGNOSIS — Z1509 Genetic susceptibility to other malignant neoplasm: Secondary | ICD-10-CM

## 2020-07-26 MED ORDER — GADOBUTROL 1 MMOL/ML IV SOLN
6.0000 mL | Freq: Once | INTRAVENOUS | Status: AC | PRN
  Administered 2020-07-26: 6 mL via INTRAVENOUS

## 2020-07-28 ENCOUNTER — Telehealth: Payer: Self-pay

## 2020-07-28 NOTE — Telephone Encounter (Signed)
Left voicemail for return call, VI:[id number]

## 2020-08-25 DIAGNOSIS — C50411 Malignant neoplasm of upper-outer quadrant of right female breast: Secondary | ICD-10-CM | POA: Diagnosis not present

## 2020-11-02 ENCOUNTER — Ambulatory Visit: Payer: Medicare HMO | Admitting: Nurse Practitioner

## 2020-11-02 ENCOUNTER — Other Ambulatory Visit: Payer: Self-pay

## 2020-11-02 ENCOUNTER — Encounter: Payer: Self-pay | Admitting: Nurse Practitioner

## 2020-11-02 VITALS — BP 118/74 | Ht 65.0 in | Wt 135.0 lb

## 2020-11-02 DIAGNOSIS — Z01419 Encounter for gynecological examination (general) (routine) without abnormal findings: Secondary | ICD-10-CM | POA: Diagnosis not present

## 2020-11-02 DIAGNOSIS — M8589 Other specified disorders of bone density and structure, multiple sites: Secondary | ICD-10-CM

## 2020-11-02 DIAGNOSIS — Z853 Personal history of malignant neoplasm of breast: Secondary | ICD-10-CM

## 2020-11-02 NOTE — Progress Notes (Signed)
   Tina Nielsen 11-05-1950 [id number]   History:  70 y.o. G2P0002 presents for breast and pelvic exam. No GYN complaints. Normal pap history. Postmenopausal - no HRT, no bleeding. 2017 right breast cancer ER/PR + HER-2 negative CHEK2 + managed with lumpectomy, radiation, chemo, and tamoxifen. Osteopenia.   Gynecologic History No LMP recorded. Patient is postmenopausal.   Contraception/Family planning: post menopausal status  Health Maintenance Last Pap: No longer screening per guidelines Last breast MRI: 07/26/2020. Results were: normal Last colonoscopy: 2021. Results were: benign polyp, 5-year recall Last Dexa: 05/2019. Results were: t-score -2.1  Past medical history, past surgical history, family history and social history were all reviewed and documented in the EPIC chart. HS math teacher. Mother had breast cancer in her 52s, 49s, and 95s.   ROS:  A ROS was performed and pertinent positives and negatives are included.  Exam:  Vitals:   11/02/20 1601  BP: 118/74  Weight: 135 lb (61.2 kg)  Height: _0  (1.651 m)   Body mass index is 22.47 kg/m.  General appearance:  Normal Thyroid:  Symmetrical, normal in size, without palpable masses or nodularity. Respiratory  Auscultation:  Clear without wheezing or rhonchi Cardiovascular  Auscultation:  Regular rate, without rubs, murmurs or gallops  Edema/varicosities:  Not grossly evident Abdominal  Soft,nontender, without masses, guarding or rebound.  Liver/spleen:  No organomegaly noted  Hernia:  None appreciated  Skin  Inspection:  Grossly normal   Breasts: Examined lying and sitting.   Right: Without masses, retractions, discharge or axillary adenopathy.   Left: Without masses, retractions, discharge or axillary adenopathy. Gentitourinary   Inguinal/mons:  Normal without inguinal adenopathy  External genitalia:  Normal  BUS/Urethra/Skene's glands:  Normal  Vagina:  Atrophic changes  Cervix:  Normal  Uterus:   Normal in size, shape and contour.  Midline and mobile  Adnexa/parametria:     Rt: Without masses or tenderness.   Lt: Without masses or tenderness.  Anus and perineum: Normal  Digital rectal exam: Normal sphincter tone without palpated masses or tenderness  Assessment/Plan:  70 y.o. G2P0002 for breast and pelvic exam.   Well female exam with routine gynecological exam - Education provided on SBEs, importance of preventative screenings, current guidelines, high calcium diet, regular exercise, and multivitamin daily. Labs with PCP.   History of breast cancer - 2017 right breast cancer ER/PR + HER-2 negative CHEK-2 + managed with lumpectomy, radiation, chemo, and tamoxifen. Mammogram and breast MRI annually.   Osteopenia of multiple sites - 2020 T-score -2.1. Continue daily vitamin D supplement and regular exercise.   Screening for cervical cancer - Normal Pap history.  Discussed current guidelines and option to stop screening. She would like pap done next year at 5-year interval and then consider stopping.   Screening for breast cancer - history of breast cancer. Continue annual mammogram and breast MRI. Normal breast exam today.  Screening for colon cancer - 2021 colonoscopy.  Will repeat at GI's recommended interval.   Return in 1 year for annual.    [place], 4:16 PM 11/02/2020

## 2020-11-02 NOTE — Patient Instructions (Signed)
Health Maintenance After Age 71 After age 71, you are at a higher risk for certain long-term diseases and infections as well as injuries from falls. Falls are a major cause of broken bones and head injuries in people who are older than age 71. Getting regular preventive care can help to keep you healthy and well. Preventive care includes getting regular testing and making lifestyle changes as recommended by your health care provider. Talk with your health care provider about:  Which screenings and tests you should have. A screening is a test that checks for a disease when you have no symptoms.  A diet and exercise plan that is right for you. What should I know about screenings and tests to prevent falls? Screening and testing are the best ways to find a health problem early. Early diagnosis and treatment give you the best chance of managing medical conditions that are common after age 71. Certain conditions and lifestyle choices may make you more likely to have a fall. Your health care provider may recommend:  Regular vision checks. Poor vision and conditions such as cataracts can make you more likely to have a fall. If you wear glasses, make sure to get your prescription updated if your vision changes.  Medicine review. Work with your health care provider to regularly review all of the medicines you are taking, including over-the-counter medicines. Ask your health care provider about any side effects that may make you more likely to have a fall. Tell your health care provider if any medicines that you take make you feel dizzy or sleepy.  Osteoporosis screening. Osteoporosis is a condition that causes the bones to get weaker. This can make the bones weak and cause them to break more easily.  Blood pressure screening. Blood pressure changes and medicines to control blood pressure can make you feel dizzy.  Strength and balance checks. Your health care provider may recommend certain tests to check your  strength and balance while standing, walking, or changing positions.  Foot health exam. Foot pain and numbness, as well as not wearing proper footwear, can make you more likely to have a fall.  Depression screening. You may be more likely to have a fall if you have a fear of falling, feel emotionally low, or feel unable to do activities that you used to do.  Alcohol use screening. Using too much alcohol can affect your balance and may make you more likely to have a fall. What actions can I take to lower my risk of falls? General instructions  Talk with your health care provider about your risks for falling. Tell your health care provider if: ? You fall. Be sure to tell your health care provider about all falls, even ones that seem minor. ? You feel dizzy, sleepy, or off-balance.  Take over-the-counter and prescription medicines only as told by your health care provider. These include any supplements.  Eat a healthy diet and maintain a healthy weight. A healthy diet includes low-fat dairy products, low-fat (lean) meats, and fiber from whole grains, beans, and lots of fruits and vegetables. Home safety  Remove any tripping hazards, such as rugs, cords, and clutter.  Install safety equipment such as grab bars in bathrooms and safety rails on stairs.  Keep rooms and walkways well-lit. Activity  Follow a regular exercise program to stay fit. This will help you maintain your balance. Ask your health care provider what types of exercise are appropriate for you.  If you need a cane or walker,   use it as recommended by your health care provider.  Wear supportive shoes that have nonskid soles.   Lifestyle  Do not drink alcohol if your health care provider tells you not to drink.  If you drink alcohol, limit how much you have: ? 0-1 drink a day for women. ? 0-2 drinks a day for men.  Be aware of how much alcohol is in your drink. In the [place] one drink equals one typical bottle of beer (12  oz), one-half glass of wine (5 oz), or one shot of hard liquor (1 oz).  Do not use any products that contain nicotine or tobacco, such as cigarettes and e-cigarettes. If you need help quitting, ask your health care provider. Summary  Having a healthy lifestyle and getting preventive care can help to protect your health and wellness after age 71.  Screening and testing are the best way to find a health problem early and help you avoid having a fall. Early diagnosis and treatment give you the best chance for managing medical conditions that are more common for people who are older than age 71.  Falls are a major cause of broken bones and head injuries in people who are older than age 71. Take precautions to prevent a fall at home.  Work with your health care provider to learn what changes you can make to improve your health and wellness and to prevent falls. This information is not intended to replace advice given to you by your health care provider. Make sure you discuss any questions you have with your health care provider. Document Revised: 11/14/2018 Document Reviewed: 06/06/2017 Elsevier Patient Education  2021 [employer]vier Inc.  

## 2020-12-16 DIAGNOSIS — M25562 Pain in left knee: Secondary | ICD-10-CM | POA: Diagnosis not present

## 2020-12-16 DIAGNOSIS — M170 Bilateral primary osteoarthritis of knee: Secondary | ICD-10-CM | POA: Diagnosis not present

## 2020-12-16 DIAGNOSIS — M1711 Unilateral primary osteoarthritis, right knee: Secondary | ICD-10-CM | POA: Diagnosis not present

## 2020-12-16 DIAGNOSIS — M1712 Unilateral primary osteoarthritis, left knee: Secondary | ICD-10-CM | POA: Diagnosis not present

## 2021-01-05 ENCOUNTER — Other Ambulatory Visit: Payer: Self-pay | Admitting: *Deleted

## 2021-01-05 DIAGNOSIS — Z1509 Genetic susceptibility to other malignant neoplasm: Secondary | ICD-10-CM

## 2021-01-05 DIAGNOSIS — C50411 Malignant neoplasm of upper-outer quadrant of right female breast: Secondary | ICD-10-CM

## 2021-01-05 DIAGNOSIS — C50919 Malignant neoplasm of unspecified site of unspecified female breast: Secondary | ICD-10-CM

## 2021-01-05 DIAGNOSIS — Z803 Family history of malignant neoplasm of breast: Secondary | ICD-10-CM

## 2021-01-05 DIAGNOSIS — Z170 Estrogen receptor positive status [ER+]: Secondary | ICD-10-CM

## 2021-02-08 DIAGNOSIS — Z5181 Encounter for therapeutic drug level monitoring: Secondary | ICD-10-CM | POA: Diagnosis not present

## 2021-02-08 DIAGNOSIS — E785 Hyperlipidemia, unspecified: Secondary | ICD-10-CM | POA: Diagnosis not present

## 2021-02-14 DIAGNOSIS — R922 Inconclusive mammogram: Secondary | ICD-10-CM | POA: Diagnosis not present

## 2021-02-14 DIAGNOSIS — Z853 Personal history of malignant neoplasm of breast: Secondary | ICD-10-CM | POA: Diagnosis not present

## 2021-03-01 DIAGNOSIS — D0510 Intraductal carcinoma in situ of unspecified breast: Secondary | ICD-10-CM | POA: Diagnosis not present

## 2021-03-01 DIAGNOSIS — Z0000 Encounter for general adult medical examination without abnormal findings: Secondary | ICD-10-CM | POA: Diagnosis not present

## 2021-03-01 DIAGNOSIS — R69 Illness, unspecified: Secondary | ICD-10-CM | POA: Diagnosis not present

## 2021-03-24 DIAGNOSIS — H5203 Hypermetropia, bilateral: Secondary | ICD-10-CM | POA: Diagnosis not present

## 2021-03-24 DIAGNOSIS — H2513 Age-related nuclear cataract, bilateral: Secondary | ICD-10-CM | POA: Diagnosis not present

## 2021-05-02 ENCOUNTER — Other Ambulatory Visit: Payer: Self-pay | Admitting: *Deleted

## 2021-05-02 DIAGNOSIS — Z170 Estrogen receptor positive status [ER+]: Secondary | ICD-10-CM

## 2021-05-02 DIAGNOSIS — C50411 Malignant neoplasm of upper-outer quadrant of right female breast: Secondary | ICD-10-CM

## 2021-05-02 NOTE — Progress Notes (Signed)
[place]  [phone] Fax:([phone]    ID: Tina Nielsen DOB: 1950-12-24  MR#: [id number]  HQP#:[id number]  Patient Care Team: Maurice Small, MD as PCP - General (Family Medicine) Keandre Linden, Virgie Dad, MD as Consulting Physician (Oncology) Rolm Bookbinder, MD as Consulting Physician (General Surgery) Thea Silversmith, MD as Consulting Physician (Radiation Oncology) Christene Slates, MD as Physician Assistant (Radiology) Fontaine, Belinda Block, MD (Inactive) as Consulting Physician (Gynecology) Huel Cote, NP (Inactive) as Nurse Practitioner (Obstetrics and Gynecology) OTHER MD:  CHIEF COMPLAINT: Estrogen receptor positive breast cancer  CURRENT TREATMENT: Completing 5 years of tamoxifen (10 mg)   INTERVAL HISTORY: "Tina Nielsen" returns today for follow-up and treatment of her CHEK2-associated estrogen receptor positive breast cancer.  She was switched to Tamoxifen at a decreased dose of 82m daily on 07/01/2019.  She has tolerated that much better and is finally ready to come off that medication.  Since her last visit, she underwent breast MRI on 07/26/2020 showing: breast composition D; no evidence of malignancy in either breast.  [facility]: breast density category C; no evidence of malignancy in either breast.    REVIEW OF SYSTEMS: TWannetta Senderis working every other day and enjoying it quite a bit.  She tells me she slipped and hit her head but did not break anything--she was evaluated by the school nurse.  She did not have headaches or visual changes following this.  A detailed review of systems today was otherwise stable   COVID 19 VACCINATION STATUS: Pfizer x4, also had COVID February 2022 and 2   BREAST CANCER HISTORY:  From the original intake note:  "Tina Nielsen" noted a possible lump in her left breast and [facility] The breast composition was category D. There was no finding in the left breast to correlate with the patient's lower inner quadrant concern. Ultrasound was obtained the same day and was likewise unremarkable.   However in the right breast upper outer quadrant posteriorly there was a 4 mm area of grouped calcifications. This was felt to warrant biopsy, which was performed stereotactically 09/01/2015. The final pathology ([facility] [id number] showed an invasive ductal carcinoma, grade 1, estrogen receptor 100% positive, progesterone receptor 90% positive, both with strong staining intensity, with an MIB-1 of 10%, and no HER-2 amplification, the signals ratio being 1.17 and the number per cell 2.35.  Bilateral breast MRI was obtained 09/06/2015. This describes the breast density as category C. In the upper outer quadrant of the right breast there was a clip. There was no suspicious surrounding enhancement. More laterally there was a 5.1 cm fluid collection compatible with a hematoma. The left breast was normal and there was no abnormal appearing adenopathy.   Her subsequent history is as detailed below.   PAST MEDICAL HISTORY: Past Medical History:  Diagnosis Date   Anxiety    Arthritis    knees   Atrophic vaginitis    Breast cancer of upper-outer quadrant of right female breast ([facility] 09/03/2015   Family history of breast cancer    High cholesterol    Osteopenia     PAST SURGICAL HISTORY: Past Surgical History:  Procedure Laterality Date   BREAST LUMPECTOMY WITH NEEDLE LOCALIZATION AND AXILLARY SENTINEL LYMPH NODE BX Right 09/30/2015   Procedure: RIGHT BREAST LUMPECTOMY WITH NEEDLE LOCALIZATION AND AXILLARY SENTINEL LYMPH NODE BX;  Surgeon: MRolm Bookbinder MD;  [place]  Service: General;  Laterality: Right;   BREAST SURGERY     BIOPSY   KNEE SURGERY     TUBAL LIGATION      FAMILY HISTORY Family History  Problem Relation Age of Onset    Hypertension Mother    Breast cancer Mother 65       second cancer at 77   Heart disease Mother    Aneurysm Father        AORTIC   Heart disease Maternal Grandfather    Kidney cancer Maternal Aunt 40   Prostate cancer Paternal Uncle 9   Stroke Maternal Grandmother    Breast cancer Other        MGMs sister   Breast cancer Other        PGMs sister   Breast cancer Other        PGF sister   Colon cancer Neg Hx   The patient's father died from a ruptured aortic aneurysm the age of 97. The patient's mother was diagnosed with breast cancer at age 37. She had a second breast cancer diagnosed at age 58.She died at age 18. The patient had one brother, no sisters. As far as she is aware that there are no other breast or ovarian cancer instances in the family   GYNECOLOGIC HISTORY:  No LMP recorded. Patient is postmenopausal. Menarche age 52, first live birth age 50, which the patient knows increases the risk of breast cancer. She is GX P2. She stopped having periods around age 16. She did not take hormone replacement. 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 Her husband Tina Nielsen taught history as well as coached basketball at the same school. He is now retired but is very active in the [employer]. He knows [name] well and has quite a few stories to tell regarding [facility] basketball. Their son Tina Nielsen lives in [place] and works in Designer, television/film set as a Environmental manager. Son Tina Nielsen lives in [place] working as a Designer, jewellery. The children have been made aware of the CHEK2 mutation and are considering testing.The patient has two grandchildren.    ADVANCED DIRECTIVES: In place   HEALTH MAINTENANCE: Social History   Tobacco Use   Smoking status: Former    Types: Cigarettes    Quit date: 08/07/1976    Years since quitting: 44.7   Smokeless tobacco:  Never  Vaping Use   Vaping Use: Never used  Substance Use Topics   Alcohol use: Yes    Alcohol/week: 7.0 standard drinks    Types: 7 Glasses of wine per week   Drug use: No     Colonoscopy: 2014/[facility]  PAP:  Bone density: June 2018, -2.0 osteopenia  Lipid panel:  Allergies  Allergen Reactions   Aleve [Naproxen] Other (See Comments)    "stomach irritation"   Celebrex [Celecoxib]     Irritated stomach- caused burning   Ibuprofen     "UPSET STOMACH"- burning    Current Outpatient Medications  Medication Sig Dispense Refill   acetaminophen (TYLENOL) 500 MG tablet Take 500 mg by mouth every 6 (six) hours as needed.     Apoaequorin (PREVAGEN PO) Take by mouth.     Calcium Carbonate-Vitamin D (CALCIUM + D PO) Take by mouth.     escitalopram (LEXAPRO) 10 MG tablet Take 1 tablet by mouth daily.     mometasone (NASONEX) 50 MCG/ACT nasal  spray Place 2 sprays into the nose daily.     Multiple Vitamin (MULTIVITAMIN) capsule Take 1 capsule by mouth daily.     pravastatin (PRAVACHOL) 80 MG tablet Take 1 tablet (80 mg total) by mouth daily.     VITAMIN E PO Take by mouth.     No current facility-administered medications for this visit.    OBJECTIVE: White woman in no acute distress Vitals:   05/03/21 1140  BP: 124/62  Pulse: 68  Temp: 97.9 F (36.6 C)  SpO2: 100%      Body mass index is 22.45 kg/m.    ECOG FS:1 - Symptomatic but completely ambulatory  Sclerae unicteric, EOMs intact Wearing a mask No cervical or supraclavicular adenopathy Lungs no rales or rhonchi Heart regular rate and rhythm Abd soft, nontender, positive bowel sounds MSK no focal spinal tenderness, no upper extremity lymphedema Neuro: nonfocal, well oriented, appropriate affect Breasts: The right breast is status postlumpectomy and radiation.  There is no evidence of local recurrence per the left breast is benign.  Both axillae are benign.   LAB RESULTS:  CMP     Component Value Date/Time   NA 142  05/03/2021 1122   NA 140 12/07/2016 1504   K 3.8 05/03/2021 1122   K 3.8 12/07/2016 1504   CL 104 05/03/2021 1122   CO2 28 05/03/2021 1122   CO2 28 12/07/2016 1504   GLUCOSE 82 05/03/2021 1122   GLUCOSE 99 12/07/2016 1504   BUN 16 05/03/2021 1122   BUN 16.3 12/07/2016 1504   CREATININE 1.00 05/03/2021 1122   CREATININE 0.8 12/07/2016 1504   CALCIUM 9.2 05/03/2021 1122   CALCIUM 8.9 12/07/2016 1504   PROT 6.6 05/03/2021 1122   PROT 6.5 12/07/2016 1504   ALBUMIN 3.8 05/03/2021 1122   ALBUMIN 3.6 12/07/2016 1504   AST 23 05/03/2021 1122   AST 21 12/07/2016 1504   ALT 17 05/03/2021 1122   ALT 18 12/07/2016 1504   ALKPHOS 55 05/03/2021 1122   ALKPHOS 50 12/07/2016 1504   BILITOT 0.7 05/03/2021 1122   BILITOT 0.61 12/07/2016 1504   GFRNONAA >60 05/03/2021 1122   GFRAA >60 04/30/2020 1412    INo results found for: SPEP, UPEP  Lab Results  Component Value Date   WBC 5.5 05/03/2021   NEUTROABS 3.4 05/03/2021   HGB 12.5 05/03/2021   HCT 37.3 05/03/2021   MCV 96.1 05/03/2021   PLT 185 05/03/2021      Chemistry      Component Value Date/Time   NA 142 05/03/2021 1122   NA 140 12/07/2016 1504   K 3.8 05/03/2021 1122   K 3.8 12/07/2016 1504   CL 104 05/03/2021 1122   CO2 28 05/03/2021 1122   CO2 28 12/07/2016 1504   BUN 16 05/03/2021 1122   BUN 16.3 12/07/2016 1504   CREATININE 1.00 05/03/2021 1122   CREATININE 0.8 12/07/2016 1504      Component Value Date/Time   CALCIUM 9.2 05/03/2021 1122   CALCIUM 8.9 12/07/2016 1504   ALKPHOS 55 05/03/2021 1122   ALKPHOS 50 12/07/2016 1504   AST 23 05/03/2021 1122   AST 21 12/07/2016 1504   ALT 17 05/03/2021 1122   ALT 18 12/07/2016 1504   BILITOT 0.7 05/03/2021 1122   BILITOT 0.61 12/07/2016 1504       No results found for: LABCA2  No components found for: LABCA125  No results for input(s): INR in the last 168 hours.  Urinalysis    Component  Value Date/Time   COLORURINE YELLOW 10/28/2019 [place] 10/28/2019 1604   LABSPEC 1.021 10/28/2019 1604   PHURINE 6.5 10/28/2019 [place] 10/28/2019 1604   HGBUR NEGATIVE 10/28/2019 [place] 01/23/2017 1628   KETONESUR TRACE (A) 10/28/2019 1604   PROTEINUR NEGATIVE 10/28/2019 1604   UROBILINOGEN 0.2 02/10/2014 0846   NITRITE NEGATIVE 01/23/2017 1628   LEUKOCYTESUR NEGATIVE 01/23/2017 1628    ELIGIBLE FOR AVAILABLE RESEARCH PROTOCOL: no   STUDIES: No results found.   ASSESSMENT: 70 y.o. [place] woman status post right breast upper outer quadrant biopsy 09/01/2015 for a clinical T1a N0, stage IA invasive ductal carcinoma, grade 1, estrogen and progesterone receptor positive, HER-2 not amplified, with an MIB-1 of 10%  (1) genetics testing 10/19/2015 through the Breast/Ovarian gene panel offered by [facility] found no deleterious mutations in  ATM, BARD1, BRCA1, BRCA2, BRIP1, CDH1, EPCAM, FANCC, MLH1, MSH2, MSH6, NBN, PALB2, PMS2, PTEN, RAD51C, RAD51D, TP53, and XRCC2.  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   (b) Genetic testing also detected a Variant of Unknown Significance in the BRCA2 gene called [id number].  (c) breast cancer intensified screening will consist of breast MRI in July and breast mammography in January yearly  (d) colon cancer screening will consist of colonoscopy every 5 years or more often depending on findings   (i) most recent colonoscopy 07/28/2013; no polyps noted  (2) status post right lumpectomy with sentinel lymph node sampling 09/30/2015 for a pTis pN0, stage 0 ductal carcinoma in situ, grade 2, with negative margins  (3) adjuvant radiation (mammosite) completed May 2017  (4) started tamoxifen 01/06/2016, switched to anastrozole starting 12/06/2018  (a) endometrial biopsy 10/11/2016 shows scant tissue with an atrophic pattern.  (b) switched from anastrozole to exemestane August 2020 due to side effects  (c)  discontinued exemestane October 2020 due to side effects  (d) resumed tamoxifen now at 10 mg daily beginning 07/01/2019  (5) bone density at [facility] 01/22/2017 shows a T score of -2.0  (a) repeat bone density 05/16/2019 at [place] shows a T score of -2.0 (stable)    PLAN: Tina Nielsen is now 5 and half years out from definitive surgery for her breast cancer with no evidence of disease recurrence.  This is very favorable.  She has completed 5 years of tamoxifen and we are stopping that medication at this point.  We will continue to follow her for intensified screening until the breast density decreases to A or possibly 2B if that is acceptable to her.  I have entered the orders for her next mammogram and breast MRI and she will return to see [place] in 1 year.  Total encounter time: 20 minutes*   Anees Vanecek C. Windi Toro, MD 05/03/21 5:59 PM Medical Oncology and Hematology [facility] [place], [place] [address] Tel. [phone]    Fax. [phone]   I, Wilburn Mylar, am acting as scribe for Dr. Virgie Dad. Latron Ribas.  I, Lurline Del MD, have reviewed the above documentation for accuracy and completeness, and I agree with the above.    *Total Encounter Time as defined by the Centers for Medicare and Medicaid Services includes, in addition to the face-to-face time of a patient visit (documented in the note above) non-face-to-face time: obtaining and reviewing outside history, ordering and reviewing medications, tests or procedures, care coordination (communications with other health care professionals or caregivers) and documentation in the medical record.

## 2021-05-03 ENCOUNTER — Inpatient Hospital Stay: Payer: Medicare HMO | Attending: Oncology | Admitting: Oncology

## 2021-05-03 ENCOUNTER — Other Ambulatory Visit: Payer: Self-pay

## 2021-05-03 ENCOUNTER — Inpatient Hospital Stay: Payer: Medicare HMO

## 2021-05-03 ENCOUNTER — Other Ambulatory Visit: Payer: Self-pay | Admitting: *Deleted

## 2021-05-03 VITALS — BP 124/62 | HR 68 | Temp 97.9°F | Wt 134.9 lb

## 2021-05-03 DIAGNOSIS — Z923 Personal history of irradiation: Secondary | ICD-10-CM | POA: Insufficient documentation

## 2021-05-03 DIAGNOSIS — Z170 Estrogen receptor positive status [ER+]: Secondary | ICD-10-CM | POA: Insufficient documentation

## 2021-05-03 DIAGNOSIS — Z79899 Other long term (current) drug therapy: Secondary | ICD-10-CM | POA: Diagnosis not present

## 2021-05-03 DIAGNOSIS — Z79811 Long term (current) use of aromatase inhibitors: Secondary | ICD-10-CM | POA: Insufficient documentation

## 2021-05-03 DIAGNOSIS — Z1502 Genetic susceptibility to malignant neoplasm of ovary: Secondary | ICD-10-CM

## 2021-05-03 DIAGNOSIS — Z1509 Genetic susceptibility to other malignant neoplasm: Secondary | ICD-10-CM

## 2021-05-03 DIAGNOSIS — E7800 Pure hypercholesterolemia, unspecified: Secondary | ICD-10-CM | POA: Diagnosis not present

## 2021-05-03 DIAGNOSIS — Z7951 Long term (current) use of inhaled steroids: Secondary | ICD-10-CM | POA: Insufficient documentation

## 2021-05-03 DIAGNOSIS — C50919 Malignant neoplasm of unspecified site of unspecified female breast: Secondary | ICD-10-CM | POA: Diagnosis not present

## 2021-05-03 DIAGNOSIS — Z1589 Genetic susceptibility to other disease: Secondary | ICD-10-CM | POA: Diagnosis not present

## 2021-05-03 DIAGNOSIS — Z87891 Personal history of nicotine dependence: Secondary | ICD-10-CM | POA: Insufficient documentation

## 2021-05-03 DIAGNOSIS — C50411 Malignant neoplasm of upper-outer quadrant of right female breast: Secondary | ICD-10-CM | POA: Diagnosis not present

## 2021-05-03 DIAGNOSIS — M8580 Other specified disorders of bone density and structure, unspecified site: Secondary | ICD-10-CM | POA: Insufficient documentation

## 2021-05-03 LAB — CBC WITH DIFFERENTIAL (CANCER CENTER ONLY)
Abs Immature Granulocytes: 0.01 10*3/uL (ref 0.00–0.07)
Basophils Absolute: 0 10*3/uL (ref 0.0–0.1)
Basophils Relative: 1 %
Eosinophils Absolute: 0.1 10*3/uL (ref 0.0–0.5)
Eosinophils Relative: 2 %
HCT: 37.3 % (ref 36.0–46.0)
Hemoglobin: 12.5 g/dL (ref 12.0–15.0)
Immature Granulocytes: 0 %
Lymphocytes Relative: 30 %
Lymphs Abs: 1.7 10*3/uL (ref 0.7–4.0)
MCH: 32.2 pg (ref 26.0–34.0)
MCHC: 33.5 g/dL (ref 30.0–36.0)
MCV: 96.1 fL (ref 80.0–100.0)
Monocytes Absolute: 0.4 10*3/uL (ref 0.1–1.0)
Monocytes Relative: 7 %
Neutro Abs: 3.4 10*3/uL (ref 1.7–7.7)
Neutrophils Relative %: 60 %
Platelet Count: 185 10*3/uL (ref 150–400)
RBC: 3.88 MIL/uL (ref 3.87–5.11)
RDW: 12.7 % (ref 11.5–15.5)
WBC Count: 5.5 10*3/uL (ref 4.0–10.5)
nRBC: 0 % (ref 0.0–0.2)

## 2021-05-03 LAB — CMP (CANCER CENTER ONLY)
ALT: 17 U/L (ref 0–44)
AST: 23 U/L (ref 15–41)
Albumin: 3.8 g/dL (ref 3.5–5.0)
Alkaline Phosphatase: 55 U/L (ref 38–126)
Anion gap: 10 (ref 5–15)
BUN: 16 mg/dL (ref 8–23)
CO2: 28 mmol/L (ref 22–32)
Calcium: 9.2 mg/dL (ref 8.9–10.3)
Chloride: 104 mmol/L (ref 98–111)
Creatinine: 1 mg/dL (ref 0.44–1.00)
GFR, Estimated: >60 mL/min (ref >60)
Glucose, Bld: 82 mg/dL (ref 70–99)
Potassium: 3.8 mmol/L (ref 3.5–5.1)
Sodium: 142 mmol/L (ref 135–145)
Total Bilirubin: 0.7 mg/dL (ref 0.3–1.2)
Total Protein: 6.6 g/dL (ref 6.5–8.1)

## 2021-05-04 DIAGNOSIS — M1712 Unilateral primary osteoarthritis, left knee: Secondary | ICD-10-CM | POA: Diagnosis not present

## 2021-05-04 DIAGNOSIS — M1711 Unilateral primary osteoarthritis, right knee: Secondary | ICD-10-CM | POA: Diagnosis not present

## 2021-05-04 DIAGNOSIS — M170 Bilateral primary osteoarthritis of knee: Secondary | ICD-10-CM | POA: Diagnosis not present

## 2021-05-04 DIAGNOSIS — M25562 Pain in left knee: Secondary | ICD-10-CM | POA: Diagnosis not present

## 2021-05-31 ENCOUNTER — Encounter: Payer: Self-pay | Admitting: Oncology

## 2021-05-31 DIAGNOSIS — Z780 Asymptomatic menopausal state: Secondary | ICD-10-CM | POA: Diagnosis not present

## 2021-05-31 DIAGNOSIS — M8589 Other specified disorders of bone density and structure, multiple sites: Secondary | ICD-10-CM | POA: Diagnosis not present

## 2021-06-02 DIAGNOSIS — H5203 Hypermetropia, bilateral: Secondary | ICD-10-CM | POA: Diagnosis not present

## 2021-06-02 DIAGNOSIS — H25811 Combined forms of age-related cataract, right eye: Secondary | ICD-10-CM | POA: Diagnosis not present

## 2021-06-02 DIAGNOSIS — H524 Presbyopia: Secondary | ICD-10-CM | POA: Diagnosis not present

## 2021-06-02 DIAGNOSIS — H52223 Regular astigmatism, bilateral: Secondary | ICD-10-CM | POA: Diagnosis not present

## 2021-06-02 DIAGNOSIS — H25813 Combined forms of age-related cataract, bilateral: Secondary | ICD-10-CM | POA: Diagnosis not present

## 2021-06-02 DIAGNOSIS — H2589 Other age-related cataract: Secondary | ICD-10-CM | POA: Diagnosis not present

## 2021-06-02 DIAGNOSIS — H25812 Combined forms of age-related cataract, left eye: Secondary | ICD-10-CM | POA: Diagnosis not present

## 2021-06-29 DIAGNOSIS — B9689 Other specified bacterial agents as the cause of diseases classified elsewhere: Secondary | ICD-10-CM | POA: Diagnosis not present

## 2021-06-29 DIAGNOSIS — J019 Acute sinusitis, unspecified: Secondary | ICD-10-CM | POA: Diagnosis not present

## 2021-08-02 ENCOUNTER — Other Ambulatory Visit: Payer: Self-pay

## 2021-08-02 ENCOUNTER — Ambulatory Visit
Admission: RE | Admit: 2021-08-02 | Discharge: 2021-08-02 | Disposition: A | Discharge status: Home / Self Care | Payer: Medicare HMO | Source: Ambulatory Visit | Attending: Oncology | Admitting: Oncology

## 2021-08-02 DIAGNOSIS — Z1509 Genetic susceptibility to other malignant neoplasm: Secondary | ICD-10-CM

## 2021-08-02 DIAGNOSIS — C50411 Malignant neoplasm of upper-outer quadrant of right female breast: Secondary | ICD-10-CM | POA: Diagnosis not present

## 2021-08-02 DIAGNOSIS — Z803 Family history of malignant neoplasm of breast: Secondary | ICD-10-CM | POA: Diagnosis not present

## 2021-08-02 DIAGNOSIS — Z853 Personal history of malignant neoplasm of breast: Secondary | ICD-10-CM | POA: Diagnosis not present

## 2021-08-02 MED ORDER — GADOBUTROL 1 MMOL/ML IV SOLN
6.0000 mL | Freq: Once | INTRAVENOUS | Status: AC | PRN
  Administered 2021-08-02: 11:00:00 6 mL via INTRAVENOUS

## 2021-08-03 ENCOUNTER — Telehealth: Payer: Self-pay

## 2021-08-03 NOTE — Telephone Encounter (Signed)
-----   Message from Gardenia Phlegm, NP sent at 08/03/2021  1:36 PM EST ----- Screening MRI is negative for cancer bilaterally there are no concerns present.  Please notify patient. ----- Message ----- From: Buel Ream, Rad Results In Sent: 08/02/2021   4:20 PM EST To: Chauncey Cruel, MD

## 2021-08-03 NOTE — Telephone Encounter (Signed)
Attempted to call pt to give results. VM box is full. Attempted pt's home number, no voicemail set up.

## 2021-08-04 DIAGNOSIS — D2262 Melanocytic nevi of left upper limb, including shoulder: Secondary | ICD-10-CM | POA: Diagnosis not present

## 2021-08-04 DIAGNOSIS — L820 Inflamed seborrheic keratosis: Secondary | ICD-10-CM | POA: Diagnosis not present

## 2021-08-04 DIAGNOSIS — D2239 Melanocytic nevi of other parts of face: Secondary | ICD-10-CM | POA: Diagnosis not present

## 2021-08-04 DIAGNOSIS — D1801 Hemangioma of skin and subcutaneous tissue: Secondary | ICD-10-CM | POA: Diagnosis not present

## 2021-08-04 DIAGNOSIS — L814 Other melanin hyperpigmentation: Secondary | ICD-10-CM | POA: Diagnosis not present

## 2021-08-04 DIAGNOSIS — D225 Melanocytic nevi of trunk: Secondary | ICD-10-CM | POA: Diagnosis not present

## 2021-08-04 DIAGNOSIS — J019 Acute sinusitis, unspecified: Secondary | ICD-10-CM | POA: Diagnosis not present

## 2021-09-14 ENCOUNTER — Telehealth: Payer: Self-pay

## 2021-09-14 NOTE — Telephone Encounter (Signed)
Called pt, left VM, per LC bone density stable

## 2021-09-14 NOTE — Telephone Encounter (Signed)
-----   Message from Gardenia Phlegm, NP sent at 09/13/2021  2:10 PM EST ----- Bone density is stable to improved please notify patient ----- Message ----- From: Aleatha Borer Sent: 09/13/2021  11:35 AM EST To: Gardenia Phlegm, NP

## 2021-10-05 DIAGNOSIS — M170 Bilateral primary osteoarthritis of knee: Secondary | ICD-10-CM | POA: Diagnosis not present

## 2021-10-14 DIAGNOSIS — H2513 Age-related nuclear cataract, bilateral: Secondary | ICD-10-CM | POA: Diagnosis not present

## 2021-10-14 DIAGNOSIS — H25043 Posterior subcapsular polar age-related cataract, bilateral: Secondary | ICD-10-CM | POA: Diagnosis not present

## 2021-10-14 DIAGNOSIS — H25013 Cortical age-related cataract, bilateral: Secondary | ICD-10-CM | POA: Diagnosis not present

## 2021-10-14 DIAGNOSIS — H18413 Arcus senilis, bilateral: Secondary | ICD-10-CM | POA: Diagnosis not present

## 2021-10-14 DIAGNOSIS — H2511 Age-related nuclear cataract, right eye: Secondary | ICD-10-CM | POA: Diagnosis not present

## 2021-11-10 DIAGNOSIS — H2511 Age-related nuclear cataract, right eye: Secondary | ICD-10-CM | POA: Diagnosis not present

## 2021-11-11 DIAGNOSIS — H2511 Age-related nuclear cataract, right eye: Secondary | ICD-10-CM | POA: Diagnosis not present

## 2021-11-23 ENCOUNTER — Encounter: Payer: Self-pay | Admitting: Nurse Practitioner

## 2021-11-23 ENCOUNTER — Other Ambulatory Visit (HOSPITAL_COMMUNITY)
Admission: RE | Admit: 2021-11-23 | Discharge: 2021-11-23 | Disposition: A | Discharge status: Home / Self Care | Payer: Medicare HMO | Source: Ambulatory Visit | Attending: Nurse Practitioner | Admitting: Nurse Practitioner

## 2021-11-23 ENCOUNTER — Ambulatory Visit (INDEPENDENT_AMBULATORY_CARE_PROVIDER_SITE_OTHER): Payer: Medicare HMO | Admitting: Nurse Practitioner

## 2021-11-23 VITALS — BP 118/76 | Ht 65.0 in | Wt 134.0 lb

## 2021-11-23 DIAGNOSIS — Z1151 Encounter for screening for human papillomavirus (HPV): Secondary | ICD-10-CM | POA: Diagnosis not present

## 2021-11-23 DIAGNOSIS — Z9189 Other specified personal risk factors, not elsewhere classified: Secondary | ICD-10-CM

## 2021-11-23 DIAGNOSIS — M8589 Other specified disorders of bone density and structure, multiple sites: Secondary | ICD-10-CM | POA: Diagnosis not present

## 2021-11-23 DIAGNOSIS — Z01419 Encounter for gynecological examination (general) (routine) without abnormal findings: Secondary | ICD-10-CM | POA: Insufficient documentation

## 2021-11-23 DIAGNOSIS — Z124 Encounter for screening for malignant neoplasm of cervix: Secondary | ICD-10-CM

## 2021-11-23 DIAGNOSIS — Z780 Asymptomatic menopausal state: Secondary | ICD-10-CM | POA: Diagnosis not present

## 2021-11-23 DIAGNOSIS — Z853 Personal history of malignant neoplasm of breast: Secondary | ICD-10-CM | POA: Diagnosis not present

## 2021-11-23 DIAGNOSIS — Z9289 Personal history of other medical treatment: Secondary | ICD-10-CM

## 2021-11-23 DIAGNOSIS — R69 Illness, unspecified: Secondary | ICD-10-CM | POA: Diagnosis not present

## 2021-11-23 NOTE — Progress Notes (Signed)
? ?  Tina Nielsen October 22, 1950 [id number] ? ? ?History:  71 y.o. G2P0002 presents for breast and pelvic exam. No GYN complaints. Normal pap history. Postmenopausal - no HRT, no bleeding. 2017 right breast cancer ER/PR + HER-2 negative CHEK2 + managed with lumpectomy, radiation, chemo, and tamoxifen.  ? ?Gynecologic History ?No LMP recorded. Patient is postmenopausal. ?  ?Contraception/Family planning: post menopausal status ?Sexually active: No ? ?Health Maintenance ?Last Pap: 01/23/2017. Results were: Normal ?Last breast MRI: 08/02/2021. Results were: Normal ?Last colonoscopy: 2021. Results were: Benign polyp, 5-year recall ?Last Dexa: 05/31/2021. Results were: T-score -1.9, FRAX 11% / 2.2% ? ?Past medical history, past surgical history, family history and social history were all reviewed and documented in the EPIC chart. 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 2 sons. 2 grandchildren ages 39 and 62 months. Mother had breast cancer in her 51s, 90s, and 14s.  ? ?ROS:  A ROS was performed and pertinent positives and negatives are included. ? ?Exam: ? ?Vitals:  ? 11/23/21 1518  ?BP: 118/76  ?Weight: 134 lb (60.8 kg)  ?Height: $RemoveB'5\' 5"'ziDnQHgL$  (1.651 m)  ? ? ?Body mass index is 22.3 kg/m?. ? ?General appearance:  Normal ?Thyroid:  Symmetrical, normal in size, without palpable masses or nodularity. ?Respiratory ? Auscultation:  Clear without wheezing or rhonchi ?Cardiovascular ? Auscultation:  Regular rate, without rubs, murmurs or gallops ? Edema/varicosities:  Not grossly evident ?Abdominal ? Soft,nontender, without masses, guarding or rebound. ? Liver/spleen:  No organomegaly noted ? Hernia:  None appreciated ? Skin ? Inspection:  Grossly normal ?  ?Breasts: Examined lying and sitting.  ? Right: Without masses, retractions, discharge or axillary adenopathy. ? ? Left: Without masses, retractions, discharge or axillary adenopathy. ?Genitourinary  ? Inguinal/mons:  Normal without inguinal adenopathy ? External  genitalia:  Normal appearing vulva with no masses, tenderness, or lesions ? BUS/Urethra/Skene's glands:  Normal ? Vagina:  Normal appearing with normal color and discharge, no lesions. Atrophic changes ? Cervix:  Normal appearing without discharge or lesions ? Uterus:  Normal in size, shape and contour.  Midline and mobile, nontender ? Adnexa/parametria:   ?  Rt: Normal in size, without masses or tenderness. ?  Lt: Normal in size, without masses or tenderness. ? Anus and perineum: Normal ? Digital rectal exam: Normal sphincter tone without palpated masses or tenderness ? ?Patient informed chaperone available to be present for breast and pelvic exam. Patient has requested no chaperone to be present. Patient has been advised what will be completed during breast and pelvic exam.  ? ?Assessment/Plan:  71 y.o. G2P0002 for breast and pelvic exam.  ? ?Well female exam with routine gynecological exam - Education provided on SBEs, importance of preventative screenings, current guidelines, high calcium diet, regular exercise, and multivitamin daily. Labs with PCP.  ? ?History of right breast cancer - 2017 right breast cancer ER/PR + HER-2 negative CHEK-2 + managed with lumpectomy, radiation, chemo, and tamoxifen. Mammogram and breast MRI annually.  ? ?Osteopenia of multiple sites - 2022 T-score -1.9. Continue daily vitamin D supplement and regular exercise.  ? ?Screening for cervical cancer - Normal Pap history.  She would like pap today and if normal she is agreeable to stop screenings.  ? ?Screening for colon cancer - 2021 colonoscopy.  Will repeat at 5-year interval per GI's recommendation.  ? ?Return in 1 year for breast and pelvic exam.  ? ? ?Tamela Gammon DNP, 3:40 PM 11/23/2021 ? ?

## 2021-11-24 LAB — CYTOLOGY - PAP
Comment: NEGATIVE
Diagnosis: NEGATIVE
High risk HPV: NEGATIVE

## 2021-12-08 DIAGNOSIS — Z961 Presence of intraocular lens: Secondary | ICD-10-CM | POA: Diagnosis not present

## 2021-12-08 DIAGNOSIS — H524 Presbyopia: Secondary | ICD-10-CM | POA: Diagnosis not present

## 2021-12-12 DIAGNOSIS — Z961 Presence of intraocular lens: Secondary | ICD-10-CM | POA: Diagnosis not present

## 2021-12-16 ENCOUNTER — Other Ambulatory Visit: Payer: Self-pay | Admitting: *Deleted

## 2022-02-08 ENCOUNTER — Other Ambulatory Visit: Payer: Self-pay | Admitting: *Deleted

## 2022-02-23 DIAGNOSIS — M1711 Unilateral primary osteoarthritis, right knee: Secondary | ICD-10-CM | POA: Diagnosis not present

## 2022-02-23 DIAGNOSIS — Z1231 Encounter for screening mammogram for malignant neoplasm of breast: Secondary | ICD-10-CM | POA: Diagnosis not present

## 2022-02-23 DIAGNOSIS — M1712 Unilateral primary osteoarthritis, left knee: Secondary | ICD-10-CM | POA: Diagnosis not present

## 2022-02-23 DIAGNOSIS — M25562 Pain in left knee: Secondary | ICD-10-CM | POA: Diagnosis not present

## 2022-02-28 DIAGNOSIS — H903 Sensorineural hearing loss, bilateral: Secondary | ICD-10-CM | POA: Diagnosis not present

## 2022-04-07 DIAGNOSIS — Z5181 Encounter for therapeutic drug level monitoring: Secondary | ICD-10-CM | POA: Diagnosis not present

## 2022-04-07 DIAGNOSIS — E785 Hyperlipidemia, unspecified: Secondary | ICD-10-CM | POA: Diagnosis not present

## 2022-04-13 DIAGNOSIS — Z5181 Encounter for therapeutic drug level monitoring: Secondary | ICD-10-CM | POA: Diagnosis not present

## 2022-04-13 DIAGNOSIS — Z1509 Genetic susceptibility to other malignant neoplasm: Secondary | ICD-10-CM | POA: Diagnosis not present

## 2022-04-13 DIAGNOSIS — Z1159 Encounter for screening for other viral diseases: Secondary | ICD-10-CM | POA: Diagnosis not present

## 2022-04-13 DIAGNOSIS — Z87891 Personal history of nicotine dependence: Secondary | ICD-10-CM | POA: Diagnosis not present

## 2022-04-13 DIAGNOSIS — Z8249 Family history of ischemic heart disease and other diseases of the circulatory system: Secondary | ICD-10-CM | POA: Diagnosis not present

## 2022-04-13 DIAGNOSIS — R69 Illness, unspecified: Secondary | ICD-10-CM | POA: Diagnosis not present

## 2022-04-13 DIAGNOSIS — E785 Hyperlipidemia, unspecified: Secondary | ICD-10-CM | POA: Diagnosis not present

## 2022-04-13 DIAGNOSIS — Z0000 Encounter for general adult medical examination without abnormal findings: Secondary | ICD-10-CM | POA: Diagnosis not present

## 2022-04-17 ENCOUNTER — Other Ambulatory Visit: Payer: Self-pay | Admitting: Family Medicine

## 2022-04-17 DIAGNOSIS — Z8249 Family history of ischemic heart disease and other diseases of the circulatory system: Secondary | ICD-10-CM

## 2022-04-17 DIAGNOSIS — Z87891 Personal history of nicotine dependence: Secondary | ICD-10-CM

## 2022-04-27 ENCOUNTER — Ambulatory Visit
Admission: RE | Admit: 2022-04-27 | Discharge: 2022-04-27 | Disposition: A | Discharge status: Home / Self Care | Payer: Medicare HMO | Source: Ambulatory Visit | Attending: Family Medicine | Admitting: Family Medicine

## 2022-04-27 DIAGNOSIS — Z8249 Family history of ischemic heart disease and other diseases of the circulatory system: Secondary | ICD-10-CM

## 2022-04-27 DIAGNOSIS — Z8679 Personal history of other diseases of the circulatory system: Secondary | ICD-10-CM | POA: Diagnosis not present

## 2022-04-27 DIAGNOSIS — Z87891 Personal history of nicotine dependence: Secondary | ICD-10-CM

## 2022-05-02 DIAGNOSIS — H18413 Arcus senilis, bilateral: Secondary | ICD-10-CM | POA: Diagnosis not present

## 2022-05-02 DIAGNOSIS — H2512 Age-related nuclear cataract, left eye: Secondary | ICD-10-CM | POA: Diagnosis not present

## 2022-05-02 DIAGNOSIS — H25042 Posterior subcapsular polar age-related cataract, left eye: Secondary | ICD-10-CM | POA: Diagnosis not present

## 2022-05-02 DIAGNOSIS — H25012 Cortical age-related cataract, left eye: Secondary | ICD-10-CM | POA: Diagnosis not present

## 2022-05-02 DIAGNOSIS — Z961 Presence of intraocular lens: Secondary | ICD-10-CM | POA: Diagnosis not present

## 2022-05-03 ENCOUNTER — Ambulatory Visit: Payer: Medicare HMO | Admitting: Hematology and Oncology

## 2022-05-04 ENCOUNTER — Encounter: Payer: Self-pay | Admitting: Hematology and Oncology

## 2022-05-04 ENCOUNTER — Inpatient Hospital Stay: Payer: Medicare HMO | Attending: Hematology and Oncology | Admitting: Hematology and Oncology

## 2022-05-04 VITALS — BP 126/96 | HR 68 | Temp 97.5°F | Resp 14 | Ht 65.0 in | Wt 136.7 lb

## 2022-05-04 DIAGNOSIS — Z79810 Long term (current) use of selective estrogen receptor modulators (SERMs): Secondary | ICD-10-CM | POA: Diagnosis not present

## 2022-05-04 DIAGNOSIS — Z1502 Genetic susceptibility to malignant neoplasm of ovary: Secondary | ICD-10-CM

## 2022-05-04 DIAGNOSIS — C50919 Malignant neoplasm of unspecified site of unspecified female breast: Secondary | ICD-10-CM | POA: Diagnosis not present

## 2022-05-04 DIAGNOSIS — Z923 Personal history of irradiation: Secondary | ICD-10-CM | POA: Diagnosis not present

## 2022-05-04 DIAGNOSIS — Z170 Estrogen receptor positive status [ER+]: Secondary | ICD-10-CM | POA: Insufficient documentation

## 2022-05-04 DIAGNOSIS — Z1509 Genetic susceptibility to other malignant neoplasm: Secondary | ICD-10-CM | POA: Diagnosis not present

## 2022-05-04 DIAGNOSIS — C50411 Malignant neoplasm of upper-outer quadrant of right female breast: Secondary | ICD-10-CM | POA: Diagnosis not present

## 2022-05-04 DIAGNOSIS — M8580 Other specified disorders of bone density and structure, unspecified site: Secondary | ICD-10-CM | POA: Insufficient documentation

## 2022-05-04 DIAGNOSIS — Z1589 Genetic susceptibility to other disease: Secondary | ICD-10-CM

## 2022-05-04 NOTE — Progress Notes (Signed)
[place]  Telephone:(336) [phone] Fax:(336) [phone]    ID: HIRA TRENT DOB: 18-Jun-1951  MR#: [id number]  YDX#:[id number]  Patient Care Team: Maurice Small, MD as PCP - General (Family Medicine) Magrinat, Virgie Dad, MD (Inactive) as Consulting Physician (Oncology) Rolm Bookbinder, MD as Consulting Physician (General Surgery) Thea Silversmith, MD as Consulting Physician (Radiation Oncology) Christene Slates, MD as Physician Assistant (Radiology) Fontaine, Belinda Block, MD (Inactive) as Consulting Physician (Gynecology) Huel Cote, NP (Inactive) as Nurse Practitioner (Obstetrics and Gynecology) OTHER MD:  CHIEF COMPLAINT: Estrogen receptor positive breast cancer  CURRENT TREATMENT: Completed antiestrogen therapy   INTERVAL HISTORY: Ms. Stallworth is here for follow-up.  Since her last visit, she continues to do well.  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  She denies any changes in her breast.  She last had a mammogram which was unremarkable, noted breast density C hence she is alternating MRIs with mammograms.  Rest of the pertinent 10 point ROS reviewed and negative   COVID 19 VACCINATION STATUS: Pfizer x4, also had COVID February 2022 and 2   BREAST CANCER HISTORY:  From the original intake note:  "[name]" noted a possible lump in her left breast and was referred for diagnostic mammography with tomosynthesis at [facility] 08/17/2015. The breast composition was category D. There was no finding in the left breast to correlate with the patient's lower inner quadrant concern. Ultrasound was obtained the same day and was likewise unremarkable.   However in the right breast upper outer quadrant posteriorly there was a 4 mm area of grouped calcifications. This was felt to warrant biopsy, which was performed stereotactically 09/01/2015. The final pathology ([facility] [id number]) showed an invasive ductal carcinoma, grade 1, estrogen receptor 100% positive,  progesterone receptor 90% positive, both with strong staining intensity, with an MIB-1 of 10%, and no HER-2 amplification, the signals ratio being 1.17 and the number per cell 2.35.  Bilateral breast MRI was obtained 09/06/2015. This describes the breast density as category C. In the upper outer quadrant of the right breast there was a clip. There was no suspicious surrounding enhancement. More laterally there was a 5.1 cm fluid collection compatible with a hematoma. The left breast was normal and there was no abnormal appearing adenopathy.   Her subsequent history is as detailed below.   PAST MEDICAL HISTORY: Past Medical History:  Diagnosis Date   Anxiety    Arthritis    knees   Atrophic vaginitis    Breast cancer of upper-outer quadrant of right female breast ([place]) 09/03/2015   Family history of breast cancer    High cholesterol    Osteopenia     PAST SURGICAL HISTORY: Past Surgical History:  Procedure Laterality Date   BREAST LUMPECTOMY WITH NEEDLE LOCALIZATION AND AXILLARY SENTINEL LYMPH NODE BX Right 09/30/2015   Procedure: RIGHT BREAST LUMPECTOMY WITH NEEDLE LOCALIZATION AND AXILLARY SENTINEL LYMPH NODE BX;  Surgeon: Rolm Bookbinder, MD;  Location: [facility];  Service: General;  Laterality: Right;   BREAST SURGERY     BIOPSY   cataract surg Right    KNEE SURGERY     TUBAL LIGATION      FAMILY HISTORY Family History  Problem Relation Age of Onset   Hypertension Mother    Breast cancer Mother 86       second cancer at 60   Heart disease Mother    Aneurysm Father        AORTIC   Kidney cancer  Maternal Aunt 51   Breast cancer Paternal Aunt    Prostate cancer Paternal Uncle 76   Stroke Maternal Grandmother    Heart disease Maternal Grandfather    Breast cancer Other        MGMs sister   Breast cancer Other        PGMs sister   Breast cancer Other        PGF sister   Colon cancer Neg Hx   The patient's father died from a ruptured aortic aneurysm  the age of 93. The patient's mother was diagnosed with breast cancer at age 36. She had a second breast cancer diagnosed at age 32.She died at age 66. The patient had one brother, no sisters. As far as she is aware that there are no other breast or ovarian cancer instances in the family   GYNECOLOGIC HISTORY:  No LMP recorded. Patient is postmenopausal. Menarche age 6, first live birth age 12, which the patient knows increases the risk of breast cancer. She is GX P2. She stopped having periods around age 70. She did not take hormone replacement. 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 Her husband [name] taught history as well as coached basketball at the same school. He is now retired but is very active in the [employer]. He knows [name] well and has quite a few stories to tell regarding [facility] basketball. Their son [name] lives in [place] and works in Designer, television/film set as a Environmental manager. Son [name] lives in [place] working as a Designer, jewellery. The children have been made aware of the CHEK2 mutation and are considering testing.The patient has two grandchildren.    ADVANCED DIRECTIVES: In place   HEALTH MAINTENANCE: Social History   Tobacco Use   Smoking status: Former    Types: Cigarettes    Quit date: 08/07/1976    Years since quitting: 45.7   Smokeless tobacco: Never  Vaping Use   Vaping Use: Never used  Substance Use Topics   Alcohol use: Yes    Alcohol/week: 7.0 standard drinks of alcohol    Types: 7 Glasses of wine per week   Drug use: No     Colonoscopy: 2014/[facility]  PAP:  Bone density: June 2018, -2.0 osteopenia  Lipid panel:  Allergies  Allergen Reactions   Aleve [Naproxen] Other (See Comments)    "stomach irritation"   Celebrex [Celecoxib]     Irritated stomach- caused burning   Ibuprofen     "UPSET STOMACH"-  burning    Current Outpatient Medications  Medication Sig Dispense Refill   acetaminophen (TYLENOL) 500 MG tablet Take 500 mg by mouth every 6 (six) hours as needed.     Apoaequorin (PREVAGEN PO) Take by mouth.     Calcium Carbonate-Vitamin D (CALCIUM + D PO) Take by mouth.     escitalopram (LEXAPRO) 10 MG tablet Take 1 tablet by mouth daily.     mometasone (NASONEX) 50 MCG/ACT nasal spray Place 2 sprays into the nose daily.     Multiple Vitamin (MULTIVITAMIN) capsule Take 1 capsule by mouth daily.     pravastatin (PRAVACHOL) 80 MG tablet Take 1 tablet (80 mg total) by mouth daily.     VITAMIN E PO Take by mouth.     No current facility-administered medications for this visit.    OBJECTIVE: White woman in no acute distress Vitals:  05/04/22 1147  BP: (!) 126/96  Pulse: 68  Resp: 14  Temp: (!) 97.5 F (36.4 C)  SpO2: 100%      Body mass index is 22.75 kg/m.    ECOG FS:1 - Symptomatic but completely ambulatory  General appearance: Alert, oriented and in no acute distress Neck: No cervical supraclavicular adenopathy Breast: Bilateral breasts inspected and palpated, density noted.  No palpable masses or regional adenopathy  LAB RESULTS:  CMP     Component Value Date/Time   NA 142 05/03/2021 1122   NA 140 12/07/2016 1504   K 3.8 05/03/2021 1122   K 3.8 12/07/2016 1504   CL 104 05/03/2021 1122   CO2 28 05/03/2021 1122   CO2 28 12/07/2016 1504   GLUCOSE 82 05/03/2021 1122   GLUCOSE 99 12/07/2016 1504   BUN 16 05/03/2021 1122   BUN 16.3 12/07/2016 1504   CREATININE 1.00 05/03/2021 1122   CREATININE 0.8 12/07/2016 1504   CALCIUM 9.2 05/03/2021 1122   CALCIUM 8.9 12/07/2016 1504   PROT 6.6 05/03/2021 1122   PROT 6.5 12/07/2016 1504   ALBUMIN 3.8 05/03/2021 1122   ALBUMIN 3.6 12/07/2016 1504   AST 23 05/03/2021 1122   AST 21 12/07/2016 1504   ALT 17 05/03/2021 1122   ALT 18 12/07/2016 1504   ALKPHOS 55 05/03/2021 1122   ALKPHOS 50 12/07/2016 1504   BILITOT 0.7  05/03/2021 1122   BILITOT 0.61 12/07/2016 1504   GFRNONAA >60 05/03/2021 1122   GFRAA >60 04/30/2020 1412    INo results found for: "SPEP", "UPEP"  Lab Results  Component Value Date   WBC 5.5 05/03/2021   NEUTROABS 3.4 05/03/2021   HGB 12.5 05/03/2021   HCT 37.3 05/03/2021   MCV 96.1 05/03/2021   PLT 185 05/03/2021      Chemistry      Component Value Date/Time   NA 142 05/03/2021 1122   NA 140 12/07/2016 1504   K 3.8 05/03/2021 1122   K 3.8 12/07/2016 1504   CL 104 05/03/2021 1122   CO2 28 05/03/2021 1122   CO2 28 12/07/2016 1504   BUN 16 05/03/2021 1122   BUN 16.3 12/07/2016 1504   CREATININE 1.00 05/03/2021 1122   CREATININE 0.8 12/07/2016 1504      Component Value Date/Time   CALCIUM 9.2 05/03/2021 1122   CALCIUM 8.9 12/07/2016 1504   ALKPHOS 55 05/03/2021 1122   ALKPHOS 50 12/07/2016 1504   AST 23 05/03/2021 1122   AST 21 12/07/2016 1504   ALT 17 05/03/2021 1122   ALT 18 12/07/2016 1504   BILITOT 0.7 05/03/2021 1122   BILITOT 0.61 12/07/2016 1504       No results found for: "LABCA2"  No components found for: "LABCA125"  No results for input(s): "INR" in the last 168 hours.  Urinalysis    Component Value Date/Time   COLORURINE YELLOW 10/28/2019 1604   APPEARANCEUR CLEAR 10/28/2019 1604   LABSPEC 1.021 10/28/2019 1604   PHURINE 6.5 10/28/2019 1604   GLUCOSEU NEGATIVE 10/28/2019 1604   HGBUR NEGATIVE 10/28/2019 1604   BILIRUBINUR NEGATIVE 01/23/2017 1628   KETONESUR TRACE (A) 10/28/2019 1604   PROTEINUR NEGATIVE 10/28/2019 1604   UROBILINOGEN 0.2 02/10/2014 0846   NITRITE NEGATIVE 01/23/2017 1628   LEUKOCYTESUR NEGATIVE 01/23/2017 1628    ELIGIBLE FOR AVAILABLE RESEARCH PROTOCOL: no   STUDIES: US AORTA  Result Date: 04/28/2022 CLINICAL DATA:  Family history of abdominal aortic aneurysm sent for evaluation. EXAM: ULTRASOUND OF ABDOMINAL AORTA TECHNIQUE: Ultrasound  examination of the abdominal aorta and proximal common iliac arteries was  performed to evaluate for aneurysm. Additional color and Doppler images of the distal aorta were obtained to document patency. COMPARISON:  None Available. FINDINGS: Abdominal aortic measurements as follows: Proximal:  2.2 cm (AP) /2.0 cm (transverse) Mid:  1.9 cm (AP) /2.0 cm (transverse) Distal:  1.8 cm (AP) /1.8 cm (transverse) Patent: Yes, peak systolic velocity is 94 cm/s Right common iliac artery: 1.3 cm (AP) /1.1 cm (transverse) Left common iliac artery: 1.1 cm (AP) /1.1 cm (transverse) IMPRESSION: No evidence of an abdominal aortic aneurysm. Electronically Signed   By: Virgina Norfolk M.D.   On: 04/28/2022 22:21     ASSESSMENT: 71 y.o. [place] woman status post right breast upper outer quadrant biopsy 09/01/2015 for a clinical T1a N0, stage IA invasive ductal carcinoma, grade 1, estrogen and progesterone receptor positive, HER-2 not amplified, with an MIB-1 of 10%  (1) genetics testing 10/19/2015 through the Breast/Ovarian gene panel offered by [facility] found no deleterious mutations in  ATM, BARD1, BRCA1, BRCA2, BRIP1, CDH1, EPCAM, FANCC, MLH1, MSH2, MSH6, NBN, PALB2, PMS2, PTEN, RAD51C, RAD51D, TP53, and XRCC2.  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   (b) Genetic testing also detected a Variant of Unknown Significance in the BRCA2 gene called [id number].  (c) breast cancer intensified screening will consist of breast MRI in July and breast mammography in January yearly  (d) colon cancer screening will consist of colonoscopy every 5 years or more often depending on findings   (i) most recent colonoscopy 07/28/2013; no polyps noted  (2) status post right lumpectomy with sentinel lymph node sampling 09/30/2015 for a pTis pN0, stage 0 ductal carcinoma in situ, grade 2, with negative margins  (3) adjuvant radiation (mammosite) completed May 2017  (4) started tamoxifen 01/06/2016, switched to anastrozole starting 12/06/2018  (a)  endometrial biopsy 10/11/2016 shows scant tissue with an atrophic pattern.  (b) switched from anastrozole to exemestane August 2020 due to side effects  (c) discontinued exemestane October 2020 due to side effects  (d) resumed tamoxifen now at 10 mg daily beginning 07/01/2019, completed 5 yrs  (5) [facility] density at [facility] 01/22/2017 shows a T score of -2.0  (a) repeat bone density 05/16/2019 at [facility] shows a T score of -2.0 (stable)    PLAN: Patient is here for follow-up.  She has now completed 5 years of antiestrogen therapy. No new changes in the breast concerning for recurrence. Last mammogram, density noted C, no obvious evidence of malignancy. She will be due for MRI in December however would like to postpone to January since she has cataract surgery anticipated in mid December Self breast exam recommended monthly, encouraged with her current active lifestyle.  She should return to clinic in 1 year or sooner as needed we have discussed about unknown long-term risk of gadolinium and increased sensitivity of MRI needing additional biopsies from time to time.    *Total Encounter Time as defined by the Centers for Medicare and Medicaid Services includes, in addition to the face-to-face time of a patient visit (documented in the note above) non-face-to-face time: obtaining and reviewing outside history, ordering and reviewing medications, tests or procedures, care coordination (communications with other health care professionals or caregivers) and documentation in the medical record.

## 2022-05-11 IMAGING — MR MR BREAST BILAT WO/W CM
8 of 12 series · 33 of 48 positions shown · IV contrast (6 ml gadavist)
Comparison: Prior MRI on 07/26/2020 and mammogram on 02/14/2021 and
earlier

CLINICAL DATA: Personal history of RIGHT breast cancer, status post
lumpectomy in 5995. Patient has family history of breast cancer.

EXAM:
BILATERAL BREAST MRI WITH AND WITHOUT CONTRAST
TECHNIQUE: Multiplanar, multisequence MR images of both breasts were obtained
prior to and following the intravenous administration of 6 ml of
Gadavist

[Series 2: t2_tirm_tra ipat (a-p) · axial · 3.0mm · 0.70mm/px · 1 of 55 slices shown]
[im 1/55]
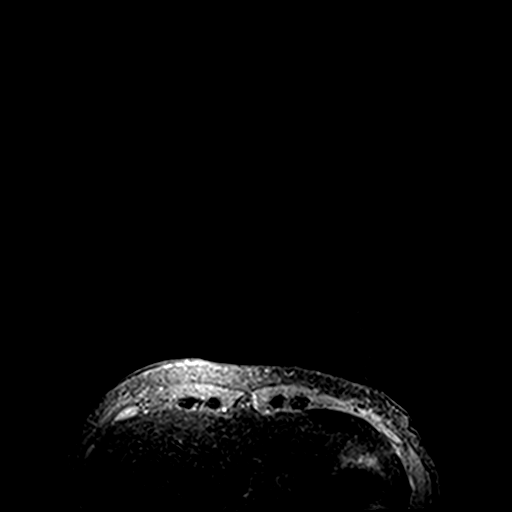

[Series 3: fl3d pre-cm no · axial · non-contrast · 1.2mm · 0.94mm/px · z∈[-84,+88]mm · 5 of 144 slices shown]
[im 1/144]
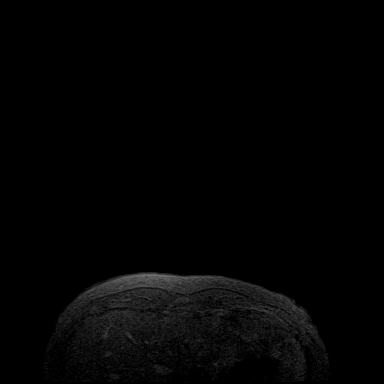
[im 36/144]
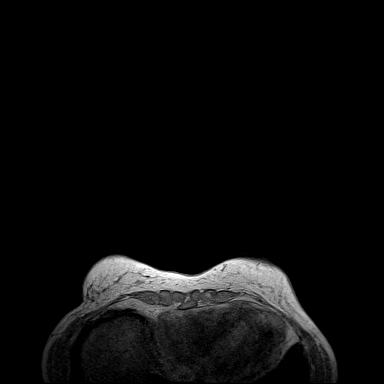
[im 72/144]
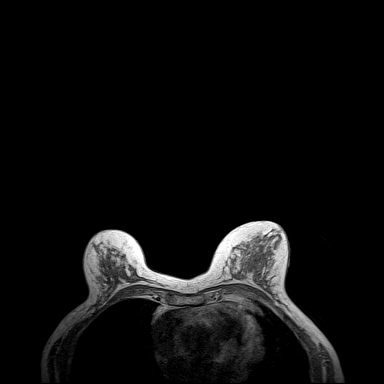
[im 108/144]
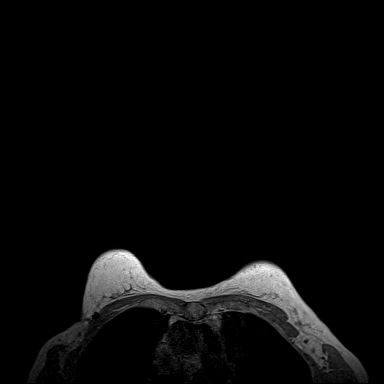
[im 144/144]
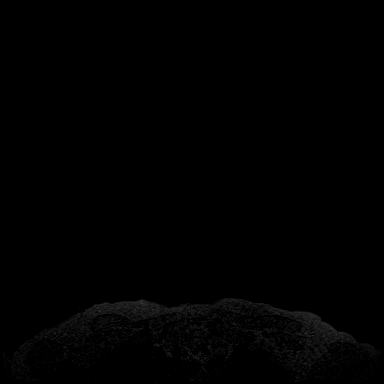

[Series 4: fl3d pre-cm · axial · non-contrast · 1.2mm · 0.94mm/px · z∈[-84,+88]mm · 5 of 144 slices shown]
[im 1/144]
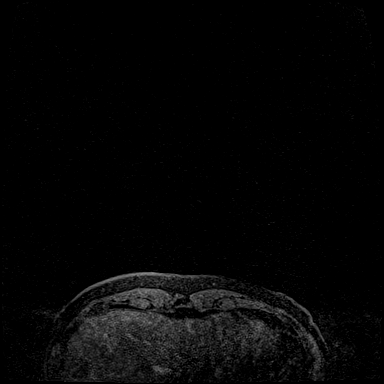
[im 36/144]
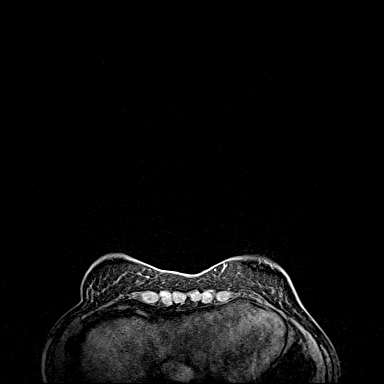
[im 72/144]
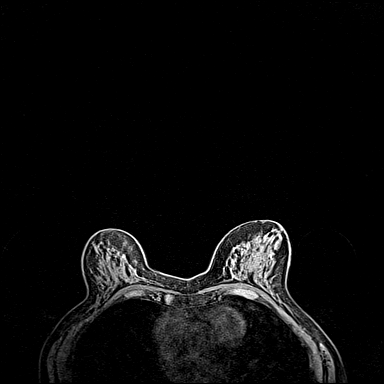
[im 108/144]
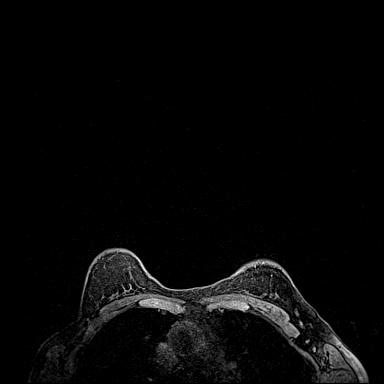
[im 144/144]
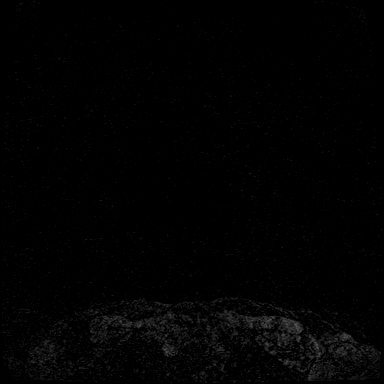

[Series 5: fl3d post immediate · axial · 1.2mm · 0.94mm/px · z∈[-84,+88]mm · 5 of 144 slices shown (1 of 3)]
[im 1/144]
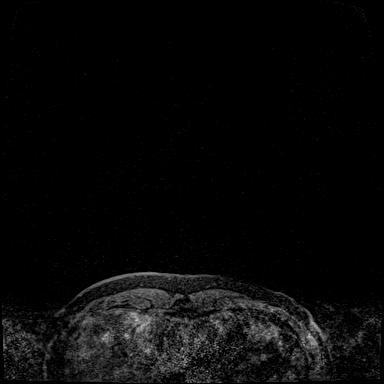
[im 36/144]
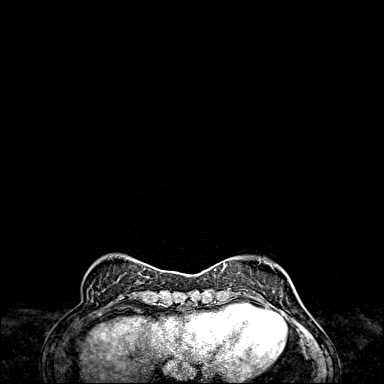
[im 72/144]
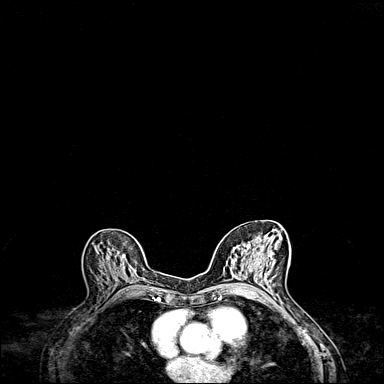
[im 108/144]
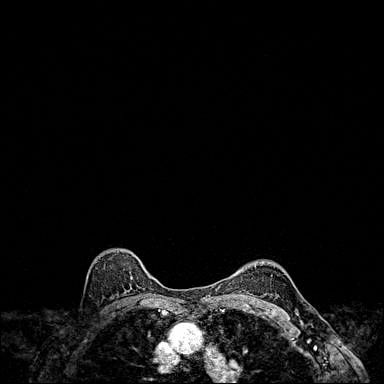
[im 144/144]
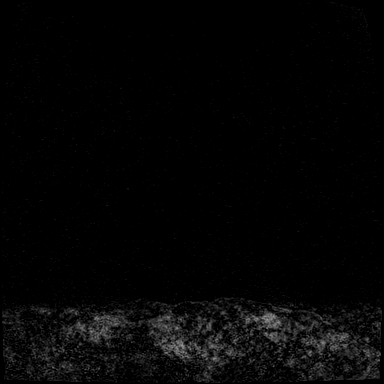

[Series 6: fl3d post immediate · axial · 1.2mm · 0.94mm/px · z∈[-84,+88]mm · 5 of 144 slices shown (2 of 3)]
[im 1/144]
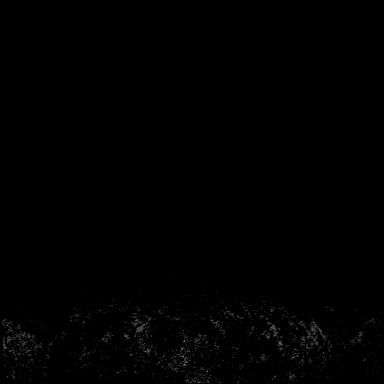
[im 36/144]
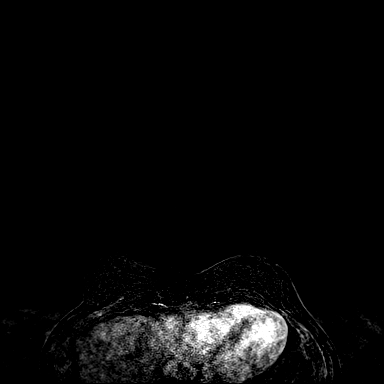
[im 72/144]
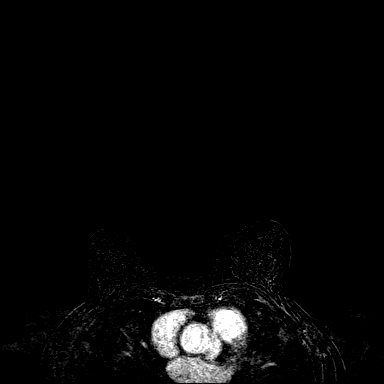
[im 108/144]
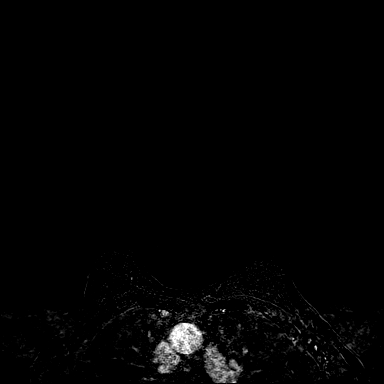
[im 144/144]
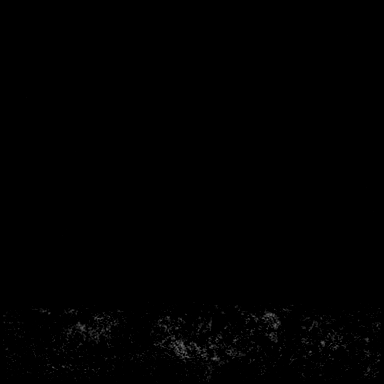

[Series 7: fl3d post immediate · axial · 172.8mm · 0.94mm/px · 1 of 1 slices shown (3 of 3)]
[im 1/1]
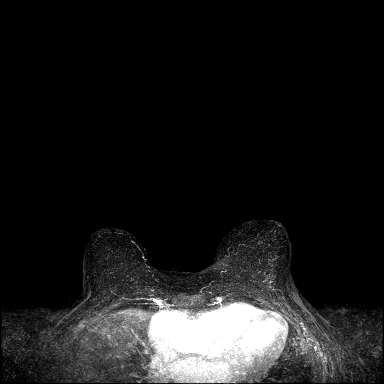

[Series 8: fl3d post 3min · axial · 1.2mm · 0.94mm/px · z∈[-84,+88]mm · 6 of 144 slices shown]
[im 1/144]
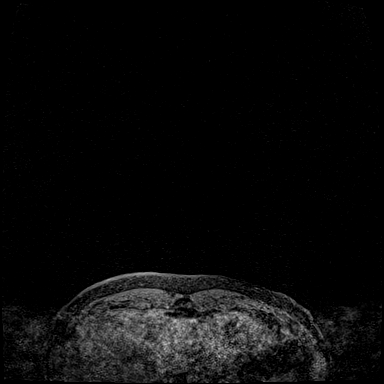
[im 29/144]
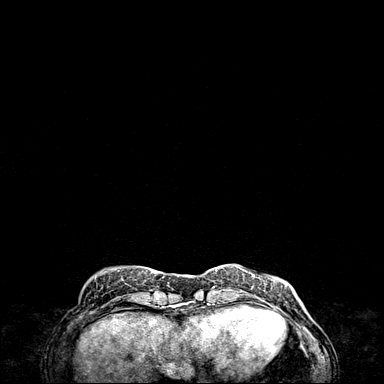
[im 58/144]
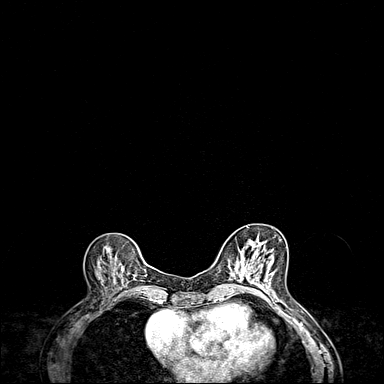
[im 86/144]
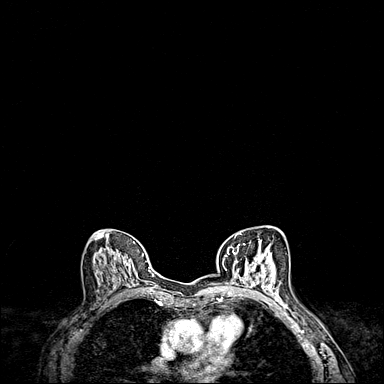
[im 115/144]
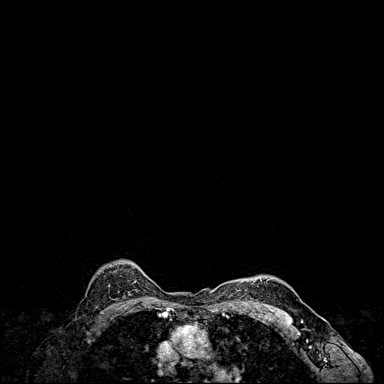
[im 144/144]
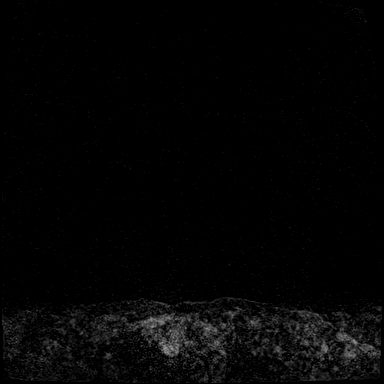

[Series 9: fl3d post 3min_sub · axial · 1.2mm · 0.94mm/px · z∈[-84,+53]mm · 5 of 144 slices shown]
[im 1/144]
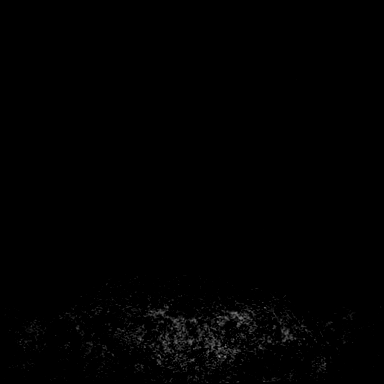
[im 29/144]
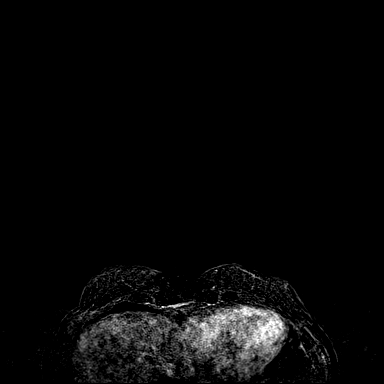
[im 58/144]
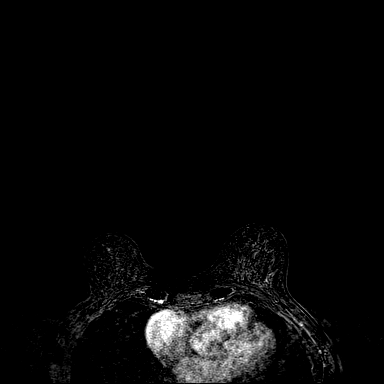
[im 86/144]
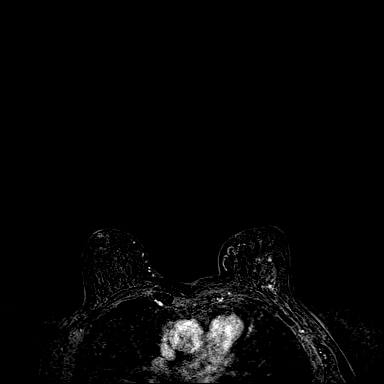
[im 115/144]
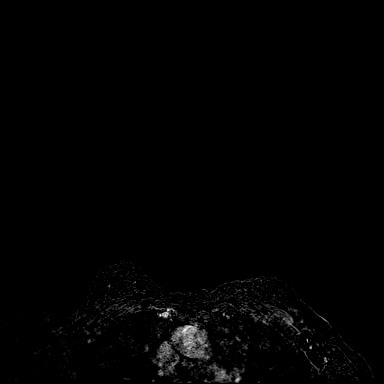

[33 of 48 positions shown; findings below may reference images not displayed]

Three-dimensional MR images were rendered by post-processing of the
original MR data on an independent workstation. The
three-dimensional MR images were interpreted, and findings are
reported in the following complete MRI report for this study. Three
dimensional images were evaluated at the independent interpreting
workstation using the DynaCAD thin client.
FINDINGS: Breast composition: c. Heterogeneous fibroglandular tissue.

Background parenchymal enhancement: Minimal

Right breast: No mass or abnormal enhancement. Postoperative changes
are seen within the LATERAL aspect of the RIGHT breast.

Left breast: No mass or abnormal enhancement.

Lymph nodes: No abnormal appearing lymph nodes.

Ancillary findings:  None.
IMPRESSION: No MRI evidence for malignancy in either breast.

Expected postoperative changes in the LATERAL portion of the RIGHT
breast.

RECOMMENDATION:
Recommend screening mammogram in Friday February, 2022.

BI-RADS CATEGORY  2: Benign.

## 2022-05-18 DIAGNOSIS — H524 Presbyopia: Secondary | ICD-10-CM | POA: Diagnosis not present

## 2022-05-24 DIAGNOSIS — M25562 Pain in left knee: Secondary | ICD-10-CM | POA: Diagnosis not present

## 2022-05-24 DIAGNOSIS — M25561 Pain in right knee: Secondary | ICD-10-CM | POA: Diagnosis not present

## 2022-05-24 DIAGNOSIS — M170 Bilateral primary osteoarthritis of knee: Secondary | ICD-10-CM | POA: Diagnosis not present

## 2022-07-02 DIAGNOSIS — J0101 Acute recurrent maxillary sinusitis: Secondary | ICD-10-CM | POA: Diagnosis not present

## 2022-07-13 DIAGNOSIS — Z5181 Encounter for therapeutic drug level monitoring: Secondary | ICD-10-CM | POA: Diagnosis not present

## 2022-07-13 DIAGNOSIS — E785 Hyperlipidemia, unspecified: Secondary | ICD-10-CM | POA: Diagnosis not present

## 2022-07-13 DIAGNOSIS — Z1159 Encounter for screening for other viral diseases: Secondary | ICD-10-CM | POA: Diagnosis not present

## 2022-07-21 DIAGNOSIS — H2512 Age-related nuclear cataract, left eye: Secondary | ICD-10-CM | POA: Diagnosis not present

## 2022-07-26 DIAGNOSIS — H5202 Hypermetropia, left eye: Secondary | ICD-10-CM | POA: Diagnosis not present

## 2022-07-26 DIAGNOSIS — H52223 Regular astigmatism, bilateral: Secondary | ICD-10-CM | POA: Diagnosis not present

## 2022-07-26 DIAGNOSIS — H2512 Age-related nuclear cataract, left eye: Secondary | ICD-10-CM | POA: Diagnosis not present

## 2022-08-23 ENCOUNTER — Ambulatory Visit (HOSPITAL_COMMUNITY)
Admission: RE | Admit: 2022-08-23 | Discharge: 2022-08-23 | Disposition: A | Discharge status: Home / Self Care | Payer: Medicare HMO | Source: Ambulatory Visit | Attending: Hematology and Oncology | Admitting: Hematology and Oncology

## 2022-08-23 DIAGNOSIS — C50411 Malignant neoplasm of upper-outer quadrant of right female breast: Secondary | ICD-10-CM | POA: Diagnosis not present

## 2022-08-23 DIAGNOSIS — Z170 Estrogen receptor positive status [ER+]: Secondary | ICD-10-CM | POA: Insufficient documentation

## 2022-08-23 DIAGNOSIS — Z1239 Encounter for other screening for malignant neoplasm of breast: Secondary | ICD-10-CM | POA: Diagnosis not present

## 2022-08-23 DIAGNOSIS — Z1509 Genetic susceptibility to other malignant neoplasm: Secondary | ICD-10-CM | POA: Diagnosis not present

## 2022-08-23 DIAGNOSIS — Z1589 Genetic susceptibility to other disease: Secondary | ICD-10-CM | POA: Diagnosis not present

## 2022-08-23 DIAGNOSIS — Z1502 Genetic susceptibility to malignant neoplasm of ovary: Secondary | ICD-10-CM | POA: Insufficient documentation

## 2022-08-23 DIAGNOSIS — C50919 Malignant neoplasm of unspecified site of unspecified female breast: Secondary | ICD-10-CM | POA: Insufficient documentation

## 2022-08-23 MED ORDER — GADOBUTROL 1 MMOL/ML IV SOLN
6.0000 mL | Freq: Once | INTRAVENOUS | Status: AC | PRN
  Administered 2022-08-23: 6 mL via INTRAVENOUS

## 2022-08-24 ENCOUNTER — Other Ambulatory Visit: Payer: Self-pay | Admitting: Hematology and Oncology

## 2022-08-24 ENCOUNTER — Telehealth: Payer: Self-pay | Admitting: Hematology and Oncology

## 2022-08-24 DIAGNOSIS — C50411 Malignant neoplasm of upper-outer quadrant of right female breast: Secondary | ICD-10-CM

## 2022-08-24 NOTE — Telephone Encounter (Signed)
I called her, no answer left a voicemail the intention of call. I will try to call her back.

## 2022-08-29 DIAGNOSIS — Z853 Personal history of malignant neoplasm of breast: Secondary | ICD-10-CM | POA: Diagnosis not present

## 2022-08-29 DIAGNOSIS — N6489 Other specified disorders of breast: Secondary | ICD-10-CM | POA: Diagnosis not present

## 2022-08-29 DIAGNOSIS — R922 Inconclusive mammogram: Secondary | ICD-10-CM | POA: Diagnosis not present

## 2022-08-30 DIAGNOSIS — L820 Inflamed seborrheic keratosis: Secondary | ICD-10-CM | POA: Diagnosis not present

## 2022-08-30 DIAGNOSIS — D2262 Melanocytic nevi of left upper limb, including shoulder: Secondary | ICD-10-CM | POA: Diagnosis not present

## 2022-08-30 DIAGNOSIS — D1801 Hemangioma of skin and subcutaneous tissue: Secondary | ICD-10-CM | POA: Diagnosis not present

## 2022-08-30 DIAGNOSIS — L814 Other melanin hyperpigmentation: Secondary | ICD-10-CM | POA: Diagnosis not present

## 2022-08-30 DIAGNOSIS — L570 Actinic keratosis: Secondary | ICD-10-CM | POA: Diagnosis not present

## 2022-08-30 DIAGNOSIS — L821 Other seborrheic keratosis: Secondary | ICD-10-CM | POA: Diagnosis not present

## 2022-09-07 ENCOUNTER — Telehealth: Payer: Self-pay | Admitting: Hematology and Oncology

## 2022-09-07 NOTE — Telephone Encounter (Signed)
Contacted patient to scheduled appointments. Left message with appointment details and a call back number if patient had any questions or could not accommodate the time we provided.

## 2022-09-11 ENCOUNTER — Inpatient Hospital Stay: Payer: Medicare HMO | Attending: Hematology and Oncology | Admitting: Hematology and Oncology

## 2022-09-11 ENCOUNTER — Encounter: Payer: Self-pay | Admitting: Hematology and Oncology

## 2022-09-11 DIAGNOSIS — Z170 Estrogen receptor positive status [ER+]: Secondary | ICD-10-CM | POA: Diagnosis not present

## 2022-09-11 DIAGNOSIS — C50411 Malignant neoplasm of upper-outer quadrant of right female breast: Secondary | ICD-10-CM

## 2022-09-11 NOTE — Progress Notes (Signed)
Tina Nielsen  [phone]) [phone] [phone]    ID: MIRTIE BASTYR DOB: [birth year]  MR#: [id number]  JQZ#:[id number]  Patient Care Team: Saintclair Halsted, FNP as PCP - General (Family Medicine) Magrinat, Virgie Dad, MD (Inactive) as Consulting Physician (Oncology) Rolm Bookbinder, MD as Consulting Physician (General Surgery) Thea Silversmith, MD as Consulting Physician (Radiation Oncology) Christene Slates, MD (Inactive) as Physician Assistant (Radiology) Fontaine, Belinda Block, MD (Inactive) as Consulting Physician (Gynecology) Huel Cote, NP (Inactive) as Nurse Practitioner (Obstetrics and Gynecology) OTHER MD:  CHIEF COMPLAINT: Estrogen receptor positive breast cancer  CURRENT TREATMENT: Completed antiestrogen therapy   INTERVAL HISTORY: Ms. Tina Nielsen is here for telephone follow-up.  Since her last visit, she she had MRI breast which showed left axillary lymph node enlargement.  She also had ultrasound which showed borderline enlarged lymph nodes.  Recommendation was to proceed with a diagnostic mammogram that has been scheduled and these lymph nodes were thought to be reactive.  She denies any other complaints today.  COVID 19 VACCINATION STATUS: Pfizer x4, also had COVID February 2022 and 2   BREAST CANCER HISTORY:  From the original intake note:  "Tina Nielsen" noted a possible lump in her left breast and was referred for diagnostic mammography with tomosynthesis at [facility] 08/17/2015. The breast composition was category D. There was no finding in the left breast to correlate with the patient's lower inner quadrant concern. Ultrasound was obtained the same day and was likewise unremarkable.   However in the right breast upper outer quadrant posteriorly there was a 4 mm area of grouped calcifications. This was felt to warrant biopsy, which was performed stereotactically 09/01/2015. The final pathology ([facility] [id number]) showed an invasive ductal carcinoma, grade 1,  estrogen receptor 100% positive, progesterone receptor 90% positive, both with strong staining intensity, with an MIB-1 of 10%, and no HER-2 amplification, the signals ratio being 1.17 and the number per cell 2.35.  Bilateral breast MRI was obtained 09/06/2015. This describes the breast density as category C. In the upper outer quadrant of the right breast there was a clip. There was no suspicious surrounding enhancement. More laterally there was a 5.1 cm fluid collection compatible with a hematoma. The left breast was normal and there was no abnormal appearing adenopathy.   Her subsequent history is as detailed below.   PAST MEDICAL HISTORY: Past Medical History:  Diagnosis Date   Anxiety    Arthritis    knees   Atrophic vaginitis    Breast cancer of upper-outer quadrant of right female breast ([place]) 09/03/2015   Family history of breast cancer    High cholesterol    Osteopenia     PAST SURGICAL HISTORY: Past Surgical History:  Procedure Laterality Date   BREAST LUMPECTOMY WITH NEEDLE LOCALIZATION AND AXILLARY SENTINEL LYMPH NODE BX Right 09/30/2015   Procedure: RIGHT BREAST LUMPECTOMY WITH NEEDLE LOCALIZATION AND AXILLARY SENTINEL LYMPH NODE BX;  Surgeon: Rolm Bookbinder, MD;  Location: [facility];  Service: General;  Laterality: Right;   BREAST SURGERY     BIOPSY   cataract surg Right    KNEE SURGERY     TUBAL LIGATION      FAMILY HISTORY Family History  Problem Relation Age of Onset   Hypertension Mother    Breast cancer Mother 17       second cancer at 67   Heart disease Mother    Aneurysm Father        AORTIC   Kidney cancer Maternal Aunt  34   Breast cancer Paternal Aunt    Prostate cancer Paternal Uncle 41   Stroke Maternal Grandmother    Heart disease Maternal Grandfather    Breast cancer Other        MGMs sister   Breast cancer Other        PGMs sister   Breast cancer Other        PGF sister   Colon cancer Neg Hx   The patient's father died  from a ruptured aortic aneurysm the age of 15. The patient's mother was diagnosed with breast cancer at age 18. She had a second breast cancer diagnosed at age 73.She died at age 58. The patient had one brother, no sisters. As far as she is aware that there are no other breast or ovarian cancer instances in the family   GYNECOLOGIC HISTORY:  No LMP recorded. Patient is postmenopausal. Menarche age 43, first live birth age 20, which the patient knows increases the risk of breast cancer. She is GX P2. She stopped having periods around age 41. She did not take hormone replacement. 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 Her husband [name] taught history as well as coached basketball at the same school. He is now retired but is very active in the [employer]. He knows Tina Nielsen well and has quite a few stories to tell regarding [facility] basketball. Their son Tina Nielsen lives in [place] and works in Designer, television/film set as a Environmental manager. Son Tina Nielsen lives in [place] working as a Designer, jewellery. The children have been made aware of the CHEK2 mutation and are considering testing.The patient has two grandchildren.    ADVANCED DIRECTIVES: In place   HEALTH MAINTENANCE: Social History   Tobacco Use   Smoking status: Former    Types: Cigarettes    Quit date: 08/07/1976    Years since quitting: 46.1   Smokeless tobacco: Never  Vaping Use   Vaping Use: Never used  Substance Use Topics   Alcohol use: Yes    Alcohol/week: 7.0 standard drinks of alcohol    Types: 7 Glasses of wine per week   Drug use: No     Colonoscopy: 2014/[facility]  PAP:  Bone density: June 2018, -2.0 osteopenia  Lipid panel:  Allergies  Allergen Reactions   Aleve [Naproxen] Other (See Comments)    "stomach irritation"   Celebrex [Celecoxib]     Irritated stomach- caused burning    Ibuprofen     "UPSET STOMACH"- burning    Current Outpatient Medications  Medication Sig Dispense Refill   acetaminophen (TYLENOL) 500 MG tablet Take 500 mg by mouth every 6 (six) hours as needed.     Apoaequorin (PREVAGEN PO) Take by mouth.     atorvastatin (LIPITOR) 80 MG tablet Take 80 mg by mouth daily.     Calcium Carbonate-Vitamin D (CALCIUM + D PO) Take by mouth.     escitalopram (LEXAPRO) 10 MG tablet Take 1 tablet by mouth daily.     mometasone (NASONEX) 50 MCG/ACT nasal spray Place 2 sprays into the nose daily.     Multiple Vitamin (MULTIVITAMIN) capsule Take 1 capsule by mouth daily.     VITAMIN E PO Take by mouth.     No current facility-administered medications for this visit.    OBJECTIVE: White woman in no acute distress There were no vitals filed for this  visit.     There is no height or weight on file to calculate BMI.    ECOG FS:1 - Symptomatic but completely ambulatory   Physical exam today deferred in lieu of counseling  LAB RESULTS:  CMP     Component Value Date/Time   NA 142 05/03/2021 1122   NA 140 12/07/2016 1504   K 3.8 05/03/2021 1122   K 3.8 12/07/2016 1504   CL 104 05/03/2021 1122   CO2 28 05/03/2021 1122   CO2 28 12/07/2016 1504   GLUCOSE 82 05/03/2021 1122   GLUCOSE 99 12/07/2016 1504   BUN 16 05/03/2021 1122   BUN 16.3 12/07/2016 1504   CREATININE 1.00 05/03/2021 1122   CREATININE 0.8 12/07/2016 1504   CALCIUM 9.2 05/03/2021 1122   CALCIUM 8.9 12/07/2016 1504   PROT 6.6 05/03/2021 1122   PROT 6.5 12/07/2016 1504   ALBUMIN 3.8 05/03/2021 1122   ALBUMIN 3.6 12/07/2016 1504   AST 23 05/03/2021 1122   AST 21 12/07/2016 1504   ALT 17 05/03/2021 1122   ALT 18 12/07/2016 1504   ALKPHOS 55 05/03/2021 1122   ALKPHOS 50 12/07/2016 1504   BILITOT 0.7 05/03/2021 1122   BILITOT 0.61 12/07/2016 1504   GFRNONAA >60 05/03/2021 1122   GFRAA >60 04/30/2020 1412    INo results found for: "SPEP", "UPEP"  Lab Results  Component Value Date    WBC 5.5 05/03/2021   NEUTROABS 3.4 05/03/2021   HGB 12.5 05/03/2021   HCT 37.3 05/03/2021   MCV 96.1 05/03/2021   PLT 185 05/03/2021      Chemistry      Component Value Date/Time   NA 142 05/03/2021 1122   NA 140 12/07/2016 1504   K 3.8 05/03/2021 1122   K 3.8 12/07/2016 1504   CL 104 05/03/2021 1122   CO2 28 05/03/2021 1122   CO2 28 12/07/2016 1504   BUN 16 05/03/2021 1122   BUN 16.3 12/07/2016 1504   CREATININE 1.00 05/03/2021 1122   CREATININE 0.8 12/07/2016 1504      Component Value Date/Time   CALCIUM 9.2 05/03/2021 1122   CALCIUM 8.9 12/07/2016 1504   ALKPHOS 55 05/03/2021 1122   ALKPHOS 50 12/07/2016 1504   AST 23 05/03/2021 1122   AST 21 12/07/2016 1504   ALT 17 05/03/2021 1122   ALT 18 12/07/2016 1504   BILITOT 0.7 05/03/2021 1122   BILITOT 0.61 12/07/2016 1504       No results found for: "LABCA2"  No components found for: "LABCA125"  No results for input(s): "INR" in the last 168 hours.  Urinalysis    Component Value Date/Time   COLORURINE YELLOW 10/28/2019 1604   APPEARANCEUR CLEAR 10/28/2019 1604   LABSPEC 1.021 10/28/2019 1604   PHURINE 6.5 10/28/2019 1604   GLUCOSEU NEGATIVE 10/28/2019 1604   HGBUR NEGATIVE 10/28/2019 1604   BILIRUBINUR NEGATIVE 01/23/2017 1628   KETONESUR TRACE (A) 10/28/2019 1604   PROTEINUR NEGATIVE 10/28/2019 1604   UROBILINOGEN 0.2 02/10/2014 0846   NITRITE NEGATIVE 01/23/2017 1628   LEUKOCYTESUR NEGATIVE 01/23/2017 1628    ELIGIBLE FOR AVAILABLE RESEARCH PROTOCOL: no   STUDIES: MR BREAST BILATERAL W WO CONTRAST INC CAD  Result Date: 08/24/2022 CLINICAL DATA:  High risk screening MRI. The patient has a lifetime risk of breast cancer estimated at 30% by the [place]. She has both CHEK2 and (VUS)BRCA2 gene mutations. She has personal history of right breast cancer status post lumpectomy in 2017. Her initial biopsy demonstrated grade  1 invasive ductal carcinoma with pathology on final excision showing grade  2 DCIS. She has family history of breast cancer in her mother initially diagnosed at age 5. EXAM: BILATERAL BREAST MRI WITH AND WITHOUT CONTRAST TECHNIQUE: Multiplanar, multisequence MR images of both breasts were obtained prior to and following the intravenous administration of 6 ml of Gadavist Three-dimensional MR images were rendered by post-processing of the original MR data on an independent workstation. The three-dimensional MR images were interpreted, and findings are reported in the following complete MRI report for this study. Three dimensional images were evaluated at the independent interpreting workstation using the DynaCAD thin client. COMPARISON:  Previous exam(s). FINDINGS: Breast composition: c. Heterogeneous fibroglandular tissue. Background parenchymal enhancement: Minimal Right breast: No mass or abnormal enhancement. Stable surgical changes. Left breast: No mass or abnormal enhancement. Lymph nodes: There is interval enlargement of several left axillary lymph nodes, and stranding/edema within the surrounding subcutaneous tissue. No abnormal appearing lymph nodes in the right axilla. Ancillary findings: There is a stable 1 cm T2 bright cyst within the liver. IMPRESSION: 1.  Interval enlargement of multiple left axillary lymph nodes. 2.  No findings of malignancy in either breast. RECOMMENDATION: Diagnostic left breast mammogram and left axillary ultrasound. If enlarged lymph nodes are found on ultrasound as suspected, then ultrasound guided biopsy is recommended. BI-RADS CATEGORY  4: Suspicious. Electronically Signed   By: Ammie Ferrier M.D.   On: 08/24/2022 09:54    ASSESSMENT: 72 y.o. [place] woman status post right breast upper outer quadrant biopsy 09/01/2015 for a clinical T1a N0, stage IA invasive ductal carcinoma, grade 1, estrogen and progesterone receptor positive, HER-2 not amplified, with an MIB-1 of 10%  (1) genetics testing 10/19/2015 through the Breast/Ovarian gene panel  offered by [facility] found no deleterious mutations in  ATM, BARD1, BRCA1, BRCA2, BRIP1, CDH1, EPCAM, FANCC, MLH1, MSH2, MSH6, NBN, PALB2, PMS2, PTEN, RAD51C, RAD51D, TP53, and XRCC2.  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   (b) Genetic testing also detected a Variant of Unknown Significance in the BRCA2 gene called [id number].  (c) breast cancer intensified screening will consist of breast MRI in July and breast mammography in January yearly  (d) colon cancer screening will consist of colonoscopy every 5 years or more often depending on findings   (i) most recent colonoscopy 07/28/2013; no polyps noted  (2) status post right lumpectomy with sentinel lymph node sampling 09/30/2015 for a pTis pN0, stage 0 ductal carcinoma in situ, grade 2, with negative margins  (3) adjuvant radiation (mammosite) completed May 2017  (4) started tamoxifen 01/06/2016, switched to anastrozole starting 12/06/2018  (a) endometrial biopsy 10/11/2016 shows scant tissue with an atrophic pattern.  (b) switched from anastrozole to exemestane August 2020 due to side effects  (c) discontinued exemestane October 2020 due to side effects  (d) resumed tamoxifen now at 10 mg daily beginning 07/01/2019, completed 5 yrs  (5) bone density at [facility] 01/22/2017 shows a T score of -2.0  (a) repeat bone density 05/16/2019 at [facility] shows a T score of -2.0 (stable)    PLAN:  Patient is here for follow-up.  Since her last visit, she had MRI which showed mildly enlarged lymph nodes in the left axilla.  She then underwent some diagnostic imaging of the breast as well as ultrasound which shows borderline enlarged lymph nodes.  She was evaluated at [facility] and recommendation was to proceed with annual diagnostic mammogram and ultrasound at the same time  to confirm that these are indeed reactive lymph nodes.  According to the patient, the radiologist did not suspect them as pathologic.   She is very comfortable with this plan.  She was of course encouraged to contact us sooner with any new questions or any changes. Total time spent: 12 minutes  I connected with  Milly Jakob on 09/11/22 by a telephone application and verified that I am speaking with the correct person using two identifiers.   I discussed the limitations of evaluation and management by telemedicine. The patient expressed understanding and agreed to proceed.    *Total Encounter Time as defined by the Centers for Medicare and Medicaid Services includes, in addition to the face-to-face time of a patient visit (documented in the note above) non-face-to-face time: obtaining and reviewing outside history, ordering and reviewing medications, tests or procedures, care coordination (communications with other health care professionals or caregivers) and documentation in the medical record.

## 2022-11-15 DIAGNOSIS — M170 Bilateral primary osteoarthritis of knee: Secondary | ICD-10-CM | POA: Diagnosis not present

## 2022-11-29 ENCOUNTER — Ambulatory Visit: Payer: Medicare HMO | Admitting: Nurse Practitioner

## 2023-02-12 DIAGNOSIS — R928 Other abnormal and inconclusive findings on diagnostic imaging of breast: Secondary | ICD-10-CM | POA: Diagnosis not present

## 2023-04-06 DIAGNOSIS — M170 Bilateral primary osteoarthritis of knee: Secondary | ICD-10-CM | POA: Diagnosis not present

## 2023-04-18 ENCOUNTER — Encounter: Payer: Self-pay | Admitting: Hematology and Oncology

## 2023-04-24 DIAGNOSIS — E785 Hyperlipidemia, unspecified: Secondary | ICD-10-CM | POA: Diagnosis not present

## 2023-04-26 DIAGNOSIS — E2839 Other primary ovarian failure: Secondary | ICD-10-CM | POA: Diagnosis not present

## 2023-04-26 DIAGNOSIS — F3342 Major depressive disorder, recurrent, in full remission: Secondary | ICD-10-CM | POA: Diagnosis not present

## 2023-04-26 DIAGNOSIS — D0510 Intraductal carcinoma in situ of unspecified breast: Secondary | ICD-10-CM | POA: Diagnosis not present

## 2023-04-26 DIAGNOSIS — Z0000 Encounter for general adult medical examination without abnormal findings: Secondary | ICD-10-CM | POA: Diagnosis not present

## 2023-04-26 DIAGNOSIS — E785 Hyperlipidemia, unspecified: Secondary | ICD-10-CM | POA: Diagnosis not present

## 2023-05-03 ENCOUNTER — Telehealth: Payer: Self-pay | Admitting: Hematology and Oncology

## 2023-05-03 NOTE — Telephone Encounter (Signed)
 Left patient a message in regards to scheduled appointment times/dates

## 2023-05-08 ENCOUNTER — Inpatient Hospital Stay: Payer: Medicare HMO | Admitting: Hematology and Oncology

## 2023-05-16 ENCOUNTER — Inpatient Hospital Stay: Payer: Medicare HMO | Attending: Hematology and Oncology | Admitting: Hematology and Oncology

## 2023-05-16 DIAGNOSIS — Z923 Personal history of irradiation: Secondary | ICD-10-CM | POA: Diagnosis not present

## 2023-05-16 DIAGNOSIS — Z1589 Genetic susceptibility to other disease: Secondary | ICD-10-CM | POA: Diagnosis not present

## 2023-05-16 DIAGNOSIS — Z1502 Genetic susceptibility to malignant neoplasm of ovary: Secondary | ICD-10-CM | POA: Diagnosis not present

## 2023-05-16 DIAGNOSIS — Z853 Personal history of malignant neoplasm of breast: Secondary | ICD-10-CM | POA: Diagnosis not present

## 2023-05-16 DIAGNOSIS — Z87891 Personal history of nicotine dependence: Secondary | ICD-10-CM | POA: Diagnosis not present

## 2023-05-16 DIAGNOSIS — C50919 Malignant neoplasm of unspecified site of unspecified female breast: Secondary | ICD-10-CM

## 2023-05-16 DIAGNOSIS — Z1509 Genetic susceptibility to other malignant neoplasm: Secondary | ICD-10-CM | POA: Diagnosis not present

## 2023-05-16 DIAGNOSIS — Z170 Estrogen receptor positive status [ER+]: Secondary | ICD-10-CM | POA: Insufficient documentation

## 2023-05-16 DIAGNOSIS — C50411 Malignant neoplasm of upper-outer quadrant of right female breast: Secondary | ICD-10-CM

## 2023-05-16 NOTE — Progress Notes (Signed)
[facility]  Telephone:(336) [phone] Fax:(336) [phone]    ID: EVLYN EDRIS DOB: 06-25-1951  MR#: [id number]  JYN#:[id number]  Patient Care Team: Camie Patience, FNP as PCP - General (Family Medicine) Magrinat, Valentino Hue, MD (Inactive) as Consulting Physician (Oncology) Emelia Loron, MD as Consulting Physician (General Surgery) Lurline Hare, MD as Consulting Physician (Radiation Oncology) Rogelia Mire, MD (Inactive) as Physician Assistant (Radiology) Fontaine, Nadyne Coombes, MD (Inactive) as Consulting Physician (Gynecology) Harrington Challenger, NP (Inactive) as Nurse Practitioner (Obstetrics and Gynecology) OTHER MD:  CHIEF COMPLAINT: Estrogen receptor positive breast cancer  CURRENT TREATMENT: Completed antiestrogen therapy   INTERVAL HISTORY:  The patient, with a history of breast cancer and a CHEK2 mutation, presents for a routine follow-up. She reports no new symptoms or concerns. She had a mammogram and ultrasound in July, which were both satisfactory. In January, an MRI showed borderline enlarged lymph nodes, which were determined to be reactive due to recent illnesses, including COVID-19, pneumonia, flu, and RSV. The patient also reports a mix-up with her medication list, with some medications listed that she does not take. She is currently on atorvastatin 80mg , Tylenol as needed, citalopram 10mg  daily, and a daily multivitamin. She also takes calcium and vitamin D supplements.   COVID 19 VACCINATION STATUS: Pfizer x4, also had COVID February 2022 and 2   BREAST CANCER HISTORY:  From the original intake note:  "[name]" noted a possible lump in her left breast and was referred for diagnostic mammography with tomosynthesis at [facility] 08/17/2015. The breast composition was category D. There was no finding in the left breast to correlate with the patient's lower inner quadrant concern. Ultrasound was obtained the same day and was likewise unremarkable.   However  in the right breast upper outer quadrant posteriorly there was a 4 mm area of grouped calcifications. This was felt to warrant biopsy, which was performed stereotactically 09/01/2015. The final pathology ([facility] [id number]) showed an invasive ductal carcinoma, grade 1, estrogen receptor 100% positive, progesterone receptor 90% positive, both with strong staining intensity, with an MIB-1 of 10%, and no HER-2 amplification, the signals ratio being 1.17 and the number per cell 2.35.  Bilateral breast MRI was obtained 09/06/2015. This describes the breast density as category C. In the upper outer quadrant of the right breast there was a clip. There was no suspicious surrounding enhancement. More laterally there was a 5.1 cm fluid collection compatible with a hematoma. The left breast was normal and there was no abnormal appearing adenopathy.   Her subsequent history is as detailed below.   PAST MEDICAL HISTORY: Past Medical History:  Diagnosis Date   Anxiety    Arthritis    knees   Atrophic vaginitis    Breast cancer of upper-outer quadrant of right female breast (HCC) 09/03/2015   Family history of breast cancer    High cholesterol    Osteopenia     PAST SURGICAL HISTORY: Past Surgical History:  Procedure Laterality Date   BREAST LUMPECTOMY WITH NEEDLE LOCALIZATION AND AXILLARY SENTINEL LYMPH NODE BX Right 09/30/2015   Procedure: RIGHT BREAST LUMPECTOMY WITH NEEDLE LOCALIZATION AND AXILLARY SENTINEL LYMPH NODE BX;  Surgeon: Emelia Loron, MD;  Location: [facility];  Service: General;  Laterality: Right;   BREAST SURGERY     BIOPSY   cataract surg Right    KNEE SURGERY     TUBAL LIGATION      FAMILY HISTORY Family History  Problem Relation Age of Onset   Hypertension  Mother    Breast cancer Mother 76       second cancer at 64   Heart disease Mother    Aneurysm Father        AORTIC   Kidney cancer Maternal Aunt 33   Breast cancer Paternal Aunt    Prostate cancer  Paternal Uncle 60   Stroke Maternal Grandmother    Heart disease Maternal Grandfather    Breast cancer Other        MGMs sister   Breast cancer Other        PGMs sister   Breast cancer Other        PGF sister   Colon cancer Neg Hx   The patient's father died from a ruptured aortic aneurysm the age of 85. The patient's mother was diagnosed with breast cancer at age 59. She had a second breast cancer diagnosed at age 18.She died at age 10. The patient had one brother, no sisters. As far as she is aware that there are no other breast or ovarian cancer instances in the family   GYNECOLOGIC HISTORY:  No LMP recorded. Patient is postmenopausal. Menarche age 72, first live birth age 72, which the patient knows increases the risk of breast cancer. She is GX P2. She stopped having periods around age 72. She did not take hormone replacement. 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 Her husband [name] taught history as well as coached basketball at the same school. He is now retired but is very active in the [employer]. He knows Sindy Guadeloupe well and has quite a few stories to tell regarding [facility] basketball. Their son Casimiro Needle lives in [place] and works in Scientist, clinical (histocompatibility and immunogenetics) as a Geneticist, molecular. Son Fayrene Fearing lives in [place] working as a Best boy. The children have been made aware of the CHEK2 mutation and are considering testing.The patient has two grandchildren.    ADVANCED DIRECTIVES: In place   HEALTH MAINTENANCE: Social History   Tobacco Use   Smoking status: Former    Current packs/day: 0.00    Types: Cigarettes    Quit date: 08/07/1976    Years since quitting: 46.8   Smokeless tobacco: Never  Vaping Use   Vaping status: Never Used  Substance Use Topics   Alcohol use: Yes    Alcohol/week: 7.0 standard drinks of alcohol    Types: 7 Glasses  of wine per week   Drug use: No     Colonoscopy: 2014/[facility]  PAP:  Bone density: June 2018, -2.0 osteopenia  Lipid panel:  Allergies  Allergen Reactions   Aleve [Naproxen] Other (See Comments)    "stomach irritation"   Celebrex [Celecoxib]     Irritated stomach- caused burning   Ibuprofen     "UPSET STOMACH"- burning    Current Outpatient Medications  Medication Sig Dispense Refill   acetaminophen (TYLENOL) 500 MG tablet Take 500 mg by mouth every 6 (six) hours as needed.     Apoaequorin (PREVAGEN PO) Take by mouth.     atorvastatin (LIPITOR) 80 MG tablet Take 80 mg by mouth daily.     Besifloxacin HCl (BESIVANCE) 0.6 % SUSP      Calcium Carb-Cholecalciferol (CALCIUM HIGH POTENCY/VITAMIN D) 600-5 MG-MCG TABS 1 tablet with food     Calcium Carbonate-Vitamin D (CALCIUM + D PO) Take by mouth.     Difluprednate (DUREZOL) 0.05 %  EMUL      escitalopram (LEXAPRO) 10 MG tablet Take 1 tablet by mouth daily.     fluticasone (FLONASE) 50 MCG/ACT nasal spray      ipratropium (ATROVENT) 0.03 % nasal spray 2 sprays 2 (two) times a day for 7 days.     mometasone (NASONEX) 50 MCG/ACT nasal spray Place 2 sprays into the nose daily.     Multiple Vitamin (MULTIVITAMIN) capsule Take 1 capsule by mouth daily.     pravastatin (PRAVACHOL) 80 MG tablet      predniSONE (DELTASONE) 20 MG tablet      predniSONE (DELTASONE) 50 MG tablet      VITAMIN E PO Take by mouth.     No current facility-administered medications for this visit.    OBJECTIVE: White woman in no acute distress There were no vitals filed for this visit.     There is no height or weight on file to calculate BMI.    ECOG FS:1 - Symptomatic but completely ambulatory  Physical Exam Constitutional:      Appearance: Normal appearance.  Chest:     Comments: Bilateral breasts inspected and palpated.  No palpable masses.  No regional adenopathy. Musculoskeletal:     Cervical back: Normal range of motion.  Lymphadenopathy:      Cervical: No cervical adenopathy.  Neurological:     Mental Status: She is alert.     LAB RESULTS:  CMP     Component Value Date/Time   NA 142 05/03/2021 1122   NA 140 12/07/2016 1504   K 3.8 05/03/2021 1122   K 3.8 12/07/2016 1504   CL 104 05/03/2021 1122   CO2 28 05/03/2021 1122   CO2 28 12/07/2016 1504   GLUCOSE 82 05/03/2021 1122   GLUCOSE 99 12/07/2016 1504   BUN 16 05/03/2021 1122   BUN 16.3 12/07/2016 1504   CREATININE 1.00 05/03/2021 1122   CREATININE 0.8 12/07/2016 1504   CALCIUM 9.2 05/03/2021 1122   CALCIUM 8.9 12/07/2016 1504   PROT 6.6 05/03/2021 1122   PROT 6.5 12/07/2016 1504   ALBUMIN 3.8 05/03/2021 1122   ALBUMIN 3.6 12/07/2016 1504   AST 23 05/03/2021 1122   AST 21 12/07/2016 1504   ALT 17 05/03/2021 1122   ALT 18 12/07/2016 1504   ALKPHOS 55 05/03/2021 1122   ALKPHOS 50 12/07/2016 1504   BILITOT 0.7 05/03/2021 1122   BILITOT 0.61 12/07/2016 1504   GFRNONAA >60 05/03/2021 1122   GFRAA >60 04/30/2020 1412    INo results found for: "SPEP", "UPEP"  Lab Results  Component Value Date   WBC 5.5 05/03/2021   NEUTROABS 3.4 05/03/2021   HGB 12.5 05/03/2021   HCT 37.3 05/03/2021   MCV 96.1 05/03/2021   PLT 185 05/03/2021      Chemistry      Component Value Date/Time   NA 142 05/03/2021 1122   NA 140 12/07/2016 1504   K 3.8 05/03/2021 1122   K 3.8 12/07/2016 1504   CL 104 05/03/2021 1122   CO2 28 05/03/2021 1122   CO2 28 12/07/2016 1504   BUN 16 05/03/2021 1122   BUN 16.3 12/07/2016 1504   CREATININE 1.00 05/03/2021 1122   CREATININE 0.8 12/07/2016 1504      Component Value Date/Time   CALCIUM 9.2 05/03/2021 1122   CALCIUM 8.9 12/07/2016 1504   ALKPHOS 55 05/03/2021 1122   ALKPHOS 50 12/07/2016 1504   AST 23 05/03/2021 1122   AST 21 12/07/2016 1504   ALT  17 05/03/2021 1122   ALT 18 12/07/2016 1504   BILITOT 0.7 05/03/2021 1122   BILITOT 0.61 12/07/2016 1504       No results found for: "LABCA2"  No components found for:  "LABCA125"  No results for input(s): "INR" in the last 168 hours.  Urinalysis    Component Value Date/Time   COLORURINE YELLOW 10/28/2019 1604   APPEARANCEUR CLEAR 10/28/2019 1604   LABSPEC 1.021 10/28/2019 1604   PHURINE 6.5 10/28/2019 1604   GLUCOSEU NEGATIVE 10/28/2019 1604   HGBUR NEGATIVE 10/28/2019 1604   BILIRUBINUR NEGATIVE 01/23/2017 1628   KETONESUR TRACE (A) 10/28/2019 1604   PROTEINUR NEGATIVE 10/28/2019 1604   UROBILINOGEN 0.2 02/10/2014 0846   NITRITE NEGATIVE 01/23/2017 1628   LEUKOCYTESUR NEGATIVE 01/23/2017 1628    ELIGIBLE FOR AVAILABLE RESEARCH PROTOCOL: no   STUDIES: No results found.   ASSESSMENT: 72 y.o. [place] woman status post right breast upper outer quadrant biopsy 09/01/2015 for a clinical T1a N0, stage IA invasive ductal carcinoma, grade 1, estrogen and progesterone receptor positive, HER-2 not amplified, with an MIB-1 of 10%  (1) genetics testing 10/19/2015 through the Breast/Ovarian gene panel offered by [facility] found no deleterious mutations in  ATM, BARD1, BRCA1, BRCA2, BRIP1, CDH1, EPCAM, FANCC, MLH1, MSH2, MSH6, NBN, PALB2, PMS2, PTEN, RAD51C, RAD51D, TP53, and XRCC2.  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   (b) Genetic testing also detected a Variant of Unknown Significance in the BRCA2 gene called [id number].  (c) breast cancer intensified screening will consist of breast MRI in July and breast mammography in January yearly  (d) colon cancer screening will consist of colonoscopy every 5 years or more often depending on findings   (i) most recent colonoscopy 07/28/2013; no polyps noted  (2) status post right lumpectomy with sentinel lymph node sampling 09/30/2015 for a pTis pN0, stage 0 ductal carcinoma in situ, grade 2, with negative margins  (3) adjuvant radiation (mammosite) completed May 2017  (4) started tamoxifen 01/06/2016, switched to anastrozole starting 12/06/2018  (a)  endometrial biopsy 10/11/2016 shows scant tissue with an atrophic pattern.  (b) switched from anastrozole to exemestane August 2020 due to side effects  (c) discontinued exemestane October 2020 due to side effects  (d) resumed tamoxifen now at 10 mg daily beginning 07/01/2019, completed 5 yrs  (5) bone density at [facility] 01/22/2017 shows a T score of -2.0  (a) repeat bone density 05/16/2019 at [facility] shows a T score of -2.0 (stable)    PLAN:  Patient is here for follow-up.   Breast Cancer Surveillance Patient with history of breast cancer and CHEK2 mutation. No new breast symptoms. Normal breast exam. Recent mammogram and ultrasound in July were normal. Prior MRI in January showed reactive lymph nodes likely secondary to recent illnesses (COVID, pneumonia, flu, RSV). -Order MRI in January for intensified screening due to increased lifetime risk from CHEK2 mutation. -Return for annual follow-up visit next year.  Hyperlipidemia Patient on Atorvastatin 80mg  daily. Recent cholesterol level was elevated. -Continue Atorvastatin 80mg  daily. -Continue regular cholesterol checks with primary care physician.  Medication Reconciliation Discrepancies noted in medication list. Patient confirmed current medications. -Update medication list to accurately reflect current medications:Atorvastatin 80mg  daily, Tylenol as needed, Citalopram 10mg  daily, Multivitamin one capsule daily, Calcium and Vitamin D.  Total time spent: 30 min  *Total Encounter Time as defined by the Centers for Medicare and Medicaid Services includes, in addition to the face-to-face time of a patient visit (documented in  the note above) non-face-to-face time: obtaining and reviewing outside history, ordering and reviewing medications, tests or procedures, care coordination (communications with other health care professionals or caregivers) and documentation in the medical record.

## 2023-05-31 DIAGNOSIS — H524 Presbyopia: Secondary | ICD-10-CM | POA: Diagnosis not present

## 2023-06-01 DIAGNOSIS — R29890 Loss of height: Secondary | ICD-10-CM | POA: Diagnosis not present

## 2023-06-01 DIAGNOSIS — Z853 Personal history of malignant neoplasm of breast: Secondary | ICD-10-CM | POA: Diagnosis not present

## 2023-06-01 DIAGNOSIS — E2839 Other primary ovarian failure: Secondary | ICD-10-CM | POA: Diagnosis not present

## 2023-06-01 DIAGNOSIS — M8588 Other specified disorders of bone density and structure, other site: Secondary | ICD-10-CM | POA: Diagnosis not present

## 2023-07-11 DIAGNOSIS — Z008 Encounter for other general examination: Secondary | ICD-10-CM | POA: Diagnosis not present

## 2023-07-30 DIAGNOSIS — M170 Bilateral primary osteoarthritis of knee: Secondary | ICD-10-CM | POA: Diagnosis not present

## 2023-08-28 ENCOUNTER — Ambulatory Visit
Admission: RE | Admit: 2023-08-28 | Discharge: 2023-08-28 | Disposition: A | Discharge status: Home / Self Care | Payer: Medicare HMO | Source: Ambulatory Visit | Attending: Hematology and Oncology | Admitting: Hematology and Oncology

## 2023-08-28 DIAGNOSIS — Z1239 Encounter for other screening for malignant neoplasm of breast: Secondary | ICD-10-CM | POA: Diagnosis not present

## 2023-08-28 DIAGNOSIS — C50919 Malignant neoplasm of unspecified site of unspecified female breast: Secondary | ICD-10-CM

## 2023-08-28 DIAGNOSIS — Z170 Estrogen receptor positive status [ER+]: Secondary | ICD-10-CM

## 2023-08-28 DIAGNOSIS — F4320 Adjustment disorder, unspecified: Secondary | ICD-10-CM | POA: Diagnosis not present

## 2023-08-28 MED ORDER — GADOPICLENOL 0.5 MMOL/ML IV SOLN
6.0000 mL | Freq: Once | INTRAVENOUS | Status: AC | PRN
  Administered 2023-08-28: 6 mL via INTRAVENOUS

## 2023-10-24 ENCOUNTER — Other Ambulatory Visit (HOSPITAL_COMMUNITY): Payer: Self-pay | Admitting: Family Medicine

## 2023-10-24 DIAGNOSIS — L821 Other seborrheic keratosis: Secondary | ICD-10-CM | POA: Diagnosis not present

## 2023-10-24 DIAGNOSIS — E785 Hyperlipidemia, unspecified: Secondary | ICD-10-CM

## 2023-10-24 DIAGNOSIS — D2262 Melanocytic nevi of left upper limb, including shoulder: Secondary | ICD-10-CM | POA: Diagnosis not present

## 2023-10-24 DIAGNOSIS — D2222 Melanocytic nevi of left ear and external auricular canal: Secondary | ICD-10-CM | POA: Diagnosis not present

## 2023-10-24 DIAGNOSIS — L814 Other melanin hyperpigmentation: Secondary | ICD-10-CM | POA: Diagnosis not present

## 2023-10-24 DIAGNOSIS — D1801 Hemangioma of skin and subcutaneous tissue: Secondary | ICD-10-CM | POA: Diagnosis not present

## 2023-10-30 ENCOUNTER — Ambulatory Visit (HOSPITAL_COMMUNITY)
Admission: RE | Admit: 2023-10-30 | Discharge: 2023-10-30 | Disposition: A | Discharge status: Home / Self Care | Payer: Self-pay | Source: Ambulatory Visit | Attending: Family Medicine | Admitting: Family Medicine

## 2023-10-30 DIAGNOSIS — E785 Hyperlipidemia, unspecified: Secondary | ICD-10-CM

## 2023-11-07 DIAGNOSIS — M1712 Unilateral primary osteoarthritis, left knee: Secondary | ICD-10-CM | POA: Diagnosis not present

## 2023-11-14 DIAGNOSIS — E785 Hyperlipidemia, unspecified: Secondary | ICD-10-CM | POA: Diagnosis not present

## 2023-11-14 DIAGNOSIS — I700 Atherosclerosis of aorta: Secondary | ICD-10-CM | POA: Diagnosis not present

## 2023-11-14 DIAGNOSIS — R931 Abnormal findings on diagnostic imaging of heart and coronary circulation: Secondary | ICD-10-CM | POA: Diagnosis not present

## 2023-11-14 DIAGNOSIS — J9811 Atelectasis: Secondary | ICD-10-CM | POA: Diagnosis not present

## 2023-12-05 ENCOUNTER — Encounter: Payer: Self-pay | Admitting: Nurse Practitioner

## 2023-12-05 ENCOUNTER — Ambulatory Visit (INDEPENDENT_AMBULATORY_CARE_PROVIDER_SITE_OTHER): Payer: Medicare HMO | Admitting: Nurse Practitioner

## 2023-12-05 VITALS — BP 114/80 | HR 67 | Ht 64.5 in | Wt 133.0 lb

## 2023-12-05 DIAGNOSIS — C50411 Malignant neoplasm of upper-outer quadrant of right female breast: Secondary | ICD-10-CM

## 2023-12-05 DIAGNOSIS — Z9189 Other specified personal risk factors, not elsewhere classified: Secondary | ICD-10-CM | POA: Diagnosis not present

## 2023-12-05 DIAGNOSIS — Z1502 Genetic susceptibility to malignant neoplasm of ovary: Secondary | ICD-10-CM

## 2023-12-05 DIAGNOSIS — Z01419 Encounter for gynecological examination (general) (routine) without abnormal findings: Secondary | ICD-10-CM

## 2023-12-05 DIAGNOSIS — M8589 Other specified disorders of bone density and structure, multiple sites: Secondary | ICD-10-CM

## 2023-12-05 DIAGNOSIS — Z853 Personal history of malignant neoplasm of breast: Secondary | ICD-10-CM

## 2023-12-05 NOTE — Progress Notes (Signed)
 Tina Nielsen June 13, 1951 [id number]   History:  73 y.o. G2P0002 presents for breast and pelvic exam. No GYN complaints. Normal pap history. Postmenopausal - no HRT, no bleeding. 2017 right breast cancer ER/PR + HER-2 negative CHEK2 + managed with lumpectomy, radiation, chemo, and tamoxifen .   Gynecologic History No LMP recorded. Patient is postmenopausal.   Contraception/Family planning: post menopausal status Sexually active: No  Health Maintenance Last Pap: 11/23/2021. Results were: Normal neg HPV Last breast MRI: 08/28/2023. Results were: Normal Last colonoscopy: 2021. Results were: Benign polyp, 5-year recall Last Dexa: 05/2023. Results were: osteopenia per patient  Past medical history, past surgical history, family history and social history were all reviewed and documented in the EPIC chart. 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 3 grandsons ages 75 and younger. Mother had breast cancer in her 69s, 17s, and 21s.   ROS:  A ROS was performed and pertinent positives and negatives are included.  Exam:  Vitals:   12/05/23 1008  BP: 114/80  Pulse: 67  SpO2: 98%  Weight: 133 lb (60.3 kg)  Height: 5' 4.5" (1.638 m)    Body mass index is 22.48 kg/m.  General appearance:  Normal Thyroid:  Symmetrical, normal in size, without palpable masses or nodularity. Respiratory  Auscultation:  Clear without wheezing or rhonchi Cardiovascular  Auscultation:  Regular rate, without rubs, murmurs or gallops  Edema/varicosities:  Not grossly evident Abdominal  Soft,nontender, without masses, guarding or rebound.  Liver/spleen:  No organomegaly noted  Hernia:  None appreciated  Skin  Inspection:  Grossly normal Breasts: Examined lying and sitting.   Right: Without masses, retractions, discharge or axillary adenopathy.   Left: Without masses, retractions, discharge or axillary adenopathy. Pelvic: External genitalia:  no lesions              Urethra:  normal appearing urethra  with no masses, tenderness or lesions              Bartholins and Skenes: normal                 Vagina: normal appearing vagina with normal color and discharge, no lesions. Atrophic changes              Cervix: no lesions Bimanual Exam:  Uterus:  no masses or tenderness              Adnexa: no mass, fullness, tenderness              Rectovaginal: Deferred              Anus:  normal, no lesions  Patient informed chaperone available to be present for breast and pelvic exam. Patient has requested no chaperone to be present. Patient has been advised what will be completed during breast and pelvic exam.   Assessment/Plan:  73 y.o. G2P0002 for breast and pelvic exam.   Encounter for breast and pelvic examination - Education provided on SBEs, importance of preventative screenings, current guidelines, high calcium diet, regular exercise, and multivitamin daily. Labs with PCP.   History of right breast cancer - 2017 right breast cancer ER/PR + HER-2 negative CHEK-2 + managed with lumpectomy, radiation, chemo, and tamoxifen . Mammogram and breast MRI annually.   Osteopenia of multiple sites - 2024 DXA. Managed by PCP. Continue daily vitamin D supplement and regular exercise.   Screening for cervical cancer - Normal Pap history.  No longer screening per guidelines.   Screening for colon cancer - 2021 colonoscopy.  Will  repeat at 5-year interval per GI's recommendation.   Return in about 2 years (around 12/04/2025) for B&P.    Andee Bamberger DNP, 10:38 AM 12/05/2023

## 2024-01-07 ENCOUNTER — Encounter: Payer: Self-pay | Admitting: Hematology and Oncology

## 2024-01-07 ENCOUNTER — Other Ambulatory Visit: Payer: Self-pay | Admitting: *Deleted

## 2024-01-07 DIAGNOSIS — Z170 Estrogen receptor positive status [ER+]: Secondary | ICD-10-CM

## 2024-01-07 DIAGNOSIS — C50919 Malignant neoplasm of unspecified site of unspecified female breast: Secondary | ICD-10-CM

## 2024-01-09 DIAGNOSIS — F4320 Adjustment disorder, unspecified: Secondary | ICD-10-CM | POA: Diagnosis not present

## 2024-01-14 DIAGNOSIS — E785 Hyperlipidemia, unspecified: Secondary | ICD-10-CM | POA: Diagnosis not present

## 2024-02-01 DIAGNOSIS — M170 Bilateral primary osteoarthritis of knee: Secondary | ICD-10-CM | POA: Diagnosis not present

## 2024-02-13 DIAGNOSIS — Z1231 Encounter for screening mammogram for malignant neoplasm of breast: Secondary | ICD-10-CM | POA: Diagnosis not present

## 2024-02-22 ENCOUNTER — Encounter: Payer: Self-pay | Admitting: Hematology and Oncology

## 2024-04-24 DIAGNOSIS — E785 Hyperlipidemia, unspecified: Secondary | ICD-10-CM | POA: Diagnosis not present

## 2024-04-24 DIAGNOSIS — R5382 Chronic fatigue, unspecified: Secondary | ICD-10-CM | POA: Diagnosis not present

## 2024-04-29 DIAGNOSIS — E785 Hyperlipidemia, unspecified: Secondary | ICD-10-CM | POA: Diagnosis not present

## 2024-04-29 DIAGNOSIS — J9811 Atelectasis: Secondary | ICD-10-CM | POA: Diagnosis not present

## 2024-04-29 DIAGNOSIS — D0510 Intraductal carcinoma in situ of unspecified breast: Secondary | ICD-10-CM | POA: Diagnosis not present

## 2024-04-29 DIAGNOSIS — R931 Abnormal findings on diagnostic imaging of heart and coronary circulation: Secondary | ICD-10-CM | POA: Diagnosis not present

## 2024-04-29 DIAGNOSIS — M436 Torticollis: Secondary | ICD-10-CM | POA: Diagnosis not present

## 2024-04-29 DIAGNOSIS — I700 Atherosclerosis of aorta: Secondary | ICD-10-CM | POA: Diagnosis not present

## 2024-04-29 DIAGNOSIS — F3342 Major depressive disorder, recurrent, in full remission: Secondary | ICD-10-CM | POA: Diagnosis not present

## 2024-04-29 DIAGNOSIS — L989 Disorder of the skin and subcutaneous tissue, unspecified: Secondary | ICD-10-CM | POA: Diagnosis not present

## 2024-04-29 DIAGNOSIS — Z0000 Encounter for general adult medical examination without abnormal findings: Secondary | ICD-10-CM | POA: Diagnosis not present

## 2024-05-13 DIAGNOSIS — M170 Bilateral primary osteoarthritis of knee: Secondary | ICD-10-CM | POA: Diagnosis not present

## 2024-05-16 ENCOUNTER — Inpatient Hospital Stay: Payer: Medicare HMO | Attending: Hematology and Oncology | Admitting: Hematology and Oncology

## 2024-05-16 VITALS — BP 110/74 | HR 59 | Temp 98.0°F | Resp 18 | Ht 65.0 in | Wt 133.3 lb

## 2024-05-16 DIAGNOSIS — Z1589 Genetic susceptibility to other disease: Secondary | ICD-10-CM

## 2024-05-16 DIAGNOSIS — E7800 Pure hypercholesterolemia, unspecified: Secondary | ICD-10-CM | POA: Insufficient documentation

## 2024-05-16 DIAGNOSIS — C50919 Malignant neoplasm of unspecified site of unspecified female breast: Secondary | ICD-10-CM | POA: Diagnosis not present

## 2024-05-16 DIAGNOSIS — Z170 Estrogen receptor positive status [ER+]: Secondary | ICD-10-CM | POA: Diagnosis not present

## 2024-05-16 DIAGNOSIS — Z923 Personal history of irradiation: Secondary | ICD-10-CM | POA: Diagnosis not present

## 2024-05-16 DIAGNOSIS — Z853 Personal history of malignant neoplasm of breast: Secondary | ICD-10-CM | POA: Insufficient documentation

## 2024-05-16 DIAGNOSIS — Z1509 Genetic susceptibility to other malignant neoplasm: Secondary | ICD-10-CM

## 2024-05-16 DIAGNOSIS — Z1501 Genetic susceptibility to malignant neoplasm of breast: Secondary | ICD-10-CM | POA: Diagnosis not present

## 2024-05-16 DIAGNOSIS — C50411 Malignant neoplasm of upper-outer quadrant of right female breast: Secondary | ICD-10-CM

## 2024-05-16 NOTE — Progress Notes (Signed)
 [facility]  [phone] [phone]) [phone]    ID: CADINCE HILSCHER DOB: [birth year]  MR#: [id number]  RDW#:[id number]  Patient Care Team: Dyane Anthony RAMAN, FNP as PCP - General (Family Medicine) Ebbie Cough, MD as Consulting Physician (General Surgery) Keenan Hastings, MD as Consulting Physician (Radiation Oncology) Fontaine, Evalene SQUIBB, MD (Inactive) as Consulting Physician (Gynecology) Neysa Inocente PARAS, NP (Inactive) as Nurse Practitioner (Obstetrics and Gynecology)   CHIEF COMPLAINT: Estrogen receptor positive breast cancer  CURRENT TREATMENT: Completed antiestrogen therapy   INTERVAL HISTORY:  The patient, with a history of breast cancer and a CHEK2 mutation, presents for a routine follow-up.   Discussed the use of AI scribe software for clinical note transcription with the patient, who gave verbal consent to proceed.  History of Present Illness Tina Nielsen is a 73 year old female who presents for a routine follow-up.  She is on atorvastatin 80 mg and recently started Zetia due to persistent elevated LDL cholesterol over 100. Her mother had very high cholesterol.  She experiences sciatic pain radiating down her legs, associated with osteoarthritis in both knees. She received knee injections this week, which have improved her symptoms. She was off for three weeks due to the pain.  She had a normal mammogram in July and alternates with MRIs in January. She is no longer on diagnostic mammogram as she is past five years from her first breast cancer diagnosis and is not a D density. She finds breast self-exams challenging due to difficulty detecting small lumps. No new findings in breast self-exams. Her nipples have always been inverted, complicating breastfeeding.  She notes slight weight loss due to decreased appetite and experiences swelling during exercise.     COVID 19 VACCINATION STATUS: Pfizer x4, also had COVID February 2022 and  2   BREAST CANCER HISTORY:  From the original intake note:  [name] noted a possible lump in her left breast and was referred for diagnostic mammography with tomosynthesis at [facility] 08/17/2015. The breast composition was category D. There was no finding in the left breast to correlate with the patient's lower inner quadrant concern. Ultrasound was obtained the same day and was likewise unremarkable.   However in the right breast upper outer quadrant posteriorly there was a 4 mm area of grouped calcifications. This was felt to warrant biopsy, which was performed stereotactically 09/01/2015. The final pathology ([id number]) showed an invasive ductal carcinoma, grade 1, estrogen receptor 100% positive, progesterone receptor 90% positive, both with strong staining intensity, with an MIB-1 of 10%, and no HER-2 amplification, the signals ratio being 1.17 and the number per cell 2.35.  Bilateral breast MRI was obtained 09/06/2015. This describes the breast density as category C. In the upper outer quadrant of the right breast there was a clip. There was no suspicious surrounding enhancement. More laterally there was a 5.1 cm fluid collection compatible with a hematoma. The left breast was normal and there was no abnormal appearing adenopathy.   Her subsequent history is as detailed below.   PAST MEDICAL HISTORY: Past Medical History:  Diagnosis Date   Anxiety    Arthritis    knees   Atrophic vaginitis    Breast cancer of upper-outer quadrant of right female breast (HCC) 09/03/2015   Family history of breast cancer    High cholesterol    Osteopenia     PAST SURGICAL HISTORY: Past Surgical History:  Procedure Laterality Date   BREAST LUMPECTOMY WITH NEEDLE LOCALIZATION AND AXILLARY SENTINEL  LYMPH NODE BX Right 09/30/2015   Procedure: RIGHT BREAST LUMPECTOMY WITH NEEDLE LOCALIZATION AND AXILLARY SENTINEL LYMPH NODE BX;  Surgeon: Donnice Bury, MD;  Location: [facility];   Service: General;  Laterality: Right;   BREAST SURGERY     BIOPSY   cataract surg Right    KNEE SURGERY     TUBAL LIGATION      FAMILY HISTORY Family History  Problem Relation Age of Onset   Hypertension Mother    Breast cancer Mother 104       second cancer at 5   Heart disease Mother    Aneurysm Father        AORTIC   Kidney cancer Maternal Aunt 40   Breast cancer Paternal Aunt    Prostate cancer Paternal Uncle 38   Stroke Maternal Grandmother    Heart disease Maternal Grandfather    Breast cancer Other        MGMs sister   Breast cancer Other        PGMs sister   Breast cancer Other        PGF sister   Colon cancer Neg Hx   The patient's father died from a ruptured aortic aneurysm the age of 68. The patient's mother was diagnosed with breast cancer at age 67. She had a second breast cancer diagnosed at age 10.She died at age 93. The patient had one brother, no sisters. As far as she is aware that there are no other breast or ovarian cancer instances in the family   GYNECOLOGIC HISTORY:  No LMP recorded. Patient is postmenopausal. Menarche age 22, first live birth age 38, which the patient knows increases the risk of breast cancer. She is GX P2. She stopped having periods around age 30. She did not take hormone replacement. 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 Her husband [name] taught history as well as coached basketball at the same school. He is now retired but is very active in the [employer]  basketball coaches [employer]. He knows [place] P[place] well and has quite a few stories to tell regarding [facility] basketball. Their son Ozell lives in [place]  and works in Scientist, clinical (histocompatibility and immunogenetics) as a Geneticist, molecular. Son Lynwood lives in [place]  working as a Best boy. The children have been made aware of the CHEK2 mutation and are considering testing.The patient has two  grandchildren.    ADVANCED DIRECTIVES: In place   HEALTH MAINTENANCE: Social History   Tobacco Use   Smoking status: Former    Current packs/day: 0.00    Types: Cigarettes    Quit date: 08/07/1976    Years since quitting: 47.8   Smokeless tobacco: Never  Vaping Use   Vaping status: Never Used  Substance Use Topics   Alcohol use: Yes    Alcohol/week: 7.0 standard drinks of alcohol    Types: 7 Glasses of wine per week   Drug use: No     Colonoscopy: 2014/[facility]  PAP:  Bone density: June 2018, -2.0 osteopenia  Lipid panel:  Allergies  Allergen Reactions   Aleve [Naproxen] Other (See Comments)    stomach irritation   Celebrex [Celecoxib]     Irritated stomach- caused burning   Ibuprofen     UPSET STOMACH- burning    Current Outpatient Medications  Medication Sig Dispense Refill   atorvastatin (LIPITOR) 80 MG tablet Take 80 mg by mouth daily.  Calcium Carb-Cholecalciferol (CALCIUM HIGH POTENCY/VITAMIN D) 600-5 MG-MCG TABS 1 tablet with food     Calcium Carbonate-Vitamin D (CALCIUM + D PO) Take by mouth.     Cholecalciferol (VITAMIN D-3 PO) Take by mouth.     escitalopram  (LEXAPRO ) 10 MG tablet Take 1 tablet by mouth daily.     ezetimibe (ZETIA) 10 MG tablet Take 10 mg by mouth daily.     Multiple Vitamins-Minerals (ONE A DAY WOMEN 50 PLUS PO) Take by mouth.     No current facility-administered medications for this visit.    OBJECTIVE: White woman in no acute distress There were no vitals filed for this visit.     There is no height or weight on file to calculate BMI.    ECOG FS:1 - Symptomatic but completely ambulatory  Physical Exam Constitutional:      Appearance: Normal appearance.  Chest:     Comments: Bilateral breasts inspected and palpated.  No palpable masses.  No regional adenopathy. Musculoskeletal:     Cervical back: Normal range of motion.  Lymphadenopathy:     Cervical: No cervical adenopathy.  Neurological:     Mental Status: She is  alert.     LAB RESULTS:  CMP     Component Value Date/Time   NA 142 05/03/2021 1122   NA 140 12/07/2016 1504   K 3.8 05/03/2021 1122   K 3.8 12/07/2016 1504   CL 104 05/03/2021 1122   CO2 28 05/03/2021 1122   CO2 28 12/07/2016 1504   GLUCOSE 82 05/03/2021 1122   GLUCOSE 99 12/07/2016 1504   BUN 16 05/03/2021 1122   BUN 16.3 12/07/2016 1504   CREATININE 1.00 05/03/2021 1122   CREATININE 0.8 12/07/2016 1504   CALCIUM 9.2 05/03/2021 1122   CALCIUM 8.9 12/07/2016 1504   PROT 6.6 05/03/2021 1122   PROT 6.5 12/07/2016 1504   ALBUMIN 3.8 05/03/2021 1122   ALBUMIN 3.6 12/07/2016 1504   AST 23 05/03/2021 1122   AST 21 12/07/2016 1504   ALT 17 05/03/2021 1122   ALT 18 12/07/2016 1504   ALKPHOS 55 05/03/2021 1122   ALKPHOS 50 12/07/2016 1504   BILITOT 0.7 05/03/2021 1122   BILITOT 0.61 12/07/2016 1504   GFRNONAA >60 05/03/2021 1122   GFRAA >60 04/30/2020 1412    INo results found for: SPEP, UPEP  Lab Results  Component Value Date   WBC 5.5 05/03/2021   NEUTROABS 3.4 05/03/2021   HGB 12.5 05/03/2021   HCT 37.3 05/03/2021   MCV 96.1 05/03/2021   PLT 185 05/03/2021      Chemistry      Component Value Date/Time   NA 142 05/03/2021 1122   NA 140 12/07/2016 1504   K 3.8 05/03/2021 1122   K 3.8 12/07/2016 1504   CL 104 05/03/2021 1122   CO2 28 05/03/2021 1122   CO2 28 12/07/2016 1504   BUN 16 05/03/2021 1122   BUN 16.3 12/07/2016 1504   CREATININE 1.00 05/03/2021 1122   CREATININE 0.8 12/07/2016 1504      Component Value Date/Time   CALCIUM 9.2 05/03/2021 1122   CALCIUM 8.9 12/07/2016 1504   ALKPHOS 55 05/03/2021 1122   ALKPHOS 50 12/07/2016 1504   AST 23 05/03/2021 1122   AST 21 12/07/2016 1504   ALT 17 05/03/2021 1122   ALT 18 12/07/2016 1504   BILITOT 0.7 05/03/2021 1122   BILITOT 0.61 12/07/2016 1504       No results found for: LABCA2  No components  found for: [id number]  No results for input(s): INR in the last 168  hours.  Urinalysis    Component Value Date/Time   COLORURINE YELLOW 10/28/2019 1604   APPEARANCEUR CLEAR 10/28/2019 1604   LABSPEC 1.021 10/28/2019 1604   PHURINE 6.5 10/28/2019 1604   GLUCOSEU NEGATIVE 10/28/2019 1604   HGBUR NEGATIVE 10/28/2019 1604   BILIRUBINUR NEGATIVE 01/23/2017 1628   KETONESUR TRACE (A) 10/28/2019 1604   PROTEINUR NEGATIVE 10/28/2019 1604   UROBILINOGEN 0.2 02/10/2014 0846   NITRITE NEGATIVE 01/23/2017 1628   LEUKOCYTESUR NEGATIVE 01/23/2017 1628    ELIGIBLE FOR AVAILABLE RESEARCH PROTOCOL: no   STUDIES: No results found.   ASSESSMENT: 73 y.o. [place] woman status post right breast upper outer quadrant biopsy 09/01/2015 for a clinical T1a N0, stage IA invasive ductal carcinoma, grade 1, estrogen and progesterone receptor positive, HER-2 not amplified, with an MIB-1 of 10%  (1) genetics testing 10/19/2015 through the Breast/Ovarian gene panel offered by [facility] found no deleterious mutations in  ATM, BARD1, BRCA1, BRCA2, BRIP1, CDH1, EPCAM, FANCC, MLH1, MSH2, MSH6, NBN, PALB2, PMS2, PTEN, RAD51C, RAD51D, TP53, and XRCC2.  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   (b) Genetic testing also detected a Variant of Unknown Significance in the BRCA2 gene called [id number].  (c) breast cancer intensified screening will consist of breast MRI in July and breast mammography in January yearly  (d) colon cancer screening will consist of colonoscopy every 5 years or more often depending on findings   (i) most recent colonoscopy 07/28/2013; no polyps noted  (2) status post right lumpectomy with sentinel lymph node sampling 09/30/2015 for a pTis pN0, stage 0 ductal carcinoma in situ, grade 2, with negative margins  (3) adjuvant radiation (mammosite) completed May 2017  (4) started tamoxifen  01/06/2016, switched to anastrozole  starting 12/06/2018  (a) endometrial biopsy 10/11/2016 shows scant tissue with an  atrophic pattern.  (b) switched from anastrozole  to exemestane  August 2020 due to side effects  (c) discontinued exemestane  October 2020 due to side effects  (d) resumed tamoxifen  now at 10 mg daily beginning 07/01/2019, completed 5 yrs  (5) bone density at [facility] 01/22/2017 shows a T score of -2.0  (a) repeat bone density 05/16/2019 at [facility] shows a T score of -2.0 (stable)    PLAN:  Patient is here for follow-up.   Assessment and Plan Assessment & Plan History of Breast cancer with dense breasts Recent mammogram normal. MRI to be scheduled for January. No new findings. Chronic nipple inversion. Breast density decreased to C. No concerning findings. - Order MRI for January at [facility] - Order screening mammogram for next July. - Instruct to perform monthly breast self-exams. - Recommend breast exam by PCP or GYN in six months.  Hypercholesterolemia Managed with atorvastatin and Zetia. LDL previously over 100. Family history noted. She is working with her PCP to have this better controlled.     Total time spent: 20 min  *Total Encounter Time as defined by the Centers for Medicare and Medicaid Services includes, in addition to the face-to-face time of a patient visit (documented in the note above) non-face-to-face time: obtaining and reviewing outside history, ordering and reviewing medications, tests or procedures, care coordination (communications with other health care professionals or caregivers) and documentation in the medical record.

## 2024-06-12 DIAGNOSIS — H5211 Myopia, right eye: Secondary | ICD-10-CM | POA: Diagnosis not present

## 2024-07-15 ENCOUNTER — Encounter (HOSPITAL_BASED_OUTPATIENT_CLINIC_OR_DEPARTMENT_OTHER): Payer: Self-pay | Admitting: Emergency Medicine

## 2024-07-15 ENCOUNTER — Other Ambulatory Visit: Payer: Self-pay

## 2024-07-15 ENCOUNTER — Emergency Department (HOSPITAL_BASED_OUTPATIENT_CLINIC_OR_DEPARTMENT_OTHER)

## 2024-07-15 ENCOUNTER — Emergency Department (HOSPITAL_BASED_OUTPATIENT_CLINIC_OR_DEPARTMENT_OTHER)
Admission: EM | Admit: 2024-07-15 | Discharge: 2024-07-15 | Disposition: A | Discharge status: Home / Self Care | Attending: Emergency Medicine | Admitting: Emergency Medicine

## 2024-07-15 DIAGNOSIS — N9489 Other specified conditions associated with female genital organs and menstrual cycle: Secondary | ICD-10-CM

## 2024-07-15 DIAGNOSIS — K7689 Other specified diseases of liver: Secondary | ICD-10-CM | POA: Diagnosis not present

## 2024-07-15 DIAGNOSIS — R1031 Right lower quadrant pain: Secondary | ICD-10-CM | POA: Diagnosis not present

## 2024-07-15 LAB — URINALYSIS, ROUTINE W REFLEX MICROSCOPIC
Bacteria, UA: NONE SEEN
Bilirubin Urine: NEGATIVE
Glucose, UA: NEGATIVE mg/dL
Hgb urine dipstick: NEGATIVE
Ketones, ur: 15 mg/dL — AB
Nitrite: NEGATIVE
Protein, ur: 30 mg/dL — AB
Specific Gravity, Urine: 1.031 — ABNORMAL HIGH (ref 1.005–1.030)
pH: 5.5 (ref 5.0–8.0)

## 2024-07-15 LAB — COMPREHENSIVE METABOLIC PANEL WITH GFR
ALT: 41 U/L (ref 0–44)
AST: 36 U/L (ref 15–41)
Albumin: 4.5 g/dL (ref 3.5–5.0)
Alkaline Phosphatase: 94 U/L (ref 38–126)
Anion gap: 12 (ref 5–15)
BUN: 17 mg/dL (ref 8–23)
CO2: 29 mmol/L (ref 22–32)
Calcium: 9.6 mg/dL (ref 8.9–10.3)
Chloride: 100 mmol/L (ref 98–111)
Creatinine, Ser: 0.77 mg/dL (ref 0.44–1.00)
GFR, Estimated: >60 mL/min (ref >60)
Glucose, Bld: 116 mg/dL — ABNORMAL HIGH (ref 70–99)
Potassium: 3.7 mmol/L (ref 3.5–5.1)
Sodium: 140 mmol/L (ref 135–145)
Total Bilirubin: 1.2 mg/dL (ref 0.0–1.2)
Total Protein: 6.9 g/dL (ref 6.5–8.1)

## 2024-07-15 LAB — CBC
HCT: 42.4 % (ref 36.0–46.0)
Hemoglobin: 14.1 g/dL (ref 12.0–15.0)
MCH: 31.9 pg (ref 26.0–34.0)
MCHC: 33.3 g/dL (ref 30.0–36.0)
MCV: 95.9 fL (ref 80.0–100.0)
Platelets: 209 K/uL (ref 150–400)
RBC: 4.42 MIL/uL (ref 3.87–5.11)
RDW: 12.5 % (ref 11.5–15.5)
WBC: 6.9 K/uL (ref 4.0–10.5)
nRBC: 0 % (ref 0.0–0.2)

## 2024-07-15 LAB — LIPASE, BLOOD: Lipase: 13 U/L (ref 11–51)

## 2024-07-15 MED ORDER — LACTATED RINGERS IV BOLUS
500.0000 mL | Freq: Once | INTRAVENOUS | Status: AC
  Administered 2024-07-15: 500 mL via INTRAVENOUS

## 2024-07-15 MED ORDER — IOHEXOL 300 MG/ML  SOLN
100.0000 mL | Freq: Once | INTRAMUSCULAR | Status: AC | PRN
  Administered 2024-07-15: 100 mL via INTRAVENOUS

## 2024-07-15 MED ORDER — MORPHINE SULFATE (PF) 2 MG/ML IV SOLN
2.0000 mg | Freq: Once | INTRAVENOUS | Status: AC
  Administered 2024-07-15: 2 mg via INTRAVENOUS
  Filled 2024-07-15: qty 1

## 2024-07-15 MED ORDER — DICYCLOMINE HCL 10 MG PO CAPS
10.0000 mg | ORAL_CAPSULE | Freq: Once | ORAL | Status: AC
  Administered 2024-07-15: 10 mg via ORAL
  Filled 2024-07-15: qty 1

## 2024-07-15 MED ORDER — DICYCLOMINE HCL 20 MG PO TABS: 10.0000 mg | ORAL_TABLET | Freq: Every day | ORAL | 0 refills | Status: DC | PRN

## 2024-07-15 NOTE — ED Provider Notes (Incomplete)
 [place] EMERGENCY DEPARTMENT AT [facility] Provider Note   CSN: [id number] Arrival date & time: 07/15/24  1601     History Chief Complaint  Patient presents with  . Abdominal Pain    HPI: Tina Nielsen is a 73 y.o. female with history pertinent for osteopenia, breast cancer in remission who presents complaining of right lower quadrant abdominal pain. Patient arrived via POV accompanied by husband, Tina Nielsen.  History provided by patient.  No interpreter required during this encounter.  Patient reports that she woke up this morning and did have some vague stomach discomfort, was not particularly in any part of her stomach.  Reports that that she had a nurse from her insurance, and examined her, who told her that it was probably a tummy bug, patient was able to tolerate cereal for breakfast, however throughout the day she has had worsening of her abdominal pain, and localization with pain to the right lower quadrant.  Reports that the pain began intensifying at approximately 1 PM, since then she has had 3 episodes of emesis.  Has had 3 normal bowel movements today.  Denies fever, chills, chest pain, shortness of breath, endorses nausea.  Reports that she has been in menopause for approximately 20 years, denies any recent vaginal bleeding or spotting, denies family history of ovarian cancer.  Reports that only prior abdominal surgery was remote tubal ligation.  Denies dysuria.  Patient's recorded medical, surgical, social, medication list and allergies were reviewed in the Snapshot window as part of the initial history.   Prior to Admission medications   Medication Sig Start Date End Date Taking? Authorizing Provider  dicyclomine  (BENTYL ) 20 MG tablet Take 0.5 tablets (10 mg total) by mouth daily as needed for spasms. 07/15/24  Yes Rogelia Jerilynn RAMAN, MD  atorvastatin (LIPITOR) 80 MG tablet Take 80 mg by mouth daily.    [provider]  Calcium Carb-Cholecalciferol  (CALCIUM HIGH POTENCY/VITAMIN D) 600-5 MG-MCG TABS 1 tablet with food 06/29/21   [provider]  Calcium Carbonate-Vitamin D (CALCIUM + D PO) Take by mouth.    [provider]  Cholecalciferol (VITAMIN D-3 PO) Take by mouth.    [provider]  escitalopram  (LEXAPRO ) 10 MG tablet Take 1 tablet by mouth daily.    [provider]  Multiple Vitamins-Minerals (ONE A DAY WOMEN 50 PLUS PO) Take by mouth.    [provider]     Allergies: Aleve [naproxen], Celebrex [celecoxib], and Ibuprofen   Review of Systems   ROS as per HPI  Physical Exam Updated Vital Signs BP (!) 146/65 (BP Location: Right Arm)   Pulse 60   Temp 98.4 F (36.9 C) (Oral)   Resp 18   SpO2 98%  Physical Exam Vitals and nursing note reviewed.  Constitutional:      General: She is not in acute distress.    Appearance: She is well-developed.  HENT:     Head: Normocephalic and atraumatic.  Eyes:     Conjunctiva/sclera: Conjunctivae normal.  Cardiovascular:     Rate and Rhythm: Normal rate and regular rhythm.     Heart sounds: No murmur heard. Pulmonary:     Effort: Pulmonary effort is normal. No respiratory distress.     Breath sounds: Normal breath sounds.  Abdominal:     Palpations: Abdomen is soft.     Tenderness: There is abdominal tenderness in the right lower quadrant. There is no guarding or rebound. Positive signs include McBurney's sign. Negative signs include Murphy's  sign.  Musculoskeletal:        General: No swelling.     Cervical back: Neck supple.  Skin:    General: Skin is warm and dry.     Capillary Refill: Capillary refill takes less than 2 seconds.  Neurological:     Mental Status: She is alert.  Psychiatric:        Mood and Affect: Mood normal.     ED Course/ Medical Decision Making/ A&P    Procedures Procedures   Medications Ordered in ED Medications  lactated ringers  bolus 500 mL (0 mLs Intravenous Stopped 07/15/24 1803)  morphine   (PF) 2 MG/ML injection 2 mg (2 mg Intravenous Given 07/15/24 1700)  iohexol  (OMNIPAQUE ) 300 MG/ML solution 100 mL (100 mLs Intravenous Contrast Given 07/15/24 1726)  dicyclomine  (BENTYL ) capsule 10 mg (10 mg Oral Given 07/15/24 1835)    Medical Decision Making:   Tina Nielsen is a 73 y.o. female who presents for abdominal pain as per above.  Physical exam is pertinent for moderate right lower quadrant abdominal tenderness without guarding or rebound.   The differential includes but is not limited to appendicitis, pancreatitis, ovarian pathology, ovarian torsion, malignancy, ileitis, bowel obstruction, cystitis, nephrolithiasis.  Independent historian: None  External data reviewed: No pertinent external data  Initial Plan:  Screening labs including CBC and Metabolic panel to evaluate for infectious or metabolic etiology of disease.  Screening lipase for pancreatitis Urinalysis with reflex culture ordered to evaluate for UTI or relevant urologic/nephrologic pathology.  CT abdomen and pelvis to evaluate for structural/infectious intra-abdominal pathology.  Fluid resuscitation given patient reports vomiting, 2 mg morphine  for abdominal pain Objective evaluation as below reviewed   Labs: Ordered, Independent interpretation, and Details: CBC without leukocytosis, anemia, thrombocytopenia.  CMP without AKI, emergent electrolyte derangement, emergent LFT abnormality.  Lipase WNL.  UA with mild ketonuria, no evidence of UTI.  Radiology: Ordered, Independent interpretation, Details: Personally viewed patient CT of the abdomen and pelvis, do not appreciate obstructive bowel gas pattern, focal fat stranding, mass lesion, free fluid, free air.  I do expect hyperenhancement of the endometrium of the uterus, which is unexpected given patient's postmenopausal state., and All images reviewed independently. ***Agree with radiology report at this time.   CT ABDOMEN PELVIS W CONTRAST Result Date:  07/15/2024 CLINICAL DATA:  Acute right lower quadrant pain. Nausea and vomiting. EXAM: CT ABDOMEN AND PELVIS WITH CONTRAST TECHNIQUE: Multidetector CT imaging of the abdomen and pelvis was performed using the standard protocol following bolus administration of intravenous contrast. RADIATION DOSE REDUCTION: This exam was performed according to the departmental dose-optimization program which includes automated exposure control, adjustment of the mA and/or kV according to patient size and/or use of iterative reconstruction technique. CONTRAST:  OMNIPAQUE  IOHEXOL  300 MG/ML  SOLN COMPARISON:  None Available. FINDINGS: Lower Chest: No acute findings. Hepatobiliary: Several tiny hepatic cysts are noted. No suspicious hepatic masses are identified. Gallbladder is unremarkable. No evidence of biliary ductal dilatation. Pancreas: Fatty replacement of pancreas noted. No mass or inflammatory changes. Spleen: Within normal limits in size and appearance. Adrenals/Urinary Tract: No suspicious masses identified. No evidence of ureteral calculi or hydronephrosis. Stomach/Bowel: No evidence of obstruction, inflammatory process or abnormal fluid collections. Although the appendix is not directly visualized, no inflammatory process seen in region of the cecum or elsewhere. Vascular/Lymphatic: No pathologically enlarged lymph nodes. No acute vascular findings. Reproductive: Uterus is unremarkable. A cystic lesion is seen in the right adnexa with with adjacent soft tissue density which may  represent a solid nodule. This measures 6.0 x 4.8 cm and is highly suspicious for cystic ovarian neoplasm. No evidence of peritoneal thickening, omental caking, or ascites. Other:  None. Musculoskeletal:  No suspicious bone lesions identified. IMPRESSION: 6 cm cystic lesion in right adnexa with possible mural soft tissue density, highly suspicious for cystic ovarian neoplasm. Recommend correlation with tumor markers, and consider further  characterization with pelvic MRI without and with contrast. No evidence of abdominal or pelvic metastatic disease. Electronically Signed   By: Norleen DELENA Kil M.D.   On: 07/15/2024 18:12    EKG/Medicine tests: Not indicated EKG Interpretation:    Interventions: LR bolus, morphine   See the EMR for full details regarding lab and imaging results.  Patient presents to the emergency department for abdominal pain, does have moderate tenderness to palpation.  Patient without prior abdominal surgeries, has remote history of menopause, no family history of ovarian pathology, therefore ovarian pathology is unlikely, thus we will first evaluate patient with CT of the abdomen and pelvis.  Lab and imaging workup as per initial plan above.  Will treat pain and mild dehydration with morphine  and fluids respectively.  {LSCOPA:33420}  Discussion of management or test interpretations with external provider(s): ***  Risk Drugs:{LSDRUGS:33399} Treatment: {LSTREATMENT:33409} Surgery:{LSSURGERY:33410} Critical Care: ***  Disposition: {LSDISPO:33388}  MDM generated using voice dictation software and may contain dictation errors.  Please contact me for any clarification or with any questions.  Clinical Impression:  1. Adnexal mass      Discharge   Final Clinical Impression(s) / ED Diagnoses Final diagnoses:  Adnexal mass    Rx / DC Orders ED Discharge Orders          Ordered    dicyclomine  (BENTYL ) 20 MG tablet  Daily PRN        07/15/24 2011    Ambulatory referral to Gynecologic Oncology        07/15/24 2017    Ambulatory referral to Hematology / Oncology        07/15/24 2017

## 2024-07-15 NOTE — ED Triage Notes (Signed)
 Right lower abdo pain , sharp pain Worse when standing Nausea and vomiting Started today

## 2024-07-15 NOTE — ED Provider Notes (Signed)
 [place] EMERGENCY DEPARTMENT AT [facility] Provider Note   CSN: [id number] Arrival date & time: 07/15/24  1601     History Chief Complaint  Patient presents with   Abdominal Pain    HPI: Tina Nielsen is a 73 y.o. female with history pertinent for osteopenia, breast cancer in remission who presents complaining of right lower quadrant abdominal pain. Patient arrived via POV accompanied by husband, Tina Nielsen.  History provided by patient.  No interpreter required during this encounter.  Patient reports that she woke up this morning and did have some vague stomach discomfort, was not particularly in any part of her stomach.  Reports that that she had a nurse from her insurance, and examined her, who told her that it was probably a tummy bug, patient was able to tolerate cereal for breakfast, however throughout the day she has had worsening of her abdominal pain, and localization with pain to the right lower quadrant.  Reports that the pain began intensifying at approximately 1 PM, since then she has had 3 episodes of emesis.  Has had 3 normal bowel movements today.  Denies fever, chills, chest pain, shortness of breath, endorses nausea.  Reports that she has been in menopause for approximately 20 years, denies any recent vaginal bleeding or spotting, denies family history of ovarian cancer.  Reports that only prior abdominal surgery was remote tubal ligation.  Denies dysuria.  Patient's recorded medical, surgical, social, medication list and allergies were reviewed in the Snapshot window as part of the initial history.   Prior to Admission medications   Medication Sig Start Date End Date Taking? Authorizing Provider  atorvastatin (LIPITOR) 80 MG tablet Take 80 mg by mouth daily.    [provider]  Calcium Carb-Cholecalciferol (CALCIUM HIGH POTENCY/VITAMIN D) 600-5 MG-MCG TABS 1 tablet with food 06/29/21   [provider]  Calcium Carbonate-Vitamin D (CALCIUM + D  PO) Take by mouth.    [provider]  Cholecalciferol (VITAMIN D-3 PO) Take by mouth.    [provider]  escitalopram  (LEXAPRO ) 10 MG tablet Take 1 tablet by mouth daily.    [provider]  Multiple Vitamins-Minerals (ONE A DAY WOMEN 50 PLUS PO) Take by mouth.    [provider]     Allergies: Aleve [naproxen], Celebrex [celecoxib], and Ibuprofen   Review of Systems   ROS as per HPI  Physical Exam Updated Vital Signs BP 125/63 (BP Location: Right Arm)   Pulse (!) 51   Temp 97.6 F (36.4 C) (Oral)   Resp 20   SpO2 99%  Physical Exam Vitals and nursing note reviewed.  Constitutional:      General: She is not in acute distress.    Appearance: She is well-developed.  HENT:     Head: Normocephalic and atraumatic.  Eyes:     Conjunctiva/sclera: Conjunctivae normal.  Cardiovascular:     Rate and Rhythm: Normal rate and regular rhythm.     Heart sounds: No murmur heard. Pulmonary:     Effort: Pulmonary effort is normal. No respiratory distress.     Breath sounds: Normal breath sounds.  Abdominal:     Palpations: Abdomen is soft.     Tenderness: There is abdominal tenderness in the right lower quadrant. There is no guarding or rebound. Positive signs include McBurney's sign. Negative signs include Murphy's sign.  Musculoskeletal:        General: No swelling.     Cervical back: Neck supple.  Skin:  General: Skin is warm and dry.     Capillary Refill: Capillary refill takes less than 2 seconds.  Neurological:     Mental Status: She is alert.  Psychiatric:        Mood and Affect: Mood normal.     ED Course/ Medical Decision Making/ A&P    Procedures Procedures   Medications Ordered in ED Medications - No data to display  Medical Decision Making:   Tina Nielsen is a 73 y.o. female who presents for abdominal pain as per above.  Physical exam is pertinent for moderate right lower quadrant abdominal tenderness without  guarding or rebound.   The differential includes but is not limited to appendicitis, pancreatitis, ovarian pathology, ovarian torsion, malignancy, ileitis, bowel obstruction, cystitis, nephrolithiasis.  Independent historian: None  External data reviewed: No pertinent external data  Initial Plan:  Screening labs including CBC and Metabolic panel to evaluate for infectious or metabolic etiology of disease.  Screening lipase for pancreatitis Urinalysis with reflex culture ordered to evaluate for UTI or relevant urologic/nephrologic pathology.  CT abdomen and pelvis to evaluate for structural/infectious intra-abdominal pathology.  Fluid resuscitation given patient reports vomiting, 2 mg morphine  for abdominal pain Objective evaluation as below reviewed   Labs: Ordered, Independent interpretation, and Details: CBC without leukocytosis, anemia, thrombocytopenia.  CMP without AKI, emergent electrolyte derangement, emergent LFT abnormality.  Lipase WNL.  UA with mild ketonuria, no evidence of UTI.  Radiology: Ordered, Independent interpretation, Details: Personally viewed patient CT of the abdomen and pelvis, do not appreciate obstructive bowel gas pattern, focal fat stranding, mass lesion, free fluid, free air.  I do expect hyperenhancement of the endometrium of the uterus, which is unexpected given patient's postmenopausal state., and All images reviewed independently. ***Agree with radiology report at this time.   No results found.  EKG/Medicine tests: Not indicated EKG Interpretation:    Interventions: LR bolus, morphine   See the EMR for full details regarding lab and imaging results.  Patient presents to the emergency department for abdominal pain, does have moderate tenderness to palpation.  Patient without prior abdominal surgeries, has remote history of menopause, no family history of ovarian pathology, therefore ovarian pathology is unlikely, thus we will first evaluate patient with CT  of the abdomen and pelvis.  Lab and imaging workup as per initial plan above.  Will treat pain and mild dehydration with morphine  and fluids respectively.  {LSCOPA:33420}  Discussion of management or test interpretations with external provider(s): ***  Risk Drugs:{LSDRUGS:33399} Treatment: {LSTREATMENT:33409} Surgery:{LSSURGERY:33410} Critical Care: ***  Disposition: {LSDISPO:33388}  MDM generated using voice dictation software and may contain dictation errors.  Please contact me for any clarification or with any questions.  Clinical Impression: No diagnosis found.   Data Unavailable   Final Clinical Impression(s) / ED Diagnoses Final diagnoses:  None    Rx / DC Orders ED Discharge Orders     None

## 2024-07-15 NOTE — Discharge Instructions (Addendum)
 Tina Nielsen  Thank you for allowing us  to take care of you today.  You came to the Emergency Department today because you having pain in the right lower part of your belly.  You did have improvement after getting pain medicine here in the emergency department.  We did blood work which was overall reassuring.  We are not seeing elevation of your kidney, liver, pancreas numbers, your blood counts are reassuring, and your urine does not show signs of infection.  Good news is that we did not find any evidence of severe inflammation or infection in your abdomen that requires hospitalization or surgery, however unfortunately we are seeing a mass in your right adnexa (next to your uterus on the right side) that is concerning for a possible ovarian cancer.  Please see the read from the radiology report below.  You should follow-up closely with your oncologist, primary care doctor, and we will also give you referral to a gynecologist who can perform next steps which will likely include additional imaging as well as additional blood tests.   IMPRESSION:  6 cm cystic lesion in right adnexa with possible mural soft tissue  density, highly suspicious for cystic ovarian neoplasm. Recommend  correlation with tumor markers, and consider further  characterization with pelvic MRI without and with contrast.    No evidence of abdominal or pelvic metastatic disease.         You can use Tylenol  and ibuprofen every 4-6 hours as needed for pain control.  We are also prescribing Bentyl , a antispasm medication that you can use for breakthrough pain.  Please be aware that this medication can be sleepy and increase your risk of falls particularly in older adults, thus you should take it with caution.   To-Do: 1. Please follow-up with your primary doctor within 7 days / as soon as possible.   Please return to the Emergency Department or call 911 if you experience have worsening of your symptoms, or do not get  better, chest pain, shortness of breath, severe or significantly worsening pain, high fever, severe confusion, pass out or have any reason to think that you need emergency medical care.   We hope you feel better soon.   Mitzie Later, MD Department of Emergency Medicine [facility]

## 2024-07-15 NOTE — ED Notes (Signed)
 DC paperwork given and verbally understood.

## 2024-07-17 ENCOUNTER — Telehealth: Payer: Self-pay | Admitting: *Deleted

## 2024-07-17 ENCOUNTER — Other Ambulatory Visit: Payer: Self-pay | Admitting: Hematology and Oncology

## 2024-07-17 ENCOUNTER — Telehealth: Payer: Self-pay | Admitting: Hematology and Oncology

## 2024-07-17 ENCOUNTER — Other Ambulatory Visit: Payer: Self-pay | Admitting: *Deleted

## 2024-07-17 DIAGNOSIS — N83201 Unspecified ovarian cyst, right side: Secondary | ICD-10-CM

## 2024-07-17 DIAGNOSIS — C50411 Malignant neoplasm of upper-outer quadrant of right female breast: Secondary | ICD-10-CM

## 2024-07-17 NOTE — Telephone Encounter (Signed)
 Patient aware of appointment scheduled for 08/23/2023 with MD to review scans per staff message.

## 2024-07-17 NOTE — Progress Notes (Signed)
 MRI abdomen and MR pelvis ordered to further investigate most recent abnormal CT imaging. We will see her after these MRI imaging.  Tina Nielsen

## 2024-07-17 NOTE — Telephone Encounter (Signed)
 Pt had recent CT that show mass on right ovary. Called pt to make aware that orders for imaging has been placed and referral has been sent to GYN ONC. Pt verbalized understanding. PA sent to PA dept and scheduling message sent to f/u for scheduling

## 2024-07-18 ENCOUNTER — Telehealth: Payer: Self-pay | Admitting: *Deleted

## 2024-07-18 NOTE — Telephone Encounter (Signed)
 Spoke with the patient regarding the referral to GYN oncology. Patient scheduled as new patient with Dr Viktoria on 12/19 at 12:30 pm. Patient given an arrival time of 12:15 pm.  Explained to the patient the the doctor will perform a pelvic exam at this visit. Patient given the policy that only one visitor allowed and that visitor must be over 16 yrs are allowed in the Cancer Center. Patient given the address/phone number for the clinic and that the center offers free valet service. Patient aware that masks optional.

## 2024-07-18 NOTE — Telephone Encounter (Signed)
 Per Dr. Loretha, called pt to make aware of MRI appts for 12/18. Advised to arrive at 0815 and NPO 4 hrs prior. Pt verbalized understanding

## 2024-07-22 ENCOUNTER — Other Ambulatory Visit (HOSPITAL_COMMUNITY): Payer: Self-pay | Admitting: Radiology

## 2024-07-24 ENCOUNTER — Ambulatory Visit (HOSPITAL_COMMUNITY): Admission: RE | Admit: 2024-07-24 | Discharge: 2024-07-24 | Attending: Hematology and Oncology

## 2024-07-24 ENCOUNTER — Ambulatory Visit (HOSPITAL_COMMUNITY)

## 2024-07-24 ENCOUNTER — Encounter (HOSPITAL_COMMUNITY): Payer: Self-pay

## 2024-07-24 ENCOUNTER — Ambulatory Visit: Payer: Self-pay | Admitting: Hematology and Oncology

## 2024-07-24 DIAGNOSIS — N83201 Unspecified ovarian cyst, right side: Secondary | ICD-10-CM | POA: Diagnosis not present

## 2024-07-24 DIAGNOSIS — N838 Other noninflammatory disorders of ovary, fallopian tube and broad ligament: Secondary | ICD-10-CM | POA: Diagnosis not present

## 2024-07-24 MED ORDER — GADOBUTROL 1 MMOL/ML IV SOLN
6.0000 mL | Freq: Once | INTRAVENOUS | Status: AC | PRN
  Administered 2024-07-24: 09:00:00 6 mL via INTRAVENOUS

## 2024-07-24 NOTE — Progress Notes (Unsigned)
 GYNECOLOGIC ONCOLOGY NEW PATIENT CONSULTATION   Patient Name: Tina Nielsen  Patient Age: 73 y.o. Date of Service: 07/25/2024 Referring Provider: Amber Stalls, MD  Primary Care Provider: Dyane Anthony RAMAN, FNP Consulting Provider: Comer Dollar, MD   Assessment/Plan:  Postmenopausal patient with simple appearing adnexal mass.  Discussed recent workup and possible etiologies for her acute pain.  Given the size of the adnexal mass, I suspect that she may have had ovarian torsion.  Reviewed that the ovary can sometimes detorsed and otherwise if it stays twisted, may slowly die over time.  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  We reviewed the risk of recurrent ovarian torsion and that this could cause similar pain in the future.  Ultimately, based on MRI images, my concern for borderline tumor or malignancy is quite low.  I offered the patient definitive surgery for diagnosis but also to help prevent future pain episodes versus surveillance.  Ultimately, she would like to move forward with surgery.  She would like to have least amount of surgery necessary.  Reviewed recommendation for bilateral salpingo-oophorectomy.  Plan to send the ovarian mass for frozen section.  If benign, no additional procedures would be indicated.  If borderline tumor, would proceed with staging including total hysterectomy, peritoneal biopsies, and omentectomy.  In the setting of an ovarian cancer, lymphadenectomy may also be indicated.  We reviewed the plan for a robotic assisted bilateral salpingo-oophorectomy, possible staging, possible laparotomy. The risks of surgery were discussed in detail and she understands these to include infection; wound separation; hernia; vaginal cuff separation, injury to adjacent organs such as bowel, bladder, blood vessels, ureters and nerves; bleeding which may require blood transfusion; anesthesia risk;  thromboembolic events; possible death; unforeseen complications; possible need for re-exploration; medical complications such as heart attack, stroke, pleural effusion and pneumonia. The patient will receive DVT and antibiotic prophylaxis as indicated. She voiced a clear understanding. She had the opportunity to ask questions. Perioperative instructions were reviewed with her. Prescriptions for post-op medications were sent to her pharmacy of choice.  A copy of this note was sent to the patient's referring provider.   60 minutes of total time was spent for this patient encounter, including preparation, face-to-face counseling with the patient and coordination of care, and documentation of the encounter.  Tina Dollar, MD  Division of Gynecologic Oncology  Department of Obstetrics and Gynecology  [facility]  ___________________________________________  Chief Complaint: No chief complaint on file.   History of Present Illness:  Tina Nielsen is a 73 y.o. y.o. female who is seen in consultation at the request of Dr. Stalls for an evaluation of an adnexal mass.  Patient presented to the emergency department on 12/9 with right lower quadrant pain that worsened over the course of the day and became associated with 3 episodes of emesis.  CT of the abdomen and pelvis was performed during that emergency department visit and notable for a 6 cm cystic lesion in the right adnexa with adjacent soft tissue density, which may represent solid nodule.  No peritoneal thickening, omental caking, ascites, or adenopathy. Given reassuring exam, plan was made for outpatient workup and treatment. MRI Pelvis on 07/24/2024: Uterus measures 8.3 cm, normal in morphology.  Nabothian cyst noted.  Exophytic off of the inferior right ovary is a simple cystic lesion measuring up to 5.7 cm.  No postcontrast enhancement or restricted diffusion.  No other concerning features.  Left ovary normal in  appearance.  Patient comes in with her husband today.  She describes having bellyache last Tuesday when she woke up that worsened over the day.  She began having right lower quadrant pain and then started throwing up.  Thought she might have appendicitis.  Went to the emergency department at [facility].  Pain slowly improved.  She was able to work the next day but was still having some pain.  She now endorses some mild right pelvic pain and pressure.  Denies any further nausea or emesis.  Endorses a good appetite.  Reports about 5 pounds of weight loss over the fall but thinks this may be due to to eating better.  Has some constipation at baseline, uses mineral oil as needed.  The patient's history is notable for breast cancer in the setting of a CHEK2 mutation.  She underwent lumpectomy and sentinel lymph node biopsy followed by adjuvant radiation.  She started tamoxifen  in 2017 and transition to anastrozole  in 2020.  Completed 5 years of treatment.  PAST MEDICAL HISTORY:  Past Medical History:  Diagnosis Date   Anxiety    Arthritis    knees   Atrophic vaginitis    Breast cancer of upper-outer quadrant of right female breast (HCC) 09/03/2015   Family history of breast cancer    High cholesterol    Osteopenia      PAST SURGICAL HISTORY:  Past Surgical History:  Procedure Laterality Date   BREAST LUMPECTOMY WITH NEEDLE LOCALIZATION AND AXILLARY SENTINEL LYMPH NODE BX Right 09/30/2015   Procedure: RIGHT BREAST LUMPECTOMY WITH NEEDLE LOCALIZATION AND AXILLARY SENTINEL LYMPH NODE BX;  Surgeon: Donnice Bury, MD;  Location: [facility];  Service: General;  Laterality: Right;   BREAST SURGERY     BIOPSY   cataract surg Right    KNEE SURGERY     TUBAL LIGATION      OB/GYN HISTORY:  OB History  Gravida Para Term Preterm AB Living  2 0   0 2  SAB IAB Ectopic Multiple Live Births  0        # Outcome Date GA Lbr Len/2nd Weight Sex Type Anes PTL Lv  2 Gravida            1 Gravida             No LMP recorded. Patient is postmenopausal.  Age at menarche: 4  Age at menopause: 42 Hx of HRT: denies Hx of STDs: denies Last pap: 2023 History of abnormal pap smears: denies  SCREENING STUDIES:  Last mammogram: 2025  Last colonoscopy: 2014  MEDICATIONS: Outpatient Encounter Medications as of 07/25/2024  Medication Sig   senna-docusate (SENOKOT-S) 8.6-50 MG tablet Take 2 tablets by mouth at bedtime. For AFTER surgery, do not take if having diarrhea   traMADol (ULTRAM) 50 MG tablet Take 1 tablet (50 mg total) by mouth every 6 (six) hours as needed for moderate pain (pain score 4-6). For AFTER surgery only, do not take and drive   atorvastatin (LIPITOR) 80 MG tablet Take 80 mg by mouth daily.   Calcium Carb-Cholecalciferol (CALCIUM HIGH POTENCY/VITAMIN D) 600-5 MG-MCG TABS 1 tablet with food   Calcium Carbonate-Vitamin D (CALCIUM + D PO) Take by mouth.   Cholecalciferol (VITAMIN D-3 PO) Take by mouth.   dicyclomine  (BENTYL ) 20 MG tablet Take 0.5 tablets (10 mg total) by mouth daily as needed for spasms.   escitalopram  (LEXAPRO ) 10 MG tablet Take 1 tablet by mouth daily.  Multiple Vitamins-Minerals (ONE A DAY WOMEN 50 PLUS PO) Take by mouth.   No facility-administered encounter medications on file as of 07/25/2024.    ALLERGIES:  Allergies[1]   FAMILY HISTORY:  Family History  Problem Relation Age of Onset   Hypertension Mother    Breast cancer Mother 80       second cancer at 61   Heart disease Mother    Aneurysm Father        AORTIC   Kidney cancer Maternal Aunt 72   Breast cancer Paternal Aunt    Prostate cancer Paternal Uncle 62   Stroke Maternal Grandmother    Heart disease Maternal Grandfather    Breast cancer Other        MGMs sister   Breast cancer Other        PGMs sister   Breast cancer Other        PGF sister   Colon cancer Neg Hx      SOCIAL HISTORY:  Social Connections: Not on file    REVIEW OF SYSTEMS:  + weight  loss, pelvic pain Denies appetite changes, fevers, chills, fatigue. Denies hearing loss, neck lumps or masses, mouth sores, ringing in ears or voice changes. Denies cough or wheezing.  Denies shortness of breath. Denies chest pain or palpitations. Denies leg swelling. Denies abdominal distention, pain, blood in stools, constipation, diarrhea, nausea, vomiting, or early satiety. Denies pain with intercourse, dysuria, frequency, hematuria or incontinence. Denies hot flashes, vaginal bleeding or vaginal discharge.   Denies joint pain, back pain or muscle pain/cramps. Denies itching, rash, or wounds. Denies dizziness, headaches, numbness or seizures. Denies swollen lymph nodes or glands, denies easy bruising or bleeding. Denies anxiety, depression, confusion, or decreased concentration.  Physical Exam:  Vital Signs for this encounter:  Blood pressure (!) 137/58, pulse 62, temperature 98.4 F (36.9 C), temperature source Oral, resp. rate 16, height 5' 4 (1.626 m), weight 129 lb 12.8 oz (58.9 kg), SpO2 100%. Body mass index is 22.28 kg/m. General: Alert, oriented, no acute distress.  HEENT: Normocephalic, atraumatic. Sclera anicteric.  Chest: Clear to auscultation bilaterally. No wheezes, rhonchi, or rales. Cardiovascular: Regular rate and rhythm, no murmurs, rubs, or gallops.  Abdomen: Normoactive bowel sounds. Soft, nondistended, nontender to palpation. No masses or hepatosplenomegaly appreciated. No palpable fluid wave.  Extremities: Grossly normal range of motion. Warm, well perfused. No edema bilaterally.  Skin: No rashes or lesions.  Lymphatics: No cervical, supraclavicular, or inguinal adenopathy.  GU:  Normal external female genitalia. No lesions. No discharge or bleeding.             Bladder/urethra:  No lesions or masses, well supported bladder             Vagina: Mildly atrophic, no lesions.             Cervix: Normal appearing, no lesions.             Uterus: Small, mobile, no  parametrial involvement or nodularity.             Adnexa: Smooth mass appreciated in the cul-de-sac, approximately 6 cm, mobile.  Rectal: Deferred.  LABORATORY AND RADIOLOGIC DATA:  Outside medical records were reviewed to synthesize the above history, along with the history and physical obtained during the visit.   Lab Results  Component Value Date   WBC 6.9 07/15/2024   HGB 14.1 07/15/2024   HCT 42.4 07/15/2024   PLT 209 07/15/2024   GLUCOSE 116 (H) 07/15/2024  ALT 41 07/15/2024   AST 36 07/15/2024   NA 140 07/15/2024   K 3.7 07/15/2024   CL 100 07/15/2024   CREATININE 0.77 07/15/2024   BUN 17 07/15/2024   CO2 29 07/15/2024        [1]  Allergies Allergen Reactions   Aleve [Naproxen] Other (See Comments)    stomach irritation   Celebrex [Celecoxib]     Irritated stomach- caused burning   Ibuprofen     UPSET STOMACH- burning

## 2024-07-24 NOTE — H&P (View-Only) (Signed)
 GYNECOLOGIC ONCOLOGY NEW PATIENT CONSULTATION   Patient Name: Tina Nielsen  Patient Age: 73 y.o. Date of Service: 07/25/2024 Referring Provider: Amber Stalls, MD  Primary Care Provider: Dyane Anthony RAMAN, FNP Consulting Provider: Comer Dollar, MD   Assessment/Plan:  Postmenopausal patient with simple appearing adnexal mass.  Discussed recent workup and possible etiologies for her acute pain.  Given the size of the adnexal mass, I suspect that she may have had ovarian torsion.  Reviewed that the ovary can sometimes detorsed and otherwise if it stays twisted, may slowly die over time.  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  We reviewed the risk of recurrent ovarian torsion and that this could cause similar pain in the future.  Ultimately, based on MRI images, my concern for borderline tumor or malignancy is quite low.  I offered the patient definitive surgery for diagnosis but also to help prevent future pain episodes versus surveillance.  Ultimately, she would like to move forward with surgery.  She would like to have least amount of surgery necessary.  Reviewed recommendation for bilateral salpingo-oophorectomy.  Plan to send the ovarian mass for frozen section.  If benign, no additional procedures would be indicated.  If borderline tumor, would proceed with staging including total hysterectomy, peritoneal biopsies, and omentectomy.  In the setting of an ovarian cancer, lymphadenectomy may also be indicated.  We reviewed the plan for a robotic assisted bilateral salpingo-oophorectomy, possible staging, possible laparotomy. The risks of surgery were discussed in detail and she understands these to include infection; wound separation; hernia; vaginal cuff separation, injury to adjacent organs such as bowel, bladder, blood vessels, ureters and nerves; bleeding which may require blood transfusion; anesthesia risk;  thromboembolic events; possible death; unforeseen complications; possible need for re-exploration; medical complications such as heart attack, stroke, pleural effusion and pneumonia. The patient will receive DVT and antibiotic prophylaxis as indicated. She voiced a clear understanding. She had the opportunity to ask questions. Perioperative instructions were reviewed with her. Prescriptions for post-op medications were sent to her pharmacy of choice.  A copy of this note was sent to the patient's referring provider.   60 minutes of total time was spent for this patient encounter, including preparation, face-to-face counseling with the patient and coordination of care, and documentation of the encounter.  Comer Dollar, MD  Division of Gynecologic Oncology  Department of Obstetrics and Gynecology  [facility]  ___________________________________________  Chief Complaint: No chief complaint on file.   History of Present Illness:  Tina Nielsen is a 73 y.o. y.o. female who is seen in consultation at the request of Dr. Stalls for an evaluation of an adnexal mass.  Patient presented to the emergency department on 12/9 with right lower quadrant pain that worsened over the course of the day and became associated with 3 episodes of emesis.  CT of the abdomen and pelvis was performed during that emergency department visit and notable for a 6 cm cystic lesion in the right adnexa with adjacent soft tissue density, which may represent solid nodule.  No peritoneal thickening, omental caking, ascites, or adenopathy. Given reassuring exam, plan was made for outpatient workup and treatment. MRI Pelvis on 07/24/2024: Uterus measures 8.3 cm, normal in morphology.  Nabothian cyst noted.  Exophytic off of the inferior right ovary is a simple cystic lesion measuring up to 5.7 cm.  No postcontrast enhancement or restricted diffusion.  No other concerning features.  Left ovary normal in  appearance.  Patient comes in with her husband today.  She describes having bellyache last Tuesday when she woke up that worsened over the day.  She began having right lower quadrant pain and then started throwing up.  Thought she might have appendicitis.  Went to the emergency department at [facility].  Pain slowly improved.  She was able to work the next day but was still having some pain.  She now endorses some mild right pelvic pain and pressure.  Denies any further nausea or emesis.  Endorses a good appetite.  Reports about 5 pounds of weight loss over the fall but thinks this may be due to to eating better.  Has some constipation at baseline, uses mineral oil as needed.  The patient's history is notable for breast cancer in the setting of a CHEK2 mutation.  She underwent lumpectomy and sentinel lymph node biopsy followed by adjuvant radiation.  She started tamoxifen  in 2017 and transition to anastrozole  in 2020.  Completed 5 years of treatment.  PAST MEDICAL HISTORY:  Past Medical History:  Diagnosis Date   Anxiety    Arthritis    knees   Atrophic vaginitis    Breast cancer of upper-outer quadrant of right female breast (HCC) 09/03/2015   Family history of breast cancer    High cholesterol    Osteopenia      PAST SURGICAL HISTORY:  Past Surgical History:  Procedure Laterality Date   BREAST LUMPECTOMY WITH NEEDLE LOCALIZATION AND AXILLARY SENTINEL LYMPH NODE BX Right 09/30/2015   Procedure: RIGHT BREAST LUMPECTOMY WITH NEEDLE LOCALIZATION AND AXILLARY SENTINEL LYMPH NODE BX;  Surgeon: Donnice Bury, MD;  Location: [facility];  Service: General;  Laterality: Right;   BREAST SURGERY     BIOPSY   cataract surg Right    KNEE SURGERY     TUBAL LIGATION      OB/GYN HISTORY:  OB History  Gravida Para Term Preterm AB Living  2 0   0 2  SAB IAB Ectopic Multiple Live Births  0        # Outcome Date GA Lbr Len/2nd Weight Sex Type Anes PTL Lv  2 Gravida            1 Gravida             No LMP recorded. Patient is postmenopausal.  Age at menarche: 43  Age at menopause: 66 Hx of HRT: denies Hx of STDs: denies Last pap: 2023 History of abnormal pap smears: denies  SCREENING STUDIES:  Last mammogram: 2025  Last colonoscopy: 2014  MEDICATIONS: Outpatient Encounter Medications as of 07/25/2024  Medication Sig   senna-docusate (SENOKOT-S) 8.6-50 MG tablet Take 2 tablets by mouth at bedtime. For AFTER surgery, do not take if having diarrhea   traMADol  (ULTRAM ) 50 MG tablet Take 1 tablet (50 mg total) by mouth every 6 (six) hours as needed for moderate pain (pain score 4-6). For AFTER surgery only, do not take and drive   atorvastatin (LIPITOR) 80 MG tablet Take 80 mg by mouth daily.   Calcium Carb-Cholecalciferol (CALCIUM HIGH POTENCY/VITAMIN D) 600-5 MG-MCG TABS 1 tablet with food   Calcium Carbonate-Vitamin D (CALCIUM + D PO) Take by mouth.   Cholecalciferol (VITAMIN D-3 PO) Take by mouth.   dicyclomine  (BENTYL ) 20 MG tablet Take 0.5 tablets (10 mg total) by mouth daily as needed for spasms.   escitalopram  (LEXAPRO ) 10 MG tablet Take 1 tablet by mouth daily.  Multiple Vitamins-Minerals (ONE A DAY WOMEN 50 PLUS PO) Take by mouth.   No facility-administered encounter medications on file as of 07/25/2024.    ALLERGIES:  Allergies[1]   FAMILY HISTORY:  Family History  Problem Relation Age of Onset   Hypertension Mother    Breast cancer Mother 5       second cancer at 51   Heart disease Mother    Aneurysm Father        AORTIC   Kidney cancer Maternal Aunt 67   Breast cancer Paternal Aunt    Prostate cancer Paternal Uncle 61   Stroke Maternal Grandmother    Heart disease Maternal Grandfather    Breast cancer Other        MGMs sister   Breast cancer Other        PGMs sister   Breast cancer Other        PGF sister   Colon cancer Neg Hx      SOCIAL HISTORY:  Social Connections: Not on file    REVIEW OF SYSTEMS:  + weight  loss, pelvic pain Denies appetite changes, fevers, chills, fatigue. Denies hearing loss, neck lumps or masses, mouth sores, ringing in ears or voice changes. Denies cough or wheezing.  Denies shortness of breath. Denies chest pain or palpitations. Denies leg swelling. Denies abdominal distention, pain, blood in stools, constipation, diarrhea, nausea, vomiting, or early satiety. Denies pain with intercourse, dysuria, frequency, hematuria or incontinence. Denies hot flashes, vaginal bleeding or vaginal discharge.   Denies joint pain, back pain or muscle pain/cramps. Denies itching, rash, or wounds. Denies dizziness, headaches, numbness or seizures. Denies swollen lymph nodes or glands, denies easy bruising or bleeding. Denies anxiety, depression, confusion, or decreased concentration.  Physical Exam:  Vital Signs for this encounter:  Blood pressure (!) 137/58, pulse 62, temperature 98.4 F (36.9 C), temperature source Oral, resp. rate 16, height 5' 4 (1.626 m), weight 129 lb 12.8 oz (58.9 kg), SpO2 100%. Body mass index is 22.28 kg/m. General: Alert, oriented, no acute distress.  HEENT: Normocephalic, atraumatic. Sclera anicteric.  Chest: Clear to auscultation bilaterally. No wheezes, rhonchi, or rales. Cardiovascular: Regular rate and rhythm, no murmurs, rubs, or gallops.  Abdomen: Normoactive bowel sounds. Soft, nondistended, nontender to palpation. No masses or hepatosplenomegaly appreciated. No palpable fluid wave.  Extremities: Grossly normal range of motion. Warm, well perfused. No edema bilaterally.  Skin: No rashes or lesions.  Lymphatics: No cervical, supraclavicular, or inguinal adenopathy.  GU:  Normal external female genitalia. No lesions. No discharge or bleeding.             Bladder/urethra:  No lesions or masses, well supported bladder             Vagina: Mildly atrophic, no lesions.             Cervix: Normal appearing, no lesions.             Uterus: Small, mobile, no  parametrial involvement or nodularity.             Adnexa: Smooth mass appreciated in the cul-de-sac, approximately 6 cm, mobile.  Rectal: Deferred.  LABORATORY AND RADIOLOGIC DATA:  Outside medical records were reviewed to synthesize the above history, along with the history and physical obtained during the visit.   Lab Results  Component Value Date   WBC 6.9 07/15/2024   HGB 14.1 07/15/2024   HCT 42.4 07/15/2024   PLT 209 07/15/2024   GLUCOSE 116 (H) 07/15/2024  ALT 41 07/15/2024   AST 36 07/15/2024   NA 140 07/15/2024   K 3.7 07/15/2024   CL 100 07/15/2024   CREATININE 0.77 07/15/2024   BUN 17 07/15/2024   CO2 29 07/15/2024        [1]  Allergies Allergen Reactions   Aleve [Naproxen] Other (See Comments)    stomach irritation   Celebrex [Celecoxib]     Irritated stomach- caused burning   Ibuprofen     UPSET STOMACH- burning

## 2024-07-25 ENCOUNTER — Inpatient Hospital Stay: Admitting: Gynecologic Oncology

## 2024-07-25 ENCOUNTER — Inpatient Hospital Stay: Attending: Hematology and Oncology | Admitting: Gynecologic Oncology

## 2024-07-25 ENCOUNTER — Encounter: Payer: Self-pay | Admitting: Gynecologic Oncology

## 2024-07-25 VITALS — BP 137/58 | HR 62 | Temp 98.4°F | Resp 16 | Ht 64.0 in | Wt 129.8 lb

## 2024-07-25 DIAGNOSIS — C50411 Malignant neoplasm of upper-outer quadrant of right female breast: Secondary | ICD-10-CM | POA: Diagnosis not present

## 2024-07-25 DIAGNOSIS — Z1501 Genetic susceptibility to malignant neoplasm of breast: Secondary | ICD-10-CM

## 2024-07-25 DIAGNOSIS — N83209 Unspecified ovarian cyst, unspecified side: Secondary | ICD-10-CM | POA: Diagnosis not present

## 2024-07-25 DIAGNOSIS — Z1509 Genetic susceptibility to other malignant neoplasm: Secondary | ICD-10-CM

## 2024-07-25 DIAGNOSIS — Z170 Estrogen receptor positive status [ER+]: Secondary | ICD-10-CM

## 2024-07-25 DIAGNOSIS — Z1589 Genetic susceptibility to other disease: Secondary | ICD-10-CM | POA: Diagnosis not present

## 2024-07-25 DIAGNOSIS — R102 Pelvic and perineal pain unspecified side: Secondary | ICD-10-CM | POA: Diagnosis not present

## 2024-07-25 MED ORDER — SENNOSIDES-DOCUSATE SODIUM 8.6-50 MG PO TABS: 2.0000 | ORAL_TABLET | Freq: Every day | ORAL | 0 refills | Status: DC

## 2024-07-25 MED ORDER — TRAMADOL HCL 50 MG PO TABS: 50.0000 mg | ORAL_TABLET | Freq: Four times a day (QID) | ORAL | 0 refills | Status: DC | PRN

## 2024-07-25 NOTE — Patient Instructions (Signed)
 Preparing for your Surgery  Plan for surgery on 07/30/2024 with Dr. Comer Dollar at [facility]. You will be scheduled for robotic assisted laparoscopic bilateral salpingo-oophorectomy (removal of both ovaries and fallopian tubes), possible staging including a hysterectomy (removal of the uterus and cervix) if a precancer or cancer is seen.   Pre-operative Testing -You will receive a phone call from presurgical testing at [facility] to arrange for a pre-operative appointment and lab work.  -Bring your insurance card, copy of an advanced directive if applicable, medication list  -At that visit, you will be asked to sign a consent for a possible blood transfusion in case a transfusion becomes necessary during surgery.  The need for a blood transfusion is rare but having consent is a necessary part of your care.     -You should not be taking blood thinners or aspirin at least ten days prior to surgery unless instructed by your surgeon.  -Do not take supplements such as fish oil (omega 3), red yeast rice, turmeric before your surgery. STOP TAKING AT LEAST 10 DAYS BEFORE SURGERY. You want to avoid medications with aspirin in them including headache powders such as BC or Goody's), Excedrin migraine.  -If you are taking a GLP-1 medication/injection such as Ozempic, Mounjaro, [id number], this needs to be held before surgery for at least 7 days before.  Day Before Surgery at Home -You will be asked to take in a light diet the day before surgery. You will be advised you can have clear liquids up until 3 hours before your surgery.    Eat a light diet the day before surgery.  Examples including soups, broths, toast, yogurt, mashed potatoes.  AVOID GAS PRODUCING FOODS AND BEVERAGES. Things to avoid include carbonated beverages (fizzy beverages, sodas), raw fruits and raw vegetables (uncooked), or beans.   If your bowels are filled with gas, your surgeon will have difficulty visualizing  your pelvic organs which increases your surgical risks.  Your role in recovery Your role is to become active as soon as directed by your doctor, while still giving yourself time to heal.  Rest when you feel tired. You will be asked to do the following in order to speed your recovery:  - Cough and breathe deeply. This helps to clear and expand your lungs and can prevent pneumonia after surgery.  - STAY ACTIVE WHEN YOU GET HOME. Do mild physical activity. Walking or moving your legs help your circulation and body functions return to normal. Do not try to get up or walk alone the first time after surgery.   -If you develop swelling on one leg or the other, pain in the back of your leg, redness/warmth in one of your legs, please call the office or go to the Emergency Room to have a doppler to rule out a blood clot. For shortness of breath, chest pain-seek care in the Emergency Room as soon as possible. - Actively manage your pain. Managing your pain lets you move in comfort. We will ask you to rate your pain on a scale of zero to 10. It is your responsibility to tell your doctor or nurse where and how much you hurt so your pain can be treated.  Special Considerations -If you are diabetic, you may be placed on insulin after surgery to have closer control over your blood sugars to promote healing and recovery.  This does not mean that you will be discharged on insulin.  If applicable, your oral antidiabetics will be  resumed when you are tolerating a solid diet.  -Your final pathology results from surgery should be available around one week after surgery and the results will be relayed to you when available.  -FMLA forms can be faxed to [phone] and please allow 5-7 business days for completion.  Pain Management After Surgery -You will be prescribed your pain medication and bowel regimen medications before surgery so that you can have these available when you are discharged from the hospital. The pain  medication is for use ONLY AFTER surgery and a new prescription will not be given.   -Make sure that you have Tylenol  IF YOU ARE ABLE TO TAKE THESE MEDICATION at home to use on a regular basis after surgery for pain control.   -Review the attached handout on narcotic use and their risks and side effects.   Bowel Regimen -You will be prescribed Sennakot-S to take nightly to prevent constipation especially if you are taking the narcotic pain medication intermittently.  It is important to prevent constipation and drink adequate amounts of liquids. You can stop taking this medication when you are not taking pain medication and you are back on your normal bowel routine.  Risks of Surgery Risks of surgery are low but include bleeding, infection, damage to surrounding structures, re-operation, blood clots, and very rarely death.   Blood Transfusion Information (For the consent to be signed before surgery)  We will be checking your blood type before surgery so in case of emergencies, we will know what type of blood you would need.                                            WHAT IS A BLOOD TRANSFUSION?  A transfusion is the replacement of blood or some of its parts. Blood is made up of multiple cells which provide different functions. Red blood cells carry oxygen and are used for blood loss replacement. White blood cells fight against infection. Platelets control bleeding. Plasma helps clot blood. Other blood products are available for specialized needs, such as hemophilia or other clotting disorders. BEFORE THE TRANSFUSION  Who gives blood for transfusions?  You may be able to donate blood to be used at a later date on yourself (autologous donation). Relatives can be asked to donate blood. This is generally not any safer than if you have received blood from a stranger. The same precautions are taken to ensure safety when a relative's blood is donated. Healthy volunteers who are fully evaluated  to make sure their blood is safe. This is blood bank blood. Transfusion therapy is the safest it has ever been in the practice of medicine. Before blood is taken from a donor, a complete history is taken to make sure that person has no history of diseases nor engages in risky social behavior (examples are intravenous drug use or sexual activity with multiple partners). The donor's travel history is screened to minimize risk of transmitting infections, such as malaria. The donated blood is tested for signs of infectious diseases, such as HIV and hepatitis. The blood is then tested to be sure it is compatible with you in order to minimize the chance of a transfusion reaction. If you or a relative donates blood, this is often done in anticipation of surgery and is not appropriate for emergency situations. It takes many days to process the donated blood. RISKS AND COMPLICATIONS  Although transfusion therapy is very safe and saves many lives, the main dangers of transfusion include:  Getting an infectious disease. Developing a transfusion reaction. This is an allergic reaction to something in the blood you were given. Every precaution is taken to prevent this. The decision to have a blood transfusion has been considered carefully by your caregiver before blood is given. Blood is not given unless the benefits outweigh the risks.  AFTER SURGERY INSTRUCTIONS  Return to work: 4-6 weeks if applicable  Activity: 1. Be up and out of the bed during the day.  Take a nap if needed.  You may walk up steps but be careful and use the hand rail.  Stair climbing will tire you more than you think, you may need to stop part way and rest.   2. No lifting or straining for 6 weeks over 10 pounds. No pushing, pulling, straining for 6 weeks.  3. No driving for 4-89 days when the following criteria have been met: Do not drive if you are taking narcotic pain medicine and make sure that your reaction time has returned.   4. You  can shower as soon as the next day after surgery. Shower daily.  Use your regular soap and water (not directly on the incision) and pat your incision(s) dry afterwards; don't rub.  No tub baths or submerging your body in water until cleared by your surgeon. If you have the soap that was given to you by pre-surgical testing that was used before surgery, you do not need to use it afterwards because this can irritate your incisions.   5. No sexual activity and nothing in the vagina for 6 weeks, 12 weeks if you have a hysterectomy.  6. You may experience a small amount of clear drainage from your incisions, which is normal.  If the drainage persists, increases, or changes color please call the office.  7. Do not use creams, lotions, or ointments such as neosporin on your incisions after surgery until advised by your surgeon because they can cause removal of the dermabond glue on your incisions.    8. You may experience vaginal spotting after surgery or when the stitches at the top of the vagina begin to dissolve (if you have a hysterectomy).  The spotting is normal but if you experience heavy bleeding, call our office.  9. Take Tylenol  first for pain if you are able to take these medications and only use narcotic pain medication for severe pain not relieved by the Tylenol .  Monitor your Tylenol  intake to a max of 4,000 mg in a 24 hour period.   Diet: 1. Low sodium Heart Healthy Diet is recommended but you are cleared to resume your normal (before surgery) diet after your procedure.  2. It is safe to use a laxative, such as Miralax or Colace, if you have difficulty moving your bowels before surgery. You have been prescribed Sennakot-S to take at bedtime every evening after surgery to keep bowel movements regular and to prevent constipation.    Wound Care: 1. Keep clean and dry.  Shower daily.  Reasons to call the Doctor: Fever - Oral temperature greater than 100.4 degrees Fahrenheit Foul-smelling  vaginal discharge Difficulty urinating Nausea and vomiting Increased pain at the site of the incision that is unrelieved with pain medicine. Difficulty breathing with or without chest pain New calf pain especially if only on one side Sudden, continuing increased vaginal bleeding with or without clots.   Contacts: For questions or concerns  you should contact:  Dr. Comer Dollar at [phone]  Tina Epps, NP at [phone]  After Hours: call [phone] and have the GYN Oncologist paged/contacted (after 5 pm or on the weekends). You will speak with an after hours RN and let he or she know you have had surgery.  Messages sent via mychart are for non-urgent matters and are not responded to after hours so for urgent needs, please call the after hours number.

## 2024-07-28 ENCOUNTER — Encounter (HOSPITAL_COMMUNITY)
Admission: RE | Admit: 2024-07-28 | Discharge: 2024-07-28 | Disposition: A | Discharge status: Home / Self Care | Source: Ambulatory Visit | Attending: Gynecologic Oncology | Admitting: Gynecologic Oncology

## 2024-07-28 ENCOUNTER — Encounter (HOSPITAL_COMMUNITY): Payer: Self-pay

## 2024-07-28 ENCOUNTER — Other Ambulatory Visit: Payer: Self-pay

## 2024-07-28 VITALS — BP 114/67 | HR 70 | Temp 98.1°F | Resp 16 | Ht 64.0 in | Wt 125.0 lb

## 2024-07-28 DIAGNOSIS — Z01818 Encounter for other preprocedural examination: Secondary | ICD-10-CM

## 2024-07-28 DIAGNOSIS — N83209 Unspecified ovarian cyst, unspecified side: Secondary | ICD-10-CM | POA: Diagnosis not present

## 2024-07-28 DIAGNOSIS — Z01812 Encounter for preprocedural laboratory examination: Secondary | ICD-10-CM | POA: Diagnosis not present

## 2024-07-28 NOTE — Telephone Encounter (Signed)
-----   Message from Amber Stalls, MD sent at 07/24/2024  4:58 PM EST ----- MR pelvis reports suggests benign cyst, they are recommending a repeat MRI in 6-12 months. Please let the pt know.

## 2024-07-28 NOTE — Progress Notes (Signed)
 Request routed to Dr. LOIS Dollar to send pre op orders for PST visit 07/28/24.

## 2024-07-28 NOTE — Progress Notes (Addendum)
 COVID Vaccine received:  []  No [x]  Yes Date of any COVID positive Test in last 90 days: no PCP - Anthony Hint FNP Cardiologist - n/a  Chest x-ray -  EKG -   Stress Test -  ECHO -  Cardiac Cath -   Bowel Prep - [x]  No  []   Yes ______  Pacemaker / ICD device [x]  No []  Yes   Spinal Cord Stimulator:[x]  No []  Yes       History of Sleep Apnea? [x]  No []  Yes   CPAP used?- [x]  No []  Yes    Does the patient monitor blood sugar?          [x]  No []  Yes  []  N/A  Patient has: [x]  NO Hx DM   []  Pre-DM                 []  DM1  []   DM2 Does patient have a [employer] or Dexacom? []  No []  Yes   Fasting Blood Sugar Ranges-  Checks Blood Sugar _____ times a day  GLP1 agonist / usual dose - no GLP1 instructions:  SGLT-2 inhibitors / usual dose - no SGLT-2 instructions:   Blood Thinner / Instructions:no Aspirin Instructions:no  Comments:   Activity level: Patient is able  to climb a flight of stairs without difficulty; [x]  No CP  [x]  No SOB, but would have knee pain___   Patient can  perform ADLs without assistance.   Anesthesia review:   Patient denies shortness of breath, fever, cough and chest pain at PAT appointment.  Patient verbalized understanding and agreement to the Pre-Surgical Instructions that were given to them at this PAT appointment. Patient was also educated of the need to review these PAT instructions again prior to his/her surgery.I reviewed the appropriate phone numbers to call if they have any and questions or concerns.

## 2024-07-28 NOTE — Telephone Encounter (Signed)
 Attempted to call pt. LVM for call back

## 2024-07-28 NOTE — Patient Instructions (Addendum)
 SURGICAL WAITING ROOM VISITATION  Patients having surgery or a procedure may have no more than 2 support people in the waiting area - these visitors may rotate.    Children ages 18 and under will not be able to visit patients in [facility] under most circumstances.   Visitors with respiratory illnesses are discouraged from visiting and should remain at home.  If the patient needs to stay at the hospital during part of their recovery, the visitor guidelines for inpatient rooms apply. Pre-op nurse will coordinate an appropriate time for 1 support person to accompany patient in pre-op.  This support person may not rotate.    Please refer to the [facility] website for the visitor guidelines for Inpatients (after your surgery is over and you are in a regular room).       Your procedure is scheduled on: 07/30/24   Report to [facility] Main Entrance    Report to admitting at 7:45 AM   Call this number if you have problems the morning of surgery [phone]   Do not eat food :After Midnight.   After Midnight you may have the following liquids until _7 AM DAY OF SURGERY  Water Non-Citrus Juices (without pulp, NO RED-Apple, White grape, White cranberry) Black Coffee (NO MILK/CREAM OR CREAMERS, sugar ok)  Clear Tea (NO MILK/CREAM OR CREAMERS, sugar ok) regular and decaf                             Plain Jell-O (NO RED)                                           Fruit ices (not with fruit pulp, NO RED)                                     Popsicles (NO RED)                                                               Sports drinks like Gatorade (NO RED)                      Oral Hygiene is also important to reduce your risk of infection.                                    Remember - BRUSH YOUR TEETH THE MORNING OF SURGERY WITH YOUR REGULAR TOOTHPASTE     Stop all vitamins and herbal supplements 7 days before surgery.   Take these medicines the morning of surgery  with A SIP OF WATER: atorvastatin, escitalopram              You may not have any metal on your body including hair pins, jewelry, and body piercing             Do not wear make-up, lotions, powders, perfumes/cologne, or deodorant  Do not wear nail polish including gel and S&S, artificial/acrylic nails, or any  other type of covering on natural nails including finger and toenails. If you have artificial nails, gel coating, etc. that needs to be removed by a nail salon please have this removed prior to surgery or surgery may need to be canceled/ delayed if the surgeon/ anesthesia feels like they are unable to be safely monitored.   Do not shave  48 hours prior to surgery.    Do not bring valuables to the hospital. [facility] IS NOT             RESPONSIBLE   FOR VALUABLES.   Contacts, glasses, dentures or bridgework may not be worn into surgery.  DO NOT BRING YOUR HOME MEDICATIONS TO THE HOSPITAL. PHARMACY WILL DISPENSE MEDICATIONS LISTED ON YOUR MEDICATION LIST TO YOU DURING YOUR ADMISSION IN THE HOSPITAL!    Patients discharged on the day of surgery will not be allowed to drive home.  Someone NEEDS to stay with you for the first 24 hours after anesthesia.   Special Instructions: Bring a copy of your healthcare power of attorney and living will documents the day of surgery if you haven't scanned them before.              Please read over the following fact sheets you were given: IF YOU HAVE QUESTIONS ABOUT YOUR PRE-OP INSTRUCTIONS PLEASE CALL [phone] Tina Nielsen   If you received a COVID test during your pre-op visit  it is requested that you wear a mask when out in public, stay away from anyone that may not be feeling well and notify your surgeon if you develop symptoms. If you test positive for Covid or have been in contact with anyone that has tested positive in the last 10 days please notify you surgeon.  [place] - Preparing for Surgery Before surgery, you can play an important  role.  Because skin is not sterile, your skin needs to be as free of germs as possible.  You can reduce the number of germs on your skin by washing with CHG (chlorahexidine gluconate) soap before surgery.  CHG is an antiseptic cleaner which kills germs and bonds with the skin to continue killing germs even after washing. Please DO NOT use if you have an allergy to CHG or antibacterial soaps.  If your skin becomes reddened/irritated stop using the CHG and inform your nurse when you arrive at Short Stay. Do not shave (including legs and underarms) for at least 48 hours prior to the first CHG shower.  You may shave your face/neck.  Please follow these instructions carefully:  1.  Shower with CHG Soap the night before surgery ONLY (DO NOT USE THE SOAP THE MORNING OF SURGERY).  2.  If you choose to wash your hair, wash your hair first as usual with your normal  shampoo.  3.  After you shampoo, rinse your hair and body thoroughly to remove the shampoo.                             4.  Use CHG as you would any other liquid soap.  You can apply chg directly to the skin and wash.  Gently with a scrungie or clean washcloth.  5.  Apply the CHG Soap to your body ONLY FROM THE NECK DOWN.   Do   not use on face/ open  Wound or open sores. Avoid contact with eyes, ears mouth and   genitals (private parts).                       Wash face,  Genitals (private parts) with your normal soap.             6.  Wash thoroughly, paying special attention to the area where your    surgery  will be performed.  7.  Thoroughly rinse your body with warm water from the neck down.  8.  DO NOT shower/wash with your normal soap after using and rinsing off the CHG Soap.                9.  Pat yourself dry with a clean towel.            10.  Wear clean pajamas.            11.  Place clean sheets on your bed the night of your first shower and do not  sleep with pets. Day of Surgery : Do not apply any CHG,  lotions/deodorants the morning of surgery.  Please wear clean clothes to the hospital/surgery center.  FAILURE TO FOLLOW THESE INSTRUCTIONS MAY RESULT IN THE CANCELLATION OF YOUR SURGERY  PATIENT SIGNATURE_________________________________  NURSE SIGNATURE__________________________________  ________________________________________________________________________   WHAT IS A BLOOD TRANSFUSION? Blood Transfusion Information  A transfusion is the replacement of blood or some of its parts. Blood is made up of multiple cells which provide different functions. Red blood cells carry oxygen and are used for blood loss replacement. White blood cells fight against infection. Platelets control bleeding. Plasma helps clot blood. Other blood products are available for specialized needs, such as hemophilia or other clotting disorders. BEFORE THE TRANSFUSION  Who gives blood for transfusions?  Healthy volunteers who are fully evaluated to make sure their blood is safe. This is blood bank blood. Transfusion therapy is the safest it has ever been in the practice of medicine. Before blood is taken from a donor, a complete history is taken to make sure that person has no history of diseases nor engages in risky social behavior (examples are intravenous drug use or sexual activity with multiple partners). The donor's travel history is screened to minimize risk of transmitting infections, such as malaria. The donated blood is tested for signs of infectious diseases, such as HIV and hepatitis. The blood is then tested to be sure it is compatible with you in order to minimize the chance of a transfusion reaction. If you or a relative donates blood, this is often done in anticipation of surgery and is not appropriate for emergency situations. It takes many days to process the donated blood. RISKS AND COMPLICATIONS Although transfusion therapy is very safe and saves many lives, the main dangers of transfusion include:   Getting an infectious disease. Developing a transfusion reaction. This is an allergic reaction to something in the blood you were given. Every precaution is taken to prevent this. The decision to have a blood transfusion has been considered carefully by your caregiver before blood is given. Blood is not given unless the benefits outweigh the risks. AFTER THE TRANSFUSION Right after receiving a blood transfusion, you will usually feel much better and more energetic. This is especially true if your red blood cells have gotten low (anemic). The transfusion raises the level of the red blood cells which carry oxygen, and this usually causes an energy increase. The nurse administering  the transfusion will monitor you carefully for complications. HOME CARE INSTRUCTIONS  No special instructions are needed after a transfusion. You may find your energy is better. Speak with your caregiver about any limitations on activity for underlying diseases you may have. SEEK MEDICAL CARE IF:  Your condition is not improving after your transfusion. You develop redness or irritation at the intravenous (IV) site. SEEK IMMEDIATE MEDICAL CARE IF:  Any of the following symptoms occur over the next 12 hours: Shaking chills. You have a temperature by mouth above 102 F (38.9 C), not controlled by medicine. Chest, back, or muscle pain. People around you feel you are not acting correctly or are confused. Shortness of breath or difficulty breathing. Dizziness and fainting. You get a rash or develop hives. You have a decrease in urine output. Your urine turns a dark color or changes to pink, red, or brown. Any of the following symptoms occur over the next 10 days: You have a temperature by mouth above 102 F (38.9 C), not controlled by medicine. Shortness of breath. Weakness after normal activity. The white part of the eye turns yellow (jaundice). You have a decrease in the amount of urine or are urinating less  often. Your urine turns a dark color or changes to pink, red, or brown. Document Released: 07/21/2000 Document Revised: 10/16/2011 Document Reviewed: 03/09/2008 [facility] Patient Information 2014 [place], [place].

## 2024-07-29 ENCOUNTER — Telehealth: Payer: Self-pay | Admitting: *Deleted

## 2024-07-29 NOTE — Telephone Encounter (Signed)
Telephone call to check on pre-operative status.  Patient compliant with pre-operative instructions.  Reinforced nothing to eat after midnight. Clear liquids until 0645. Patient to arrive at 0745.  No questions or concerns voiced.  Instructed to call for any needs.

## 2024-07-29 NOTE — Telephone Encounter (Signed)
 Attempted to reach patient for pre-op call. Left voicemail requesting call back.

## 2024-07-29 NOTE — Progress Notes (Signed)
 Patient here for new patient consultation with Dr. Viktoria and for a pre-operative appointment prior to her scheduled surgery on 07/30/24. She is scheduled for a robotic assisted laparoscopic bilateral salpingo-oophorectomy (removal of both ovaries and fallopian tubes), possible staging including a hysterectomy.  The surgery was discussed in detail.  See after visit summary for additional details.      Discussed post-op pain management in detail including the aspects of the enhanced recovery pathway.  Advised her that a new prescription would be sent in for tramadol  and it is only to be used for after her upcoming surgery.  We discussed the use of tylenol  post-op and to monitor for a maximum of 4,000 mg in a 24 hour period.  Also prescribed sennakot to be used after surgery and to hold if having loose stools.  Discussed bowel regimen in detail.     Discussed measures to take at home to prevent DVT including frequent mobility.  Reportable signs and symptoms of DVT discussed. Post-operative instructions discussed and expectations for after surgery. Incisional care discussed as well including reportable signs and symptoms including erythema, drainage, wound separation.     10 minutes spent preparing information and with the patient.  Verbalizing understanding of material discussed. No needs or concerns voiced at the end of the visit.   Advised patient to call for any needs.  Advised that her post-operative medications had been prescribed and could be picked up at any time.    This appointment is included in the global surgical bundle as pre-operative teaching and has no charge.

## 2024-07-29 NOTE — Patient Instructions (Signed)
 Preparing for your Surgery  Plan for surgery on 07/30/2024 with Dr. Comer Dollar at [facility]. You will be scheduled for robotic assisted laparoscopic bilateral salpingo-oophorectomy (removal of both ovaries and fallopian tubes), possible staging including a hysterectomy (removal of the uterus and cervix) if a precancer or cancer is seen.   Pre-operative Testing -You will receive a phone call from presurgical testing at [facility] to arrange for a pre-operative appointment and lab work.  -Bring your insurance card, copy of an advanced directive if applicable, medication list  -At that visit, you will be asked to sign a consent for a possible blood transfusion in case a transfusion becomes necessary during surgery.  The need for a blood transfusion is rare but having consent is a necessary part of your care.     -You should not be taking blood thinners or aspirin at least ten days prior to surgery unless instructed by your surgeon.  -Do not take supplements such as fish oil (omega 3), red yeast rice, turmeric before your surgery. STOP TAKING AT LEAST 10 DAYS BEFORE SURGERY. You want to avoid medications with aspirin in them including headache powders such as BC or Goody's), Excedrin migraine.  -If you are taking a GLP-1 medication/injection such as Ozempic, Mounjaro, [id number], this needs to be held before surgery for at least 7 days before.  Day Before Surgery at Home -You will be asked to take in a light diet the day before surgery. You will be advised you can have clear liquids up until 3 hours before your surgery.    Eat a light diet the day before surgery.  Examples including soups, broths, toast, yogurt, mashed potatoes.  AVOID GAS PRODUCING FOODS AND BEVERAGES. Things to avoid include carbonated beverages (fizzy beverages, sodas), raw fruits and raw vegetables (uncooked), or beans.   If your bowels are filled with gas, your surgeon will have difficulty visualizing  your pelvic organs which increases your surgical risks.  Your role in recovery Your role is to become active as soon as directed by your doctor, while still giving yourself time to heal.  Rest when you feel tired. You will be asked to do the following in order to speed your recovery:  - Cough and breathe deeply. This helps to clear and expand your lungs and can prevent pneumonia after surgery.  - STAY ACTIVE WHEN YOU GET HOME. Do mild physical activity. Walking or moving your legs help your circulation and body functions return to normal. Do not try to get up or walk alone the first time after surgery.   -If you develop swelling on one leg or the other, pain in the back of your leg, redness/warmth in one of your legs, please call the office or go to the Emergency Room to have a doppler to rule out a blood clot. For shortness of breath, chest pain-seek care in the Emergency Room as soon as possible. - Actively manage your pain. Managing your pain lets you move in comfort. We will ask you to rate your pain on a scale of zero to 10. It is your responsibility to tell your doctor or nurse where and how much you hurt so your pain can be treated.  Special Considerations -If you are diabetic, you may be placed on insulin after surgery to have closer control over your blood sugars to promote healing and recovery.  This does not mean that you will be discharged on insulin.  If applicable, your oral antidiabetics will be  resumed when you are tolerating a solid diet.  -Your final pathology results from surgery should be available around one week after surgery and the results will be relayed to you when available.  -FMLA forms can be faxed to [phone] and please allow 5-7 business days for completion.  Pain Management After Surgery -You will be prescribed your pain medication and bowel regimen medications before surgery so that you can have these available when you are discharged from the hospital. The pain  medication is for use ONLY AFTER surgery and a new prescription will not be given.   -Make sure that you have Tylenol  IF YOU ARE ABLE TO TAKE THESE MEDICATION at home to use on a regular basis after surgery for pain control.   -Review the attached handout on narcotic use and their risks and side effects.   Bowel Regimen -You will be prescribed Sennakot-S to take nightly to prevent constipation especially if you are taking the narcotic pain medication intermittently.  It is important to prevent constipation and drink adequate amounts of liquids. You can stop taking this medication when you are not taking pain medication and you are back on your normal bowel routine.  Risks of Surgery Risks of surgery are low but include bleeding, infection, damage to surrounding structures, re-operation, blood clots, and very rarely death.   Blood Transfusion Information (For the consent to be signed before surgery)  We will be checking your blood type before surgery so in case of emergencies, we will know what type of blood you would need.                                            WHAT IS A BLOOD TRANSFUSION?  A transfusion is the replacement of blood or some of its parts. Blood is made up of multiple cells which provide different functions. Red blood cells carry oxygen and are used for blood loss replacement. White blood cells fight against infection. Platelets control bleeding. Plasma helps clot blood. Other blood products are available for specialized needs, such as hemophilia or other clotting disorders. BEFORE THE TRANSFUSION  Who gives blood for transfusions?  You may be able to donate blood to be used at a later date on yourself (autologous donation). Relatives can be asked to donate blood. This is generally not any safer than if you have received blood from a stranger. The same precautions are taken to ensure safety when a relative's blood is donated. Healthy volunteers who are fully evaluated  to make sure their blood is safe. This is blood bank blood. Transfusion therapy is the safest it has ever been in the practice of medicine. Before blood is taken from a donor, a complete history is taken to make sure that person has no history of diseases nor engages in risky social behavior (examples are intravenous drug use or sexual activity with multiple partners). The donor's travel history is screened to minimize risk of transmitting infections, such as malaria. The donated blood is tested for signs of infectious diseases, such as HIV and hepatitis. The blood is then tested to be sure it is compatible with you in order to minimize the chance of a transfusion reaction. If you or a relative donates blood, this is often done in anticipation of surgery and is not appropriate for emergency situations. It takes many days to process the donated blood. RISKS AND COMPLICATIONS  Although transfusion therapy is very safe and saves many lives, the main dangers of transfusion include:  Getting an infectious disease. Developing a transfusion reaction. This is an allergic reaction to something in the blood you were given. Every precaution is taken to prevent this. The decision to have a blood transfusion has been considered carefully by your caregiver before blood is given. Blood is not given unless the benefits outweigh the risks.  AFTER SURGERY INSTRUCTIONS  Return to work: 4-6 weeks if applicable  Activity: 1. Be up and out of the bed during the day.  Take a nap if needed.  You may walk up steps but be careful and use the hand rail.  Stair climbing will tire you more than you think, you may need to stop part way and rest.   2. No lifting or straining for 6 weeks over 10 pounds. No pushing, pulling, straining for 6 weeks.  3. No driving for 4-89 days when the following criteria have been met: Do not drive if you are taking narcotic pain medicine and make sure that your reaction time has returned.   4. You  can shower as soon as the next day after surgery. Shower daily.  Use your regular soap and water (not directly on the incision) and pat your incision(s) dry afterwards; don't rub.  No tub baths or submerging your body in water until cleared by your surgeon. If you have the soap that was given to you by pre-surgical testing that was used before surgery, you do not need to use it afterwards because this can irritate your incisions.   5. No sexual activity and nothing in the vagina for 6 weeks, 12 weeks if you have a hysterectomy.  6. You may experience a small amount of clear drainage from your incisions, which is normal.  If the drainage persists, increases, or changes color please call the office.  7. Do not use creams, lotions, or ointments such as neosporin on your incisions after surgery until advised by your surgeon because they can cause removal of the dermabond glue on your incisions.    8. You may experience vaginal spotting after surgery or when the stitches at the top of the vagina begin to dissolve (if you have a hysterectomy).  The spotting is normal but if you experience heavy bleeding, call our office.  9. Take Tylenol  first for pain if you are able to take these medications and only use narcotic pain medication for severe pain not relieved by the Tylenol .  Monitor your Tylenol  intake to a max of 4,000 mg in a 24 hour period.   Diet: 1. Low sodium Heart Healthy Diet is recommended but you are cleared to resume your normal (before surgery) diet after your procedure.  2. It is safe to use a laxative, such as Miralax or Colace, if you have difficulty moving your bowels before surgery. You have been prescribed Sennakot-S to take at bedtime every evening after surgery to keep bowel movements regular and to prevent constipation.    Wound Care: 1. Keep clean and dry.  Shower daily.  Reasons to call the Doctor: Fever - Oral temperature greater than 100.4 degrees Fahrenheit Foul-smelling  vaginal discharge Difficulty urinating Nausea and vomiting Increased pain at the site of the incision that is unrelieved with pain medicine. Difficulty breathing with or without chest pain New calf pain especially if only on one side Sudden, continuing increased vaginal bleeding with or without clots.   Contacts: For questions or concerns  you should contact:  Dr. Comer Dollar at [phone]  Eleanor Epps, NP at [phone]  After Hours: call [phone] and have the GYN Oncologist paged/contacted (after 5 pm or on the weekends). You will speak with an after hours RN and let he or she know you have had surgery.  Messages sent via mychart are for non-urgent matters and are not responded to after hours so for urgent needs, please call the after hours number.

## 2024-07-30 ENCOUNTER — Ambulatory Visit (HOSPITAL_COMMUNITY): Admitting: Certified Registered"

## 2024-07-30 ENCOUNTER — Ambulatory Visit (HOSPITAL_COMMUNITY): Payer: Self-pay | Admitting: Medical

## 2024-07-30 ENCOUNTER — Ambulatory Visit (HOSPITAL_COMMUNITY)
Admission: RE | Admit: 2024-07-30 | Discharge: 2024-07-30 | Disposition: A | Discharge status: Home / Self Care | Attending: Gynecologic Oncology | Admitting: Gynecologic Oncology

## 2024-07-30 ENCOUNTER — Encounter (HOSPITAL_COMMUNITY)
Admission: RE | Disposition: A | Discharge status: Home / Self Care | Payer: Self-pay | Source: Home / Self Care | Attending: Gynecologic Oncology

## 2024-07-30 ENCOUNTER — Encounter (HOSPITAL_COMMUNITY): Payer: Self-pay | Admitting: Gynecologic Oncology

## 2024-07-30 DIAGNOSIS — N83202 Unspecified ovarian cyst, left side: Secondary | ICD-10-CM

## 2024-07-30 DIAGNOSIS — N83292 Other ovarian cyst, left side: Secondary | ICD-10-CM | POA: Insufficient documentation

## 2024-07-30 DIAGNOSIS — N83209 Unspecified ovarian cyst, unspecified side: Secondary | ICD-10-CM | POA: Diagnosis present

## 2024-07-30 DIAGNOSIS — M1990 Unspecified osteoarthritis, unspecified site: Secondary | ICD-10-CM | POA: Insufficient documentation

## 2024-07-30 DIAGNOSIS — N83201 Unspecified ovarian cyst, right side: Secondary | ICD-10-CM

## 2024-07-30 DIAGNOSIS — N83291 Other ovarian cyst, right side: Secondary | ICD-10-CM | POA: Insufficient documentation

## 2024-07-30 DIAGNOSIS — N8353 Torsion of ovary, ovarian pedicle and fallopian tube: Secondary | ICD-10-CM

## 2024-07-30 DIAGNOSIS — Z87891 Personal history of nicotine dependence: Secondary | ICD-10-CM | POA: Diagnosis not present

## 2024-07-30 HISTORY — PX: ROBOTIC ASSISTED BILATERAL SALPINGO OOPHERECTOMY: SHX6078

## 2024-07-30 LAB — TYPE AND SCREEN
ABO/RH(D): A POS
Antibody Screen: NEGATIVE

## 2024-07-30 LAB — ABO/RH: ABO/RH(D): A POS

## 2024-07-30 SURGERY — SALPINGO-OOPHORECTOMY, BILATERAL, ROBOT-ASSISTED
Anesthesia: General | Laterality: Bilateral

## 2024-07-30 MED ORDER — BUPIVACAINE HCL 0.25 % IJ SOLN
INTRAMUSCULAR | Status: DC | PRN
  Administered 2024-07-30: 25 mL

## 2024-07-30 MED ORDER — PROPOFOL 10 MG/ML IV BOLUS
INTRAVENOUS | Status: DC | PRN
  Administered 2024-07-30: 100 mg via INTRAVENOUS

## 2024-07-30 MED ORDER — HEPARIN SODIUM (PORCINE) 5000 UNIT/ML IJ SOLN
5000.0000 [IU] | INTRAMUSCULAR | Status: AC
  Administered 2024-07-30: 5000 [IU] via SUBCUTANEOUS
  Filled 2024-07-30: qty 1

## 2024-07-30 MED ORDER — FENTANYL CITRATE (PF) 50 MCG/ML IJ SOSY
25.0000 ug | PREFILLED_SYRINGE | INTRAMUSCULAR | Status: DC | PRN
  Administered 2024-07-30: 50 ug via INTRAVENOUS

## 2024-07-30 MED ORDER — PROPOFOL 10 MG/ML IV BOLUS
INTRAVENOUS | Status: AC
  Filled 2024-07-30: qty 20

## 2024-07-30 MED ORDER — DEXAMETHASONE SOD PHOSPHATE PF 10 MG/ML IJ SOLN
4.0000 mg | INTRAMUSCULAR | Status: AC
  Administered 2024-07-30: 10 mg via INTRAVENOUS

## 2024-07-30 MED ORDER — ACETAMINOPHEN 500 MG PO TABS
1000.0000 mg | ORAL_TABLET | ORAL | Status: AC
  Administered 2024-07-30: 1000 mg via ORAL
  Filled 2024-07-30: qty 2

## 2024-07-30 MED ORDER — GLYCOPYRROLATE 0.2 MG/ML IJ SOLN
INTRAMUSCULAR | Status: DC | PRN
  Administered 2024-07-30: .2 mg via INTRAVENOUS

## 2024-07-30 MED ORDER — SUGAMMADEX SODIUM 200 MG/2ML IV SOLN
INTRAVENOUS | Status: AC
  Filled 2024-07-30: qty 2

## 2024-07-30 MED ORDER — SUGAMMADEX SODIUM 200 MG/2ML IV SOLN
INTRAVENOUS | Status: DC | PRN
  Administered 2024-07-30: 150 mg via INTRAVENOUS

## 2024-07-30 MED ORDER — LIDOCAINE HCL (PF) 2 % IJ SOLN
INTRAMUSCULAR | Status: DC | PRN
  Administered 2024-07-30: 1.5 mg/kg/h via INTRADERMAL
  Administered 2024-07-30: 60 mg via INTRADERMAL

## 2024-07-30 MED ORDER — KETAMINE HCL 50 MG/5ML IJ SOSY
PREFILLED_SYRINGE | INTRAMUSCULAR | Status: AC
  Filled 2024-07-30: qty 5

## 2024-07-30 MED ORDER — ROCURONIUM BROMIDE 10 MG/ML (PF) SYRINGE
PREFILLED_SYRINGE | INTRAVENOUS | Status: AC
  Filled 2024-07-30: qty 10

## 2024-07-30 MED ORDER — ROCURONIUM BROMIDE 10 MG/ML (PF) SYRINGE
PREFILLED_SYRINGE | INTRAVENOUS | Status: DC | PRN
  Administered 2024-07-30: 40 mg via INTRAVENOUS
  Administered 2024-07-30: 10 mg via INTRAVENOUS

## 2024-07-30 MED ORDER — EPHEDRINE 5 MG/ML INJ
INTRAVENOUS | Status: AC
  Filled 2024-07-30: qty 5

## 2024-07-30 MED ORDER — FENTANYL CITRATE (PF) 100 MCG/2ML IJ SOLN
INTRAMUSCULAR | Status: AC
  Filled 2024-07-30: qty 2

## 2024-07-30 MED ORDER — ONDANSETRON HCL 4 MG/2ML IJ SOLN
INTRAMUSCULAR | Status: DC | PRN
  Administered 2024-07-30: 4 mg via INTRAVENOUS

## 2024-07-30 MED ORDER — LACTATED RINGERS IR SOLN
Status: DC | PRN
  Administered 2024-07-30: 1000 mL

## 2024-07-30 MED ORDER — KETAMINE HCL 50 MG/5ML IJ SOSY
PREFILLED_SYRINGE | INTRAMUSCULAR | Status: DC | PRN
  Administered 2024-07-30: 10 mg via INTRAVENOUS
  Administered 2024-07-30: 20 mg via INTRAVENOUS
  Administered 2024-07-30: 10 mg via INTRAVENOUS

## 2024-07-30 MED ORDER — BUPIVACAINE HCL (PF) 0.25 % IJ SOLN
INTRAMUSCULAR | Status: AC
  Filled 2024-07-30: qty 30

## 2024-07-30 MED ORDER — OXYCODONE HCL 5 MG/5ML PO SOLN: 5.0000 mg | Freq: Once | ORAL | Status: AC | PRN

## 2024-07-30 MED ORDER — OXYCODONE HCL 5 MG PO TABS
ORAL_TABLET | ORAL | Status: AC
  Filled 2024-07-30: qty 1

## 2024-07-30 MED ORDER — GLYCOPYRROLATE 0.2 MG/ML IJ SOLN
INTRAMUSCULAR | Status: AC
  Filled 2024-07-30: qty 1

## 2024-07-30 MED ORDER — FENTANYL CITRATE (PF) 50 MCG/ML IJ SOSY
PREFILLED_SYRINGE | INTRAMUSCULAR | Status: AC
  Filled 2024-07-30: qty 2

## 2024-07-30 MED ORDER — OXYCODONE HCL 5 MG PO TABS
5.0000 mg | ORAL_TABLET | Freq: Once | ORAL | Status: AC | PRN
  Administered 2024-07-30: 5 mg via ORAL

## 2024-07-30 MED ORDER — LIDOCAINE HCL (PF) 2 % IJ SOLN
INTRAMUSCULAR | Status: AC
  Filled 2024-07-30: qty 10

## 2024-07-30 MED ORDER — DROPERIDOL 2.5 MG/ML IJ SOLN: 0.6250 mg | Freq: Once | INTRAMUSCULAR | Status: DC | PRN

## 2024-07-30 MED ORDER — EPHEDRINE SULFATE-NACL 50-0.9 MG/10ML-% IV SOSY
PREFILLED_SYRINGE | INTRAVENOUS | Status: DC | PRN
  Administered 2024-07-30: 10 mg via INTRAVENOUS

## 2024-07-30 MED ORDER — LACTATED RINGERS IV SOLN: INTRAVENOUS | Status: DC

## 2024-07-30 MED ORDER — ORAL CARE MOUTH RINSE: 15.0000 mL | Freq: Once | OROMUCOSAL | Status: AC

## 2024-07-30 MED ORDER — ONDANSETRON HCL 4 MG/2ML IJ SOLN
INTRAMUSCULAR | Status: AC
  Filled 2024-07-30: qty 2

## 2024-07-30 MED ORDER — FENTANYL CITRATE (PF) 100 MCG/2ML IJ SOLN
INTRAMUSCULAR | Status: DC | PRN
  Administered 2024-07-30 (×2): 50 ug via INTRAVENOUS

## 2024-07-30 MED ORDER — SUGAMMADEX SODIUM 200 MG/2ML IV SOLN
INTRAVENOUS | Status: AC
  Filled 2024-07-30: qty 4

## 2024-07-30 MED ORDER — CHLORHEXIDINE GLUCONATE 0.12 % MT SOLN
15.0000 mL | Freq: Once | OROMUCOSAL | Status: AC
  Administered 2024-07-30: 15 mL via OROMUCOSAL

## 2024-07-30 SURGICAL SUPPLY — 73 items
APPLICATOR SURGIFLO ENDO (HEMOSTASIS) IMPLANT
BAG COUNTER SPONGE SURGICOUNT (BAG) IMPLANT
BAG LAPAROSCOPIC 12 15 PORT 16 (BASKET) IMPLANT
BLADE SURG SZ10 CARB STEEL (BLADE) IMPLANT
COVER BACK TABLE 60X90IN (DRAPES) ×1 IMPLANT
COVER TIP SHEARS 8 DVNC (MISCELLANEOUS) ×1 IMPLANT
DERMABOND ADVANCED .7 DNX12 (GAUZE/BANDAGES/DRESSINGS) ×1 IMPLANT
DRAPE ARM DVNC X/XI (DISPOSABLE) ×4 IMPLANT
DRAPE COLUMN DVNC XI (DISPOSABLE) ×1 IMPLANT
DRAPE SHEET LG 3/4 BI-LAMINATE (DRAPES) ×1 IMPLANT
DRAPE SURG IRRIG POUCH 19X23 (DRAPES) ×1 IMPLANT
DRIVER NDL MEGA SUTCUT DVNCXI (INSTRUMENTS) ×1 IMPLANT
DRIVER NDLE MEGA SUTCUT DVNCXI (INSTRUMENTS) ×1 IMPLANT
DRSG OPSITE POSTOP 4X6 (GAUZE/BANDAGES/DRESSINGS) IMPLANT
DRSG OPSITE POSTOP 4X8 (GAUZE/BANDAGES/DRESSINGS) IMPLANT
ELECT PENCIL ROCKER SW 15FT (MISCELLANEOUS) IMPLANT
ELECT REM PT RETURN 15FT ADLT (MISCELLANEOUS) ×1 IMPLANT
FORCEPS BPLR FENES DVNC XI (FORCEP) ×1 IMPLANT
FORCEPS PROGRASP DVNC XI (FORCEP) ×1 IMPLANT
GAUZE 4X4 16PLY ~~LOC~~+RFID DBL (SPONGE) ×2 IMPLANT
GLOVE BIO SURGEON STRL SZ 6 (GLOVE) ×4 IMPLANT
GLOVE BIO SURGEON STRL SZ 6.5 (GLOVE) ×1 IMPLANT
GOWN STRL REUS W/ TWL LRG LVL3 (GOWN DISPOSABLE) ×4 IMPLANT
GRASPER SUT TROCAR 14GX15 (MISCELLANEOUS) IMPLANT
HOLDER FOLEY CATH W/STRAP (MISCELLANEOUS) IMPLANT
IRRIGATION SUCT STRKRFLW 2 WTP (MISCELLANEOUS) ×1 IMPLANT
KIT PROCEDURE DVNC SI (MISCELLANEOUS) IMPLANT
KIT TURNOVER KIT A (KITS) ×1 IMPLANT
LIGASURE IMPACT 36 18CM CVD LR (INSTRUMENTS) IMPLANT
MANIPULATOR ADVINCU DEL 3.0 PL (MISCELLANEOUS) IMPLANT
MANIPULATOR ADVINCU DEL 3.5 PL (MISCELLANEOUS) IMPLANT
MANIPULATOR UTERINE 4.5 ZUMI (MISCELLANEOUS) IMPLANT
NDL HYPO 21X1.5 SAFETY (NEEDLE) ×1 IMPLANT
NDL SPNL 18GX3.5 QUINCKE PK (NEEDLE) IMPLANT
NEEDLE HYPO 21X1.5 SAFETY (NEEDLE) ×1 IMPLANT
NEEDLE SPNL 18GX3.5 QUINCKE PK (NEEDLE) IMPLANT
OBTURATOR OPTICALSTD 8 DVNC (TROCAR) ×1 IMPLANT
PACK ROBOT GYN CUSTOM WL (TRAY / TRAY PROCEDURE) ×1 IMPLANT
PAD POSITIONING PINK XL (MISCELLANEOUS) ×1 IMPLANT
PORT ACCESS TROCAR AIRSEAL 12 (TROCAR) IMPLANT
POWDER SURGICEL 3.0 GRAM (HEMOSTASIS) IMPLANT
SCISSORS LAP 5X45 EPIX DISP (ENDOMECHANICALS) IMPLANT
SCISSORS MNPLR CVD DVNC XI (INSTRUMENTS) ×1 IMPLANT
SCRUB CHG 4% DYNA-HEX 4OZ (MISCELLANEOUS) IMPLANT
SEAL UNIV 5-12 XI (MISCELLANEOUS) ×4 IMPLANT
SET TRI-LUMEN FLTR TB AIRSEAL (TUBING) ×1 IMPLANT
SPIKE FLUID TRANSFER (MISCELLANEOUS) ×1 IMPLANT
SPONGE T-LAP 18X18 ~~LOC~~+RFID (SPONGE) IMPLANT
SURGIFLO W/THROMBIN 8M KIT (HEMOSTASIS) IMPLANT
SUT MNCRL AB 4-0 PS2 18 (SUTURE) IMPLANT
SUT PDS AB 1 TP1 96 (SUTURE) IMPLANT
SUT STRATA PDS 0 30 CT-2.5 (SUTURE) IMPLANT
SUT STRATAFIX PDS+0 CT1 9 (SUTURE) IMPLANT
SUT V-LOC 180 0-0 GS22 (SUTURE) IMPLANT
SUT VIC AB 0 CT1 27XBRD ANTBC (SUTURE) IMPLANT
SUT VIC AB 2-0 CT1 TAPERPNT 27 (SUTURE) IMPLANT
SUT VIC AB 2-0 SH 27X BRD (SUTURE) IMPLANT
SUT VIC AB 4-0 PS2 18 (SUTURE) ×2 IMPLANT
SUT VICRYL 0 27 CT2 27 ABS (SUTURE) ×1 IMPLANT
SUT VLOC 180 0 9IN GS21 (SUTURE) IMPLANT
SUTURE STRATFX 0 PDS+ CT-2 23 (SUTURE) IMPLANT
SYR 10ML LL (SYRINGE) IMPLANT
SYSTEM BAG RETRIEVAL 10MM (BASKET) IMPLANT
SYSTEM RETRIEVL 5MM INZII UNIV (BASKET) IMPLANT
SYSTEM WOUND ALEXIS 18CM MED (MISCELLANEOUS) IMPLANT
TIP ENDOSCOPIC SURGICEL (TIP) IMPLANT
TRAP SPECIMEN MUCUS 40CC (MISCELLANEOUS) IMPLANT
TRAY FOLEY MTR SLVR 16FR STAT (SET/KITS/TRAYS/PACK) ×1 IMPLANT
TROCAR PORT AIRSEAL 5X120 (TROCAR) IMPLANT
TROCAR XCEL NON-BLD 5MMX100MML (ENDOMECHANICALS) IMPLANT
UNDERPAD 30X36 HEAVY ABSORB (UNDERPADS AND DIAPERS) ×2 IMPLANT
WATER STERILE IRR 1000ML POUR (IV SOLUTION) ×1 IMPLANT
YANKAUER SUCT BULB TIP 10FT TU (MISCELLANEOUS) IMPLANT

## 2024-07-30 NOTE — Interval H&P Note (Signed)
 History and Physical Interval Note:  07/30/2024 8:40 AM  Tina Nielsen  has presented today for surgery, with the diagnosis of ovarian cyst.  The various methods of treatment have been discussed with the patient and family. After consideration of risks, benefits and other options for treatment, the patient has consented to  Procedures with comments: SALPINGO-OOPHORECTOMY, BILATERAL, ROBOT-ASSISTED (Bilateral) HYSTERECTOMY, TOTAL, ROBOT-ASSISTED (N/A) - possible staging including total hysterectomy as a surgical intervention.  The patient's history has been reviewed, patient examined, no change in status, stable for surgery.  I have reviewed the patient's chart and labs.  Questions were answered to the patient's satisfaction.     Comer JONELLE Dollar

## 2024-07-30 NOTE — Op Note (Signed)
 OPERATIVE NOTE  Pre-operative Diagnosis: Adnexal mass, suspected torsion  Post-operative Diagnosis: same, torsed right ovary/tube  Operation: Robotic-assisted laparoscopic bilateral salpingo-oophorectomy   Surgeon: Viktoria Crank MD  Assistant Surgeon: Olam Leonce Ada MD (an MD assistant was necessary for tissue manipulation, management of robotic instrumentation, retraction and positioning due to the complexity of the case and hospital policies).   Anesthesia: GET  Urine Output: 200 cc  Operative Findings: On EUA, mobile mass within the cul de sac. ON intra-abdominal entry, normal upper abdominal survey. Normal omentum, small and large bowel. Uterus 8 cm and normal in appearance. 6 cm simple cyst on the right ovary; right adnexa torsed x1 although still appears viable. Left ovary with 1 cm simple appearing cyst. No ascites.  On frozen, simple/smooth cyst with nothing to freeze.  Estimated Blood Loss:  25 cc      Total IV Fluids: see I&O flowsheet         Specimens: bilateral tubes and ovaries, pelvic washings         Complications:  None apparent; patient tolerated the procedure well.         Disposition: PACU - hemodynamically stable.  Procedure Details  The patient was seen in the Holding Room. The risks, benefits, complications, treatment options, and expected outcomes were discussed with the patient.  The patient concurred with the proposed plan, giving informed consent.  The site of surgery properly noted/marked. The patient was identified as Tina Nielsen and the procedure verified as a Robotic-assisted bilateral salpingo-oophorectomy with any other indicated procedures.   After induction of anesthesia, the patient was draped and prepped in the usual sterile manner. Patient was placed in supine position after anesthesia and draped and prepped in the usual sterile manner as follows: Her arms were tucked to her side with all appropriate precautions.  The patient was  placed in the semi-lithotomy position in [place] stirrups.  The patient was secured to the bed with padding and tape across her chest. The perineum and vagina were prepped with CHG. The patient's abdomen was prepped with ChloraPrep and then she was draped after the prep had been allowed to dry for 3 minutes.  A Time Out was held and the above information confirmed.  The urethra was prepped with Betadine. Foley catheter was placed. OG tube placement was confirmed and to suction.   Next, a 10 mm skin incision was made 1 cm below the subcostal margin in the midclavicular line.  The 5 mm Optiview port and scope was used for direct entry.  Opening pressure was under 10 mm CO2.  The abdomen was insufflated and the findings were noted as above.   At this point and all points during the procedure, the patient's intra-abdominal pressure did not exceed 15 mmHg. Next, an 8 mm skin incision was made superior to the umbilicus and a right and left port were placed about 8 cm lateral to the robot port on the right and left side.   The 5 mm assist trocar was exchanged for a 12 mm airseal port. All ports were placed under direct visualization.  The patient was placed in steep Trendelenburg.  Bowel was folded away into the upper abdomen.  The robot was docked in the normal manner.  Pelvic washings were obtained.  The right and left peritoneum were opened parallel to the IP ligament to open the retroperitoneal spaces bilaterally. The round ligaments were preserved. The ureter was noted to be on the medial leaf of the broad ligament.  The  peritoneum above the ureter was incised and stretched and the infundibulopelvic ligament was skeletonized, cauterized and cut.  The utero-ovarian ligament and fallopian tube were skeletonized, cauterized and transected just lateral to the uterine fundus, freeing the adnexa. Each adnexa was placed in an Endocatch bag.   Irrigation was used and excellent hemostasis was achieved.  At this point  in the procedure was completed.  Robotic instruments were removed under direct visulaization.  The robot was undocked. The left adnexa was delivered through the left upper abdominal incision. The endocatch bag with the right adnexa was brought up to and through the LUQ incision. The cyst was decompressed in a contained fashion within the bag to allow for the adnexa to be delivered through the incision still in the bag. This was sent to pathology for frozen section.  Once pathology called with results, the fascia at the 10-12 mm port was closed with 0 Vicryl using a PMI fascial closure device under direct visualization.  The subcuticular tissue was closed with 4-0 Vicryl and the skin was closed with 4-0 Monocryl in a subcuticular manner.  Dermabond was applied.    Foley catheter was removed.  All sponge, lap and needle counts were correct x  3.   The patient was transferred to the recovery room in stable condition.  Comer Dollar, MD

## 2024-07-30 NOTE — Discharge Instructions (Signed)

## 2024-07-30 NOTE — Anesthesia Preprocedure Evaluation (Signed)
"                                    Anesthesia Evaluation  Patient identified by MRN, date of birth, ID band Patient awake    Reviewed: Allergy & Precautions, NPO status , Patient's Chart, lab work & pertinent test results  Airway Mallampati: II  TM Distance: >3 FB Neck ROM: Full    Dental  (+) Dental Advisory Given   Pulmonary former smoker   breath sounds clear to auscultation       Cardiovascular negative cardio ROS  Rhythm:Regular Rate:Normal     Neuro/Psych negative neurological ROS     GI/Hepatic negative GI ROS, Neg liver ROS,,,  Endo/Other  negative endocrine ROS    Renal/GU negative Renal ROS     Musculoskeletal  (+) Arthritis ,    Abdominal   Peds  Hematology negative hematology ROS (+)   Anesthesia Other Findings   Reproductive/Obstetrics                              Anesthesia Physical Anesthesia Plan  ASA: 2  Anesthesia Plan: General   Post-op Pain Management: Tylenol  PO (pre-op)*, Lidocaine  infusion* and Toradol IV (intra-op)*   Induction: Intravenous  PONV Risk Score and Plan: 3 and Dexamethasone , Ondansetron  and Treatment may vary due to age or medical condition  Airway Management Planned: Oral ETT  Additional Equipment: None  Intra-op Plan:   Post-operative Plan: Extubation in OR  Informed Consent: I have reviewed the patients History and Physical, chart, labs and discussed the procedure including the risks, benefits and alternatives for the proposed anesthesia with the patient or authorized representative who has indicated his/her understanding and acceptance.     Dental advisory given  Plan Discussed with: CRNA  Anesthesia Plan Comments:         Anesthesia Quick Evaluation  "

## 2024-07-30 NOTE — Transfer of Care (Signed)
 Immediate Anesthesia Transfer of Care Note  Patient: Tina Nielsen  Procedure(s) Performed: SALPINGO-OOPHORECTOMY, BILATERAL, ROBOT-ASSISTED (Bilateral)  Patient Location: PACU  Anesthesia Type:General  Level of Consciousness: awake, alert , and oriented  Airway & Oxygen Therapy: Patient Spontanous Breathing and Patient connected to face mask oxygen  Post-op Assessment: Report given to RN and Post -op Vital signs reviewed and stable  Post vital signs: Reviewed and stable  Last Vitals:  Vitals Value Taken Time  BP 166/85 07/30/24 10:40  Temp    Pulse 78 07/30/24 10:42  Resp 18 07/30/24 10:42  SpO2 100 % 07/30/24 10:42  Vitals shown include unfiled device data.  Last Pain:  Vitals:   07/30/24 0808  TempSrc: Oral  PainSc:          Complications: No notable events documented.

## 2024-07-30 NOTE — Anesthesia Procedure Notes (Signed)
 Procedure Name: Intubation Date/Time: 07/30/2024 9:21 AM  Performed by: Fabyan Loughmiller D, CRNAPre-anesthesia Checklist: Patient identified, Emergency Drugs available, Suction available and Patient being monitored Patient Re-evaluated:Patient Re-evaluated prior to induction Oxygen Delivery Method: Circle system utilized Preoxygenation: Pre-oxygenation with 100% oxygen Induction Type: IV induction Ventilation: Mask ventilation without difficulty Laryngoscope Size: [name] and 3 Grade View: Grade I Tube type: Oral Tube size: 7.0 mm Number of attempts: 1 Airway Equipment and Method: Stylet and Oral airway Placement Confirmation: ETT inserted through vocal cords under direct vision, positive ETCO2 and breath sounds checked- equal and bilateral Secured at: 20 cm Tube secured with: Tape Dental Injury: Teeth and Oropharynx as per pre-operative assessment

## 2024-07-31 ENCOUNTER — Encounter (HOSPITAL_COMMUNITY): Payer: Self-pay | Admitting: Gynecologic Oncology

## 2024-08-01 ENCOUNTER — Ambulatory Visit: Payer: Self-pay | Admitting: Gynecologic Oncology

## 2024-08-01 ENCOUNTER — Telehealth: Payer: Self-pay

## 2024-08-01 LAB — CYTOLOGY - NON PAP

## 2024-08-01 LAB — SURGICAL PATHOLOGY

## 2024-08-01 NOTE — Telephone Encounter (Signed)
 Spoke with Tina Nielsen this morning. She states she is eating, drinking and urinating well. She has not had a BM yet but is passing gas. She is taking senokot as prescribed and encouraged her to drink plenty of water. She denies fever or chills. Incisions are dry and intact. She rates her pain 2/10. Her pain is controlled with Ibuprofen    Instructed to call office with any fever, chills, purulent drainage, uncontrolled pain or any other questions or concerns. Patient verbalizes understanding.   Pt aware of post op appointments as well as the office number [phone] and after hours number [phone] to call if she has any questions or concerns

## 2024-08-01 NOTE — Anesthesia Postprocedure Evaluation (Signed)
"   Anesthesia Post Note  Patient: Tina Nielsen  Procedure(s) Performed: SALPINGO-OOPHORECTOMY, BILATERAL, ROBOT-ASSISTED (Bilateral)     Patient location during evaluation: PACU Anesthesia Type: General Level of consciousness: awake and alert Pain management: pain level controlled Vital Signs Assessment: post-procedure vital signs reviewed and stable Respiratory status: spontaneous breathing, nonlabored ventilation, respiratory function stable and patient connected to nasal cannula oxygen Cardiovascular status: blood pressure returned to baseline and stable Postop Assessment: no apparent nausea or vomiting Anesthetic complications: no   No notable events documented.  Last Vitals:  Vitals:   07/30/24 1139 07/30/24 1200  BP: (!) 159/83 (!) 149/73  Pulse: 68 63  Resp: 13 15  Temp:  36.4 C  SpO2: 96% 98%    Last Pain:  Vitals:   07/30/24 1200  TempSrc:   PainSc: 3                  Epifanio Lamar BRAVO      "

## 2024-08-01 NOTE — Telephone Encounter (Signed)
 LVM for Tina Nielsen to call the office regarding post op follow up call.

## 2024-08-13 ENCOUNTER — Telehealth: Payer: Self-pay

## 2024-08-13 DIAGNOSIS — N83201 Unspecified ovarian cyst, right side: Secondary | ICD-10-CM

## 2024-08-13 DIAGNOSIS — Z170 Estrogen receptor positive status [ER+]: Secondary | ICD-10-CM

## 2024-08-13 NOTE — Telephone Encounter (Signed)
 Pt called to find out if she needs to keep MD visit with Dr Loretha since her surg path was benign. Pt was advised per MD that her MRI was benign as well but recommends 6 mo MRI f/u, This was ordered per MD and pt is agreeable. She plans to f/u in oct 2026 but will f/u sooner if indicated.

## 2024-08-15 ENCOUNTER — Encounter: Payer: Self-pay | Admitting: Hematology and Oncology

## 2024-08-18 ENCOUNTER — Encounter: Payer: Self-pay | Admitting: Hematology and Oncology

## 2024-08-21 ENCOUNTER — Other Ambulatory Visit

## 2024-08-22 ENCOUNTER — Inpatient Hospital Stay: Admitting: Hematology and Oncology

## 2024-08-29 ENCOUNTER — Inpatient Hospital Stay: Attending: Hematology and Oncology | Admitting: Gynecologic Oncology

## 2024-08-29 ENCOUNTER — Encounter: Payer: Self-pay | Admitting: Gynecologic Oncology

## 2024-08-29 VITALS — BP 135/65 | HR 72 | Temp 98.0°F | Resp 16 | Ht 64.0 in | Wt 130.0 lb

## 2024-08-29 DIAGNOSIS — Z90722 Acquired absence of ovaries, bilateral: Secondary | ICD-10-CM

## 2024-08-29 DIAGNOSIS — Z9079 Acquired absence of other genital organ(s): Secondary | ICD-10-CM

## 2024-08-29 DIAGNOSIS — N83209 Unspecified ovarian cyst, unspecified side: Secondary | ICD-10-CM

## 2024-08-29 DIAGNOSIS — N8353 Torsion of ovary, ovarian pedicle and fallopian tube: Secondary | ICD-10-CM

## 2024-08-29 NOTE — Progress Notes (Signed)
 Gynecologic Oncology Return Clinic Visit  08/29/24  Reason for Visit: follow-up  Treatment History: Oncology History  Malignant neoplasm of upper-outer quadrant of right breast in female, estrogen receptor positive (HCC)  08/17/2015 Mammogram   Dx imaging to evaluate palpable mass - no correlate; however, right breast upper outer quadrant posteriorly there was a 4 mm area of grouped calcifications.   09/01/2015 Initial Biopsy   Right breast bx ([facility]): invasive ductal carcinoma, grade 1, ER/PR+, HER2/neu negative, Ki67 10%   09/06/2015 Breast MRI   Susceptibility artifact within the upper-outer right breast compatible with biopsy marking clip. No significant surrounding enhancement is identified at the site of recently biopsied right breast malignancy.   09/06/2015 Clinical Stage   Stage IA: T1a N0   09/30/2015 Definitive Surgery   Right lumpectomy/SLNB: DCIS, grade 2, negative margins, ER/PR+, no residual invasive dz. 0/3 LN   09/30/2015 Pathologic Stage   Stage 0: Tis N0   10/19/2015 Procedure   Breast/Ovarian panel: likely pathogenic variant in CHEK2 c.1427C>T; VUS in BRCA2 gene [id number].  Otherwise neg at ATM, BARD1, BRCA1, BRCA2, BRIP1, CDH1, EPCAM, FANCC, MLH1, MSH2, MSH6, NBN, PALB2, PMS2, PTEN, RAD51C, RAD51D, TP53, XRCC2   11/03/2015 - 11/24/2015 Radiation Therapy   Adjuvant RT to right breast: 42.720 Gy in 16 fractions   01/06/2016 -  Anti-estrogen oral therapy   Tamoxifen  20 mg daily   02/03/2016 Survivorship   [facility] visit completed    Patient presented to the emergency department on 12/9 with right lower quadrant pain that worsened over the course of the day and became associated with 3 episodes of emesis.  CT of the abdomen and pelvis was performed during that emergency department visit and notable for a 6 cm cystic lesion in the right adnexa with adjacent soft tissue density, which may represent solid nodule.  No peritoneal thickening, omental caking, ascites, or  adenopathy. Given reassuring exam, plan was made for outpatient workup and treatment. MRI Pelvis on 07/24/2024: Uterus measures 8.3 cm, normal in morphology.  Nabothian cyst noted.  Exophytic off of the inferior right ovary is a simple cystic lesion measuring up to 5.7 cm.  No postcontrast enhancement or restricted diffusion.  No other concerning features.  Left ovary normal in appearance.  07/30/24: Robotic-assisted laparoscopic bilateral salpingo-oophorectomy   Interval History: Doing very well.  Denies any abdominal pain.  Reports good bowel function.  Returned to work after the new year.  Past Medical/Surgical History: Past Medical History:  Diagnosis Date   Anxiety    Arthritis    knees   Atrophic vaginitis    Breast cancer of upper-outer quadrant of right female breast (HCC) 09/03/2015   Family history of breast cancer    High cholesterol    Osteopenia     Past Surgical History:  Procedure Laterality Date   BREAST LUMPECTOMY WITH NEEDLE LOCALIZATION AND AXILLARY SENTINEL LYMPH NODE BX Right 09/30/2015   Procedure: RIGHT BREAST LUMPECTOMY WITH NEEDLE LOCALIZATION AND AXILLARY SENTINEL LYMPH NODE BX;  Surgeon: Donnice Bury, MD;  Location: [facility];  Service: General;  Laterality: Right;   BREAST SURGERY     BIOPSY   cataract surg Right    KNEE SURGERY     ROBOTIC ASSISTED BILATERAL SALPINGO OOPHERECTOMY Bilateral 07/30/2024   Procedure: SALPINGO-OOPHORECTOMY, BILATERAL, ROBOT-ASSISTED;  Surgeon: Viktoria Comer SAUNDERS, MD;  Location: [facility];  Service: Gynecology;  Laterality: Bilateral;   TUBAL LIGATION      Family History  Problem Relation Age of Onset   Hypertension  Mother    Breast cancer Mother 67       second cancer at 78   Heart disease Mother    Aneurysm Father        AORTIC   Kidney cancer Maternal Aunt 68   Breast cancer Paternal Aunt    Prostate cancer Paternal Uncle 72   Stroke Maternal Grandmother    Heart disease Maternal Grandfather     Breast cancer Other        MGMs sister   Breast cancer Other        PGMs sister   Breast cancer Other        PGF sister   Colon cancer Neg Hx     Social History   Socioeconomic History   Marital status: Married    Spouse name: Ryan   Number of children: 2   Years of education: Not on file   Highest education level: Not on file  Occupational History   Not on file  Tobacco Use   Smoking status: Former    Current packs/day: 0.00    Types: Cigarettes    Quit date: 08/07/1976    Years since quitting: 48.0   Smokeless tobacco: Never  Vaping Use   Vaping status: Never Used  Substance and Sexual Activity   Alcohol use: Yes    Alcohol/week: 7.0 standard drinks of alcohol    Types: 7 Glasses of wine per week    Comment: 1 each night   Drug use: No   Sexual activity: Not Currently    Birth control/protection: Surgical    Comment: 1st intercourse 74 yo-Fewer than 5 partners  Other Topics Concern   Not on file  Social History Narrative   Not on file   Social Drivers of Health   Tobacco Use: Medium Risk (08/29/2024)   Patient History    Smoking Tobacco Use: Former    Smokeless Tobacco Use: Never    Passive Exposure: Not on [place]uary Strain: Not on file  Food Insecurity: Not on file  Transportation Needs: Not on file  Physical Activity: Not on file  Stress: Not on file  Social Connections: Not on file  Depression (EYV7-0): Not on file  Alcohol Screen: Not on file  Housing: Not on file  Utilities: Not on file  Health Literacy: Not on file    Current Medications: Current Medications[1]  Review of Systems: Denies appetite changes, fevers, chills, fatigue, unexplained weight changes. Denies hearing loss, neck lumps or masses, mouth sores, ringing in ears or voice changes. Denies cough or wheezing.  Denies shortness of breath. Denies chest pain or palpitations. Denies leg swelling. Denies abdominal distention, pain, blood in stools, constipation,  diarrhea, nausea, vomiting, or early satiety. Denies pain with intercourse, dysuria, frequency, hematuria or incontinence. Denies hot flashes, pelvic pain, vaginal bleeding or vaginal discharge.   Denies joint pain, back pain or muscle pain/cramps. Denies itching, rash, or wounds. Denies dizziness, headaches, numbness or seizures. Denies swollen lymph nodes or glands, denies easy bruising or bleeding. Denies anxiety, depression, confusion, or decreased concentration.  Physical Exam: BP 135/65 (BP Location: Left Arm, Patient Position: Sitting)   Pulse 72   Temp 98 F (36.7 C)   Resp 16   Ht 5' 4 (1.626 m)   Wt 130 lb (59 kg)   BMI 22.31 kg/m  General: Alert, oriented, no acute distress. HEENT: Posterior oropharynx clear, sclera anicteric. Chest: Unlabored breathing on room air. Abdomen: soft, nontender.  Normoactive bowel sounds.  No  masses or hepatosplenomegaly appreciated.  Well-healed incisions.  Laboratory & Radiologic Studies: A. FALLOPIAN TUBE, OVARY, RIGHT, SALPINGO OOPHORECTOMY:       Benign fimbriated fallopian tube without significant diagnostic  alteration.      Benign ovarian parenchyma with inclusion cysts.   B. FALLOPIAN TUBE, OVARY, LEFT, SALPINGO OOPHORECTOMY:       Benign fimbriated fallopian tube without significant diagnostic  alteration.      Benign ovarian parenchyma with inclusion cysts.   Cytology: No malignant cells  Assessment & Plan: Tina Nielsen is a 74 y.o. woman s/p robotic bilateral salpingo-oophorectomy for an adnexal mass in the setting of torsion.  Patient is overall doing well from a postoperative standpoint.  Discussed continued expectations and restrictions.  Reviewed pathology with her from surgery.  She was given a copy of the pathology report.  She will be discharged back to her other medical providers.  10 minutes of total time was spent for this patient encounter, including preparation, face-to-face counseling with the patient  and coordination of care, and documentation of the encounter.  Comer Dollar, MD  Division of Gynecologic Oncology  Department of Obstetrics and Gynecology  [facility]      [1]  Current Outpatient Medications:    atorvastatin (LIPITOR) 80 MG tablet, Take 80 mg by mouth daily., Disp: , Rfl:    Calcium Carb-Cholecalciferol (CALCIUM HIGH POTENCY/VITAMIN D) 600-5 MG-MCG TABS, Take 1 tablet by mouth daily., Disp: , Rfl:    Calcium Carbonate-Vitamin D (CALCIUM + D PO), Take 1 tablet by mouth daily., Disp: , Rfl:    Cholecalciferol (VITAMIN D-3 PO), Take 1 tablet by mouth daily., Disp: , Rfl:    escitalopram  (LEXAPRO ) 10 MG tablet, Take 1 tablet by mouth daily., Disp: , Rfl:    fluticasone (FLONASE) 50 MCG/ACT nasal spray, Place 1 spray into both nostrils daily as needed for allergies., Disp: , Rfl:    Multiple Vitamins-Minerals (ONE A DAY WOMEN 50 PLUS PO), Take by mouth., Disp: , Rfl:

## 2024-08-29 NOTE — Patient Instructions (Signed)
 It was good to see you today.  You are healing very well from surgery!  Please keep listening to your body.  You are cleared to do anything as long as it does not hurt. Please do not hesitate to reach out to my office in the future if you need anything.

## 2024-09-05 ENCOUNTER — Encounter: Payer: Self-pay | Admitting: Hematology and Oncology

## 2024-09-15 ENCOUNTER — Other Ambulatory Visit

## 2025-05-22 ENCOUNTER — Ambulatory Visit: Admitting: Hematology and Oncology
